# Patient Record
Sex: Male | Born: 1960
Health system: Southern US, Community
[De-identification: ages and names within clinical notes are randomized; demographics above are authoritative.]

## PROBLEM LIST (undated history)

## (undated) DIAGNOSIS — K7460 Unspecified cirrhosis of liver: Secondary | ICD-10-CM

## (undated) DIAGNOSIS — D696 Thrombocytopenia, unspecified: Secondary | ICD-10-CM

## (undated) DIAGNOSIS — I10 Essential (primary) hypertension: Secondary | ICD-10-CM

## (undated) DIAGNOSIS — M1990 Unspecified osteoarthritis, unspecified site: Secondary | ICD-10-CM

## (undated) DIAGNOSIS — K754 Autoimmune hepatitis: Secondary | ICD-10-CM

## (undated) HISTORY — PX: OTHER SURGICAL HISTORY: SHX169

## (undated) HISTORY — DX: Autoimmune hepatitis: K75.4

## (undated) HISTORY — DX: Thrombocytopenia, unspecified: D69.6

## (undated) HISTORY — DX: Essential (primary) hypertension: I10

## (undated) HISTORY — DX: Unspecified cirrhosis of liver: K74.60

## (undated) SURGERY — COLONOSCOPY
Anesthesia: Monitor Anesthesia Care

---

## 1998-10-09 ENCOUNTER — Emergency Department (HOSPITAL_COMMUNITY): Admission: EM | Admit: 1998-10-09 | Discharge: 1998-10-09 | Payer: Self-pay | Admitting: Emergency Medicine

## 1999-05-06 ENCOUNTER — Ambulatory Visit (HOSPITAL_COMMUNITY): Admission: RE | Admit: 1999-05-06 | Discharge: 1999-05-06 | Payer: Self-pay | Admitting: *Deleted

## 1999-12-06 ENCOUNTER — Encounter: Payer: Self-pay | Admitting: Emergency Medicine

## 1999-12-06 ENCOUNTER — Emergency Department (HOSPITAL_COMMUNITY): Admission: EM | Admit: 1999-12-06 | Discharge: 1999-12-06 | Payer: Self-pay | Admitting: Emergency Medicine

## 2003-02-04 ENCOUNTER — Encounter: Payer: Self-pay | Admitting: Emergency Medicine

## 2003-02-04 ENCOUNTER — Emergency Department (HOSPITAL_COMMUNITY): Admission: EM | Admit: 2003-02-04 | Discharge: 2003-02-04 | Payer: Self-pay | Admitting: Emergency Medicine

## 2004-12-16 ENCOUNTER — Emergency Department (HOSPITAL_COMMUNITY): Admission: EM | Admit: 2004-12-16 | Discharge: 2004-12-16 | Payer: Self-pay | Admitting: Emergency Medicine

## 2005-03-04 ENCOUNTER — Emergency Department (HOSPITAL_COMMUNITY): Admission: EM | Admit: 2005-03-04 | Discharge: 2005-03-04 | Payer: Self-pay | Admitting: Emergency Medicine

## 2005-07-24 ENCOUNTER — Emergency Department (HOSPITAL_COMMUNITY): Admission: EM | Admit: 2005-07-24 | Discharge: 2005-07-24 | Payer: Self-pay | Admitting: Emergency Medicine

## 2007-01-12 ENCOUNTER — Encounter: Admission: RE | Admit: 2007-01-12 | Discharge: 2007-01-12 | Payer: Self-pay | Admitting: Internal Medicine

## 2007-02-07 ENCOUNTER — Encounter: Admission: RE | Admit: 2007-02-07 | Discharge: 2007-02-07 | Payer: Self-pay | Admitting: Internal Medicine

## 2007-12-23 ENCOUNTER — Ambulatory Visit: Payer: Self-pay | Admitting: Gastroenterology

## 2007-12-23 DIAGNOSIS — B1910 Unspecified viral hepatitis B without hepatic coma: Secondary | ICD-10-CM | POA: Insufficient documentation

## 2007-12-23 DIAGNOSIS — R7401 Elevation of levels of liver transaminase levels: Secondary | ICD-10-CM | POA: Insufficient documentation

## 2007-12-23 DIAGNOSIS — R740 Nonspecific elevation of levels of transaminase and lactic acid dehydrogenase [LDH]: Secondary | ICD-10-CM

## 2007-12-26 LAB — CONVERTED CEMR LAB
AST: 1099 units/L — ABNORMAL HIGH (ref 0–37)
Alkaline Phosphatase: 164 units/L — ABNORMAL HIGH (ref 39–117)
Basophils Relative: 0.1 % (ref 0.0–1.0)
Bilirubin, Direct: 5.4 mg/dL — ABNORMAL HIGH (ref 0.0–0.3)
CO2: 27 meq/L (ref 19–32)
Eosinophils Relative: 8.4 % — ABNORMAL HIGH (ref 0.0–5.0)
Hemoglobin: 13.9 g/dL (ref 13.0–17.0)
INR: 1.4 — ABNORMAL HIGH (ref 0.8–1.0)
Lymphocytes Relative: 44 % (ref 12.0–46.0)
Monocytes Relative: 12.8 % — ABNORMAL HIGH (ref 3.0–12.0)
Neutro Abs: 1.9 10*3/uL (ref 1.4–7.7)
Prothrombin Time: 15.8 s — ABNORMAL HIGH (ref 10.9–13.3)
RBC: 4.73 M/uL (ref 4.22–5.81)
Total Bilirubin: 8.8 mg/dL — ABNORMAL HIGH (ref 0.3–1.2)
aPTT: 32.7 s — ABNORMAL HIGH (ref 21.7–29.8)

## 2008-01-02 LAB — CONVERTED CEMR LAB
AFP-Tumor Marker: 20.1 ng/mL — ABNORMAL HIGH (ref 0.0–8.0)
Anti Nuclear Antibody(ANA): POSITIVE — AB

## 2008-01-03 ENCOUNTER — Telehealth: Payer: Self-pay | Admitting: Gastroenterology

## 2008-01-05 ENCOUNTER — Ambulatory Visit (HOSPITAL_COMMUNITY): Admission: RE | Admit: 2008-01-05 | Discharge: 2008-01-05 | Payer: Self-pay | Admitting: Gastroenterology

## 2008-01-05 ENCOUNTER — Ambulatory Visit: Payer: Self-pay | Admitting: Gastroenterology

## 2008-01-05 ENCOUNTER — Telehealth (INDEPENDENT_AMBULATORY_CARE_PROVIDER_SITE_OTHER): Payer: Self-pay

## 2008-01-11 ENCOUNTER — Ambulatory Visit: Payer: Self-pay | Admitting: Gastroenterology

## 2008-01-11 LAB — CONVERTED CEMR LAB
Albumin ELP: 45.8 % — ABNORMAL LOW (ref 55.8–66.1)
Alpha-1-Globulin: 3.4 % (ref 2.9–4.9)
Beta Globulin: 3.8 % — ABNORMAL LOW (ref 4.7–7.2)
EBV NA IgG: 3.29 — ABNORMAL HIGH
EBV VCA IgM: 0.76
Gamma Globulin: 35.9 % — ABNORMAL HIGH (ref 11.1–18.8)

## 2008-01-12 ENCOUNTER — Encounter: Payer: Self-pay | Admitting: Gastroenterology

## 2008-01-12 ENCOUNTER — Telehealth: Payer: Self-pay | Admitting: Gastroenterology

## 2008-01-12 LAB — CONVERTED CEMR LAB
ALT: 495 units/L — ABNORMAL HIGH (ref 0–53)
AST: 700 units/L — ABNORMAL HIGH (ref 0–37)
Albumin: 2.3 g/dL — ABNORMAL LOW (ref 3.5–5.2)
Alkaline Phosphatase: 152 units/L — ABNORMAL HIGH (ref 39–117)
BUN: 11 mg/dL (ref 6–23)
Basophils Relative: 2 % — ABNORMAL HIGH (ref 0.0–1.0)
Bilirubin, Direct: 10.5 mg/dL — ABNORMAL HIGH (ref 0.0–0.3)
CO2: 25 meq/L (ref 19–32)
Calcium: 8.9 mg/dL (ref 8.4–10.5)
Chloride: 109 meq/L (ref 96–112)
Creatinine, Ser: 0.9 mg/dL (ref 0.4–1.5)
Eosinophils Relative: 14 % — ABNORMAL HIGH (ref 0.0–5.0)
Ferritin: 699.7 ng/mL — ABNORMAL HIGH (ref 22.0–322.0)
GFR calc Af Amer: 116 mL/min
GFR calc non Af Amer: 96 mL/min
Glucose, Bld: 120 mg/dL — ABNORMAL HIGH (ref 70–99)
HCT: 35.9 % — ABNORMAL LOW (ref 39.0–52.0)
Hemoglobin: 12.5 g/dL — ABNORMAL LOW (ref 13.0–17.0)
INR: 1.5 — ABNORMAL HIGH (ref 0.8–1.0)
Iron: 127 ug/dL (ref 42–165)
Lymphocytes Relative: 36 % (ref 12.0–46.0)
MCHC: 34.7 g/dL (ref 30.0–36.0)
MCV: 85 fL (ref 78.0–100.0)
Monocytes Relative: 6 % (ref 3.0–12.0)
Neutrophils Relative %: 42 % — ABNORMAL LOW (ref 43.0–77.0)
Platelets: 82 10*3/uL — ABNORMAL LOW (ref 150–400)
Potassium: 3.8 meq/L (ref 3.5–5.1)
Prothrombin Time: 17.3 s — ABNORMAL HIGH (ref 10.9–13.3)
RBC: 4.23 M/uL (ref 4.22–5.81)
RDW: 22.7 % — ABNORMAL HIGH (ref 11.5–14.6)
Saturation Ratios: 54.4 % — ABNORMAL HIGH (ref 20.0–50.0)
Sodium: 137 meq/L (ref 135–145)
TSH: 1.63 microintl units/mL (ref 0.35–5.50)
Total Bilirubin: 18.3 mg/dL — ABNORMAL HIGH (ref 0.3–1.2)
Total Protein: 7.4 g/dL (ref 6.0–8.3)
Transferrin: 166.8 mg/dL — ABNORMAL LOW (ref >=212.0)
WBC: 5.8 10*3/uL (ref 4.5–10.5)
aPTT: 31.6 s — ABNORMAL HIGH (ref 21.7–29.8)

## 2008-01-13 ENCOUNTER — Telehealth (INDEPENDENT_AMBULATORY_CARE_PROVIDER_SITE_OTHER): Payer: Self-pay | Admitting: *Deleted

## 2008-01-17 ENCOUNTER — Telehealth: Payer: Self-pay | Admitting: Gastroenterology

## 2008-01-17 ENCOUNTER — Telehealth (INDEPENDENT_AMBULATORY_CARE_PROVIDER_SITE_OTHER): Payer: Self-pay | Admitting: *Deleted

## 2008-01-18 ENCOUNTER — Encounter (INDEPENDENT_AMBULATORY_CARE_PROVIDER_SITE_OTHER): Payer: Self-pay | Admitting: *Deleted

## 2008-01-18 ENCOUNTER — Telehealth (INDEPENDENT_AMBULATORY_CARE_PROVIDER_SITE_OTHER): Payer: Self-pay | Admitting: *Deleted

## 2008-01-30 ENCOUNTER — Telehealth: Payer: Self-pay | Admitting: Gastroenterology

## 2008-01-30 ENCOUNTER — Telehealth (INDEPENDENT_AMBULATORY_CARE_PROVIDER_SITE_OTHER): Payer: Self-pay | Admitting: *Deleted

## 2008-02-07 ENCOUNTER — Encounter: Admission: RE | Admit: 2008-02-07 | Discharge: 2008-02-07 | Payer: Self-pay | Admitting: Nephrology

## 2010-05-08 ENCOUNTER — Emergency Department (HOSPITAL_COMMUNITY): Admission: EM | Admit: 2010-05-08 | Discharge: 2010-05-08 | Payer: Self-pay | Admitting: Emergency Medicine

## 2010-07-27 ENCOUNTER — Encounter: Payer: Self-pay | Admitting: Gastroenterology

## 2012-11-15 ENCOUNTER — Ambulatory Visit: Payer: BC Managed Care – PPO | Attending: Family Medicine | Admitting: Family Medicine

## 2012-11-15 VITALS — BP 146/84 | HR 73 | Temp 98.1°F | Resp 16 | Ht 72.5 in | Wt 233.0 lb

## 2012-11-15 DIAGNOSIS — K754 Autoimmune hepatitis: Secondary | ICD-10-CM

## 2012-11-15 DIAGNOSIS — I10 Essential (primary) hypertension: Secondary | ICD-10-CM | POA: Insufficient documentation

## 2012-11-15 MED ORDER — AZATHIOPRINE 50 MG PO TABS: 50.0000 mg | ORAL_TABLET | Freq: Every day | ORAL | Status: DC

## 2012-11-15 MED ORDER — AMLODIPINE BESYLATE 5 MG PO TABS: 5.0000 mg | ORAL_TABLET | Freq: Every day | ORAL | Status: DC

## 2012-11-15 NOTE — Progress Notes (Signed)
  Subjective:    Patient ID: Timothy [name], male    DOB: Mar 10, 1961, 52 y.o.   MRN: [id number]  Hypertension This is a chronic problem. The current episode started more than 1 year ago. The problem is unchanged (worse for 5 days, out of meds x 5 days). The problem is uncontrolled. Pertinent negatives include no chest pain, headaches, palpitations or shortness of breath. There are no associated agents to hypertension. Risk factors for coronary artery disease include obesity and male gender. Past treatments include calcium channel blockers. The current treatment provides moderate improvement.      Review of Systems  Respiratory: Negative for shortness of breath.   Cardiovascular: Negative for chest pain, palpitations and leg swelling.  Neurological: Negative for headaches.  All other systems reviewed and are negative.       Objective:   Physical Exam  Nursing note and vitals reviewed. Constitutional: He is oriented to person, place, and time. He appears well-developed and well-nourished. No distress.  HENT:  Head: Normocephalic and atraumatic.  Nose: Nose normal.  Mouth/Throat: No oropharyngeal exudate.  Poor dentition  Eyes: Conjunctivae and EOM are normal. Pupils are equal, round, and reactive to light. No scleral icterus.  Neck: Normal range of motion. Neck supple. No tracheal deviation present. No thyromegaly present.  Cardiovascular: Normal rate, regular rhythm and normal heart sounds.   Pulmonary/Chest: Effort normal and breath sounds normal.  Abdominal: Soft. Bowel sounds are normal. He exhibits no distension and no mass. There is no tenderness. There is no guarding.  Musculoskeletal: Normal range of motion. He exhibits no edema and no tenderness.  Neurological: He is alert and oriented to person, place, and time. He has normal reflexes.  Skin: Skin is warm and dry. No erythema.  Psychiatric: He has a normal mood and affect. His behavior is normal. Judgment and thought content  normal.         Assessment & Plan:  Unspecified essential hypertension - Plan: Obtain medical records, amLODipine (NORVASC) 5 MG tablet  Essential hypertension, benign - Plan: CBC with Differential, Comprehensive metabolic panel, Lipid Panel, Hemoglobin A1C, Vitamin D, 25-hydroxy, Urinalysis Dipstick  Autoimmune hepatitis - Plan: azaTHIOprine (IMURAN) 50 MG tablet   Refilled medications today and sent for old records.   Follow lab results.  The patient was given clear instructions to go to ER or return to medical center if symptoms don't improve, worsen or new problems develop.  The patient verbalized understanding.  The patient was told to call to get lab results if they haven't heard anything in the next week.     Rodney Langton, MD, CDE, FAAFP [facility] [place], [place]

## 2012-11-15 NOTE — Progress Notes (Signed)
Patient stated that he would have to check his work schedule before scheduling return visit in 6 weeks.

## 2012-11-15 NOTE — Patient Instructions (Signed)
Arterial Hypertension Arterial hypertension (high blood pressure) is a condition of elevated pressure in your blood vessels. Hypertension over a long period of time is a risk factor for strokes, heart attacks, and heart failure. It is also the leading cause of kidney (renal) failure.  CAUSES   In Adults -- Over 90% of all hypertension has no known cause. This is called essential or primary hypertension. In the other 10% of people with hypertension, the increase in blood pressure is caused by another disorder. This is called secondary hypertension. Important causes of secondary hypertension are:  Heavy alcohol use.  Obstructive sleep apnea.  Hyperaldosterosim (Conn's syndrome).  Steroid use.  Chronic kidney failure.  Hyperparathyroidism.  Medications.  Renal artery stenosis.  Pheochromocytoma.  Cushing's disease.  Coarctation of the aorta.  Scleroderma renal crisis.  Licorice (in excessive amounts).  Drugs (cocaine, methamphetamine). Your caregiver can explain any items above that apply to you.  In Children -- Secondary hypertension is more common and should always be considered.  Pregnancy -- Few women of childbearing age have high blood pressure. However, up to 10% of them develop hypertension of pregnancy. Generally, this will not harm the woman. It may be a sign of 3 complications of pregnancy: preeclampsia, HELLP syndrome, and eclampsia. Follow up and control with medication is necessary. SYMPTOMS   This condition normally does not produce any noticeable symptoms. It is usually found during a routine exam.  Malignant hypertension is a late problem of high blood pressure. It may have the following symptoms:  Headaches.  Blurred vision.  End-organ damage (this means your kidneys, heart, lungs, and other organs are being damaged).  Stressful situations can increase the blood pressure. If a person with normal blood pressure has their blood pressure go up while being  seen by their caregiver, this is often termed "white coat hypertension." Its importance is not known. It may be related with eventually developing hypertension or complications of hypertension.  Hypertension is often confused with mental tension, stress, and anxiety. DIAGNOSIS  The diagnosis is made by 3 separate blood pressure measurements. They are taken at least 1 week apart from each other. If there is organ damage from hypertension, the diagnosis may be made without repeat measurements. Hypertension is usually identified by having blood pressure readings:  Above 140/90 mmHg measured in both arms, at 3 separate times, over a couple weeks.  Over 130/80 mmHg should be considered a risk factor and may require treatment in patients with diabetes. Blood pressure readings over 120/80 mmHg are called "pre-hypertension" even in non-diabetic patients. To get a true blood pressure measurement, use the following guidelines. Be aware of the factors that can alter blood pressure readings.  Take measurements at least 1 hour after caffeine.  Take measurements 30 minutes after smoking and without any stress. This is another reason to quit smoking  it raises your blood pressure.  Use a proper cuff size. Ask your caregiver if you are not sure about your cuff size.  Most home blood pressure cuffs are automatic. They will measure systolic and diastolic pressures. The systolic pressure is the pressure reading at the start of sounds. Diastolic pressure is the pressure at which the sounds disappear. If you are elderly, measure pressures in multiple postures. Try sitting, lying or standing.  Sit at rest for a minimum of 5 minutes before taking measurements.  You should not be on any medications like decongestants. These are found in many cold medications.  Record your blood pressure readings and review  them with your caregiver. If you have hypertension:  Your caregiver may do tests to be sure you do not have  secondary hypertension (see "causes" above).  Your caregiver may also look for signs of metabolic syndrome. This is also called Syndrome X or Insulin Resistance Syndrome. You may have this syndrome if you have type 2 diabetes, abdominal obesity, and abnormal blood lipids in addition to hypertension.  Your caregiver will take your medical and family history and perform a physical exam.  Diagnostic tests may include blood tests (for glucose, cholesterol, potassium, and kidney function), a urinalysis, or an EKG. Other tests may also be necessary depending on your condition. PREVENTION  There are important lifestyle issues that you can adopt to reduce your chance of developing hypertension:  Maintain a normal weight.  Limit the amount of salt (sodium) in your diet.  Exercise often.  Limit alcohol intake.  Get enough potassium in your diet. Discuss specific advice with your caregiver.  Follow a DASH diet (dietary approaches to stop hypertension). This diet is rich in fruits, vegetables, and low-fat dairy products, and avoids certain fats. PROGNOSIS  Essential hypertension cannot be cured. Lifestyle changes and medical treatment can lower blood pressure and reduce complications. The prognosis of secondary hypertension depends on the underlying cause. Many people whose hypertension is controlled with medicine or lifestyle changes can live a normal, healthy life.  RISKS AND COMPLICATIONS  While high blood pressure alone is not an illness, it often requires treatment due to its short- and long-term effects on many organs. Hypertension increases your risk for:  CVAs or strokes (cerebrovascular accident).  Heart failure due to chronically high blood pressure (hypertensive cardiomyopathy).  Heart attack (myocardial infarction).  Damage to the retina (hypertensive retinopathy).  Kidney failure (hypertensive nephropathy). Your caregiver can explain list items above that apply to you. Treatment  of hypertension can significantly reduce the risk of complications. TREATMENT   For overweight patients, weight loss and regular exercise are recommended. Physical fitness lowers blood pressure.  Mild hypertension is usually treated with diet and exercise. A diet rich in fruits and vegetables, fat-free dairy products, and foods low in fat and salt (sodium) can help lower blood pressure. Decreasing salt intake decreases blood pressure in a 1/3 of people.  Stop smoking if you are a smoker. The steps above are highly effective in reducing blood pressure. While these actions are easy to suggest, they are difficult to achieve. Most patients with moderate or severe hypertension end up requiring medications to bring their blood pressure down to a normal level. There are several classes of medications for treatment. Blood pressure pills (antihypertensives) will lower blood pressure by their different actions. Lowering the blood pressure by 10 mmHg may decrease the risk of complications by as much as 25%. The goal of treatment is effective blood pressure control. This will reduce your risk for complications. Your caregiver will help you determine the best treatment for you according to your lifestyle. What is excellent treatment for one person, may not be for you. HOME CARE INSTRUCTIONS   Do not smoke.  Follow the lifestyle changes outlined in the "Prevention" section.  If you are on medications, follow the directions carefully. Blood pressure medications must be taken as prescribed. Skipping doses reduces their benefit. It also puts you at risk for problems.  Follow up with your caregiver, as directed.  If you are asked to monitor your blood pressure at home, follow the guidelines in the "Diagnosis" section above. SEEK MEDICAL CARE  IF:   You think you are having medication side effects.  You have recurrent headaches or lightheadedness.  You have swelling in your ankles.  You have trouble with  your vision. SEEK IMMEDIATE MEDICAL CARE IF:   You have sudden onset of chest pain or pressure, difficulty breathing, or other symptoms of a heart attack.  You have a severe headache.  You have symptoms of a stroke (such as sudden weakness, difficulty speaking, difficulty walking). MAKE SURE YOU:   Understand these instructions.  Will watch your condition.  Will get help right away if you are not doing well or get worse. Document Released: 06/22/2005 Document Revised: 09/14/2011 Document Reviewed: 01/20/2007 [facility] Patient Information 2013 [place], [place]. Autoimmune Hepatitis Autoimmune hepatitis is a disease in which the body's immune system attacks liver cells. The immune system is the system in your body which fights off disease. When the body's immune system attacks the liver, the liver becomes inflamed and causes hepatitis. Researchers think a genetic factor (passed from parent to child) may make it more likely for some people to have autoimmune diseases. About 70% of those with autoimmune hepatitis are women. Most people with autoimmune hepatitis are between the ages of 92 and 17, but the disease may happen at any age. The disease is usually serious. If not treated, it will get worse over time, and it can last for years. It can lead to scarring and hardening of the liver (cirrhosis), and it can lead to liver failure. Autoimmune hepatitis is classified as either type 1 or 2. Type 1 is the most common form in [place]. It occurs at any age and is more common among women. About half of those with type 1 have other autoimmune disorders, such as type 1 diabetes, proliferative glomerulonephritis, thyroiditis, Graves' disease, Sjgren's syndrome, autoimmune anemia, and ulcerative colitis. Your caregiver can tell you about these disorders and if they apply to you. Type 2 autoimmune hepatitis is less common. It usually affects girls ages 2 to 61, but adults can have it, too. AUTOIMMUNE  DISEASE One job of the immune system is to protect the body from viruses, bacteria, and other living organisms. Usually, the immune system does not react against the body's own cells. Occasionally, the immune system mistakenly attacks the cells it is supposed to protect, and when this happens, the response is called autoimmunity. Researchers think that certain bacteria, viruses, toxins, and drugs trigger an autoimmune response in people who are genetically susceptible to developing an autoimmune disorder. This means it could be a tendency you inherited from your parents. SYMPTOMS  Fatigue is probably the most common symptom of this disease. Other symptoms include:  Enlarged liver.  Jaundice.  Itching.  Skin rashes.  Joint pain.  Abdominal discomfort.  Abnormal blood vessels (spider angiomas) on the skin.  Nausea.  Vomiting.  Loss of appetite.  Dark urine.  Pale or gray colored stools. People in advanced stages of the disease are more likely to have symptoms such as fluid in the abdomen (ascites) or mental confusion. Women may stop having menstrual periods.  Symptoms of autoimmune hepatitis range from mild to severe. Hepatitis caused by a virus or certain medications may have similar symptoms as autoimmune hepatitis, so tests may be needed for an exact diagnosis. Your caregiver will also review all your medicines before diagnosing autoimmune hepatitis. DIAGNOSIS  Your caregiver will make a diagnosis based on your symptoms, blood tests, and liver biopsy.  Blood tests. A routine blood test for liver enzymes  can help show a common pattern of hepatitis.  Liver biopsy. A tiny sample of your liver tissue will be examined under a microscope. It can help your caregiver diagnose autoimmune hepatitis and tell how serious it is. You will go to a hospital or outpatient surgical facility for this procedure. TREATMENT  Treatment works best when autoimmune hepatitis is discovered early. With  proper treatment, this disease can usually be controlled. Recent studies show that continued response to treatment not only stops the disease from getting worse, but also may actually reverse some of the damage. The primary treatment is medicine to slow down (suppress) an overactive immune system. Both types of autoimmune hepatitis are treated with daily doses of a corticosteroid. This is also called prednisone. Your caregiver may start you on a high dose (20 to 60 mg per day) and lower the dose to 5 to 15 mg per day as the disease is controlled. The goal is to find the lowest possible dose that will control your disease. Another medicine, azathioprine, is also used to treat autoimmune hepatitis. Like prednisone, azathioprine suppresses the immune system, but in a different way. It helps lower the dose of prednisone needed, which reduces its side effects. Your caregiver may prescribe azathioprine, in addition to prednisone, once your disease is under control. Most people will need to take prednisone, with or without azathioprine, for years. Some people take it for life. These medicines may slow down the disease, but everyone is different. In about 1 out of every 3 people, treatment can eventually be stopped. After stopping, it is important to carefully monitor your condition. Promptly report any new symptoms to your caregiver. The disease may return and be even more severe. This is especially true during the first few months after stopping treatment. In about 7 out of 10 people, the disease goes into an inactive state (remission) within 2 years of starting treatment. During that time the symptoms are less severe. Some persons with a remission will see the disease return within 3 years, so treatment may be necessary on and off for years, and possibly for life. People with autoimmune hepatitis should also be vaccinated against hepatitis A virus and hepatitis B virus. Vaccination should be done as soon as possible  after the diagnosis is made. Side Effects Both prednisone and azathioprine have side effects. Because high doses of prednisone are needed to control this disease, managing side effects is very important. Most side effects appear only after a long period of time. Some possible side effects of prednisone are:  Weight gain.  Thinning of the bones (osteoporosis).  Diabetes.  Cataracts.  Acne.  Anxiety and confusion.  Thinning of the hair and skin.  High blood pressure.  Glaucoma.  Rounded face. Azathioprine can lower your white blood cell count; these are the cells that fight infection. Sometimes it causes nausea and poor appetite. Rare side effects are:  Allergic reaction.  Liver damage.  Inflammation of the pancreas gland with severe stomach pain (pancreatitis).  More susceptible to infection.  Diarrhea. People who do not get better with standard immune therapy or who have severe side effects may be helped by other medicine. People who progress to end stage liver disease (liver failure) or cirrhosis or both may need a liver transplant. Transplantation has a 1-year survival rate of 90% and a 5-year survival rate of 70% to 80%. FOR MORE INFORMATION  American Liver Foundation (ALF): www.liverfoundation.org National Digestive Diseases Information Clearinghouse: CheatPrevention.com.au Document Released: 09/12/2003 Document Revised: 09/14/2011 Document  Reviewed: 04/24/2009 [facility] Patient Information 2013 [place], [place]. DASH Diet The DASH diet stands for "Dietary Approaches to Stop Hypertension." It is a healthy eating plan that has been shown to reduce high blood pressure (hypertension) in as little as 14 days, while also possibly providing other significant health benefits. These other health benefits include reducing the risk of breast cancer after menopause and reducing the risk of type 2 diabetes, heart disease, colon cancer, and stroke. Health benefits also include weight loss  and slowing kidney failure in patients with chronic kidney disease.  DIET GUIDELINES  Limit salt (sodium). Your diet should contain less than 1500 mg of sodium daily.  Limit refined or processed carbohydrates. Your diet should include mostly whole grains. Desserts and added sugars should be used sparingly.  Include small amounts of heart-healthy fats. These types of fats include nuts, oils, and tub margarine. Limit saturated and trans fats. These fats have been shown to be harmful in the body. CHOOSING FOODS  The following food groups are based on a 2000 calorie diet. See your Registered Dietitian for individual calorie needs. Grains and Grain Products (6 to 8 servings daily)  Eat More Often: Whole-wheat bread, brown rice, whole-grain or wheat pasta, quinoa, popcorn without added fat or salt (air popped).  Eat Less Often: White bread, white pasta, white rice, cornbread. Vegetables (4 to 5 servings daily)  Eat More Often: Fresh, frozen, and canned vegetables. Vegetables may be raw, steamed, roasted, or grilled with a minimal amount of fat.  Eat Less Often/Avoid: Creamed or fried vegetables. Vegetables in a cheese sauce. Fruit (4 to 5 servings daily)  Eat More Often: All fresh, canned (in natural juice), or frozen fruits. Dried fruits without added sugar. One hundred percent fruit juice ( cup [237 mL] daily).  Eat Less Often: Dried fruits with added sugar. Canned fruit in light or heavy syrup. [employer], Fish, and Poultry (2 servings or less daily. One serving is 3 to 4 oz [85-114 g]).  Eat More Often: Ninety percent or leaner ground beef, tenderloin, sirloin. Round cuts of beef, chicken breast, [place] breast. All fish. Grill, bake, or broil your meat. Nothing should be fried.  Eat Less Often/Avoid: Fatty cuts of meat, [place], or chicken leg, thigh, or wing. Fried cuts of meat or fish. Dairy (2 to 3 servings)  Eat More Often: Low-fat or fat-free milk, low-fat plain or light yogurt,  reduced-fat or part-skim cheese.  Eat Less Often/Avoid: Milk (whole, 2%).Whole milk yogurt. Full-fat cheeses. Nuts, Seeds, and Legumes (4 to 5 servings per week)  Eat More Often: All without added salt.  Eat Less Often/Avoid: Salted nuts and seeds, canned beans with added salt. Fats and Sweets (limited)  Eat More Often: Vegetable oils, tub margarines without trans fats, sugar-free gelatin. Mayonnaise and salad dressings.  Eat Less Often/Avoid: Coconut oils, palm oils, butter, stick margarine, cream, half and half, cookies, candy, pie. FOR MORE INFORMATION The Dash Diet Eating Plan: www.dashdiet.org Document Released: 06/11/2011 Document Revised: 09/14/2011 Document Reviewed: 06/11/2011 [facility] Patient Information 2013 [place], [place].

## 2012-11-15 NOTE — Progress Notes (Signed)
Patient states his PCP retired and now needs to establish care and get his hypertensive medications refilled. Patient states his blood pressure has been elevated the past 4 days and has been out of his Rx the past 4 days.

## 2012-11-16 LAB — CBC WITH DIFFERENTIAL/PLATELET
Basophils Absolute: 0 10*3/uL (ref 0.0–0.1)
HCT: 41.9 % (ref 39.0–52.0)
Hemoglobin: 14 g/dL (ref 13.0–17.0)
Lymphocytes Relative: 26 % (ref 12–46)
Monocytes Absolute: 0.6 10*3/uL (ref 0.1–1.0)
Monocytes Relative: 17 % — ABNORMAL HIGH (ref 3–12)
Neutro Abs: 1.5 10*3/uL — ABNORMAL LOW (ref 1.7–7.7)
Neutrophils Relative %: 46 % (ref 43–77)
RDW: 15.3 % (ref 11.5–15.5)
WBC: 3.3 10*3/uL — ABNORMAL LOW (ref 4.0–10.5)

## 2012-11-16 LAB — COMPREHENSIVE METABOLIC PANEL
AST: 43 U/L — ABNORMAL HIGH (ref 0–37)
Albumin: 3.2 g/dL — ABNORMAL LOW (ref 3.5–5.2)
Alkaline Phosphatase: 92 U/L (ref 39–117)
BUN: 13 mg/dL (ref 6–23)
Calcium: 9.1 mg/dL (ref 8.4–10.5)
Chloride: 108 mEq/L (ref 96–112)
Potassium: 3.9 mEq/L (ref 3.5–5.3)
Sodium: 139 mEq/L (ref 135–145)
Total Protein: 6.8 g/dL (ref 6.0–8.3)

## 2012-11-16 LAB — VITAMIN D 25 HYDROXY (VIT D DEFICIENCY, FRACTURES): Vit D, 25-Hydroxy: 22 ng/mL — ABNORMAL LOW (ref 30–89)

## 2012-11-16 LAB — LIPID PANEL
HDL: 63 mg/dL (ref >39)
LDL Cholesterol: 109 mg/dL — ABNORMAL HIGH (ref 0–99)

## 2012-11-16 LAB — HEMOGLOBIN A1C
Hgb A1c MFr Bld: 5.6 % (ref <5.7)
Mean Plasma Glucose: 114 mg/dL (ref <117)

## 2012-11-17 NOTE — Progress Notes (Signed)
Quick Note:  Please inform patient that his labs reveal that his platelets are low but stable from 4 years ago. His vitamin D came back low. Recommend that he start over the counter Vitamin D 1000 IU caps po daily. Other labs came back OK.   Rodney Langton, MD, CDE, FAAFP [facility] [place], [place]   ______

## 2012-11-18 ENCOUNTER — Telehealth: Payer: Self-pay

## 2012-11-18 NOTE — Telephone Encounter (Signed)
Patient not available Left message on machine to return call  

## 2012-11-21 ENCOUNTER — Telehealth: Payer: Self-pay

## 2012-11-21 NOTE — Telephone Encounter (Signed)
Patient not avail Left message to return call

## 2013-03-14 ENCOUNTER — Ambulatory Visit: Payer: Self-pay | Attending: Internal Medicine | Admitting: Internal Medicine

## 2013-03-14 VITALS — BP 137/92 | HR 61 | Temp 98.2°F | Resp 16 | Wt 237.0 lb

## 2013-03-14 DIAGNOSIS — Z760 Encounter for issue of repeat prescription: Secondary | ICD-10-CM | POA: Insufficient documentation

## 2013-03-14 DIAGNOSIS — K754 Autoimmune hepatitis: Secondary | ICD-10-CM | POA: Insufficient documentation

## 2013-03-14 DIAGNOSIS — I10 Essential (primary) hypertension: Secondary | ICD-10-CM | POA: Insufficient documentation

## 2013-03-14 DIAGNOSIS — E559 Vitamin D deficiency, unspecified: Secondary | ICD-10-CM | POA: Insufficient documentation

## 2013-03-14 LAB — CBC
HCT: 45.6 % (ref 39.0–52.0)
Hemoglobin: 15 g/dL (ref 13.0–17.0)
MCH: 28.5 pg (ref 26.0–34.0)
MCHC: 32.9 g/dL (ref 30.0–36.0)

## 2013-03-14 LAB — COMPREHENSIVE METABOLIC PANEL
Alkaline Phosphatase: 111 U/L (ref 39–117)
BUN: 14 mg/dL (ref 6–23)
Creat: 0.97 mg/dL (ref 0.50–1.35)
Glucose, Bld: 95 mg/dL (ref 70–99)
Total Bilirubin: 0.7 mg/dL (ref 0.3–1.2)

## 2013-03-14 MED ORDER — AZATHIOPRINE 50 MG PO TABS: 50.0000 mg | ORAL_TABLET | Freq: Every day | ORAL | Status: DC

## 2013-03-14 MED ORDER — AMLODIPINE BESYLATE 5 MG PO TABS: 5.0000 mg | ORAL_TABLET | Freq: Every day | ORAL | Status: DC

## 2013-03-14 NOTE — Progress Notes (Signed)
Patient ID: Timothy [name], male   DOB: [birth year], 52 y.o.   MRN: [id number] PCP:  Jeanann Lewandowsky, MD    Chief Complaint:  Medication refill  HPI: 52 year old male with history of hypertension and autoimmune hepatitis here for medication refill. Patient reports being admitted to [facility] about 2 months back for some "liver problem "she does not remember exactly. He has been on low-dose amlodipine for his blood pressure and Imuran for autoimmune hepatitis. Patient denies any headache, dizziness, jaundice, fever, chills, blurred vision, chest pain, palpitations, shortness of breath, abdominal pain, nausea, vomiting, cough, hemoptysis ,  hematemesis or melena, bowel or urinary symptoms. Denies any joint pain . Reports occasional leg swellings.  denies any change in his weight or appetite.  Allergies: No Known Allergies  Prior to Admission medications   Medication Sig Start Date End Date Taking? Authorizing Provider  amLODipine (NORVASC) 5 MG tablet Take 1 tablet (5 mg total) by mouth daily. 03/14/13   Leeta Grimme, MD  azaTHIOprine (IMURAN) 50 MG tablet Take 1 tablet (50 mg total) by mouth daily. 03/14/13   Eddie North, MD    Past Medical History  Diagnosis Date  . Hypertension     History reviewed. No pertinent past surgical history.  Social History:  reports that he has never smoked. He does not have any smokeless tobacco history on file. He reports that he does not drink alcohol or use illicit drugs.  History reviewed. No pertinent family history.  Review of Systems:  As outlined in history of present illness   Physical Exam:  Filed Vitals:   03/14/13 0911  BP: 137/92  Pulse: 61  Temp: 98.2 F (36.8 C)  Resp: 16  Weight: 237 lb (107.502 kg)  SpO2: 100%    Constitutional: Vital signs reviewed.  Patient is a well-developed and well-nourished in no acute distress and cooperative with exam.  HEENT: No pallor, no icterus, moist oral mucosa Cardiovascular: RRR, S1  normal, S2 normal, no MRG, pulses symmetric and intact bilaterally Pulmonary/Chest: CTAB, no wheezes, rales, or rhonchi Abdominal: Soft. Non-tender, non-distended, bowel sounds are normal, no masses, organomegaly, or guarding present.   extremities: Warm, trace edema CNS: AAO x3 Labs on Admission:  No results found for this or any previous visit (from the past 48 hour(s)).  Radiological Exams on Admission: No results found.  Assessment/Plan Hypertension Pressure mildly elevated. Patient reports he has not taken his medications today. I will give him a refill of his amlodipine and have him come back in 2 weeks to have his blood pressure checked. It is still elevated he is medications need to be adjusted.  Autoimmune hepatitis Patient on Imuran chronically.  refill provided. We'll check LFTs and CBC given the recent hospitalization for? Liver problems. Review chronically low platelets as well  Low vitamin D level Patient was called in for prescription for vitamin D during last visit but he reports he is not on the medication and is not interested in taking them.  Follow up in 2 weeks for blood pressure monitoring and evaluate blood work  [employer], Naoma Boxell 03/14/2013, 9:25 AM

## 2013-03-14 NOTE — Progress Notes (Signed)
Patient here for medication refill.

## 2013-03-15 ENCOUNTER — Ambulatory Visit: Payer: Self-pay | Attending: Family Medicine

## 2013-03-28 ENCOUNTER — Ambulatory Visit: Payer: Self-pay | Attending: Family Medicine

## 2013-03-28 DIAGNOSIS — K754 Autoimmune hepatitis: Secondary | ICD-10-CM | POA: Insufficient documentation

## 2013-03-28 MED ORDER — NEBIVOLOL HCL 10 MG PO TABS: 10.0000 mg | ORAL_TABLET | Freq: Every day | ORAL | Status: DC

## 2013-03-28 MED ORDER — HYDROCHLOROTHIAZIDE 25 MG PO TABS: 25.0000 mg | ORAL_TABLET | Freq: Every day | ORAL | Status: DC

## 2013-04-19 ENCOUNTER — Other Ambulatory Visit: Payer: Self-pay | Admitting: Internal Medicine

## 2013-04-19 MED ORDER — CARVEDILOL 6.25 MG PO TABS: 6.2500 mg | ORAL_TABLET | Freq: Two times a day (BID) | ORAL | Status: DC

## 2013-04-27 ENCOUNTER — Ambulatory Visit: Payer: Self-pay | Admitting: Internal Medicine

## 2013-05-15 ENCOUNTER — Other Ambulatory Visit: Payer: Self-pay | Admitting: Internal Medicine

## 2013-05-15 MED ORDER — CARVEDILOL 6.25 MG PO TABS: 6.2500 mg | ORAL_TABLET | Freq: Two times a day (BID) | ORAL | Status: DC

## 2013-05-18 ENCOUNTER — Ambulatory Visit: Payer: Self-pay

## 2013-08-02 ENCOUNTER — Other Ambulatory Visit: Payer: Self-pay | Admitting: Internal Medicine

## 2013-08-02 MED ORDER — NEBIVOLOL HCL 10 MG PO TABS: 10.0000 mg | ORAL_TABLET | Freq: Every day | ORAL | Status: DC

## 2013-10-03 ENCOUNTER — Other Ambulatory Visit: Payer: Self-pay | Admitting: Internal Medicine

## 2013-10-03 MED ORDER — NEBIVOLOL HCL 10 MG PO TABS: 10.0000 mg | ORAL_TABLET | Freq: Every day | ORAL | Status: DC

## 2014-01-03 ENCOUNTER — Other Ambulatory Visit: Payer: Self-pay | Admitting: Internal Medicine

## 2014-01-03 MED ORDER — NEBIVOLOL HCL 10 MG PO TABS: 10.0000 mg | ORAL_TABLET | Freq: Every day | ORAL | Status: DC

## 2014-06-18 ENCOUNTER — Other Ambulatory Visit: Payer: Self-pay | Admitting: Internal Medicine

## 2014-07-03 ENCOUNTER — Other Ambulatory Visit: Payer: Self-pay | Admitting: Emergency Medicine

## 2014-07-03 MED ORDER — NEBIVOLOL HCL 10 MG PO TABS: 10.0000 mg | ORAL_TABLET | Freq: Every day | ORAL | Status: DC

## 2015-03-21 ENCOUNTER — Emergency Department (INDEPENDENT_AMBULATORY_CARE_PROVIDER_SITE_OTHER)
Admission: EM | Admit: 2015-03-21 | Discharge: 2015-03-21 | Disposition: A | Discharge status: Home / Self Care | Payer: Self-pay | Source: Home / Self Care | Attending: Family Medicine | Admitting: Family Medicine

## 2015-03-21 ENCOUNTER — Encounter (HOSPITAL_COMMUNITY): Payer: Self-pay | Admitting: *Deleted

## 2015-03-21 DIAGNOSIS — I10 Essential (primary) hypertension: Secondary | ICD-10-CM

## 2015-03-21 LAB — POCT I-STAT, CHEM 8
BUN: 14 mg/dL (ref 6–20)
CALCIUM ION: 1.16 mmol/L (ref 1.12–1.23)
Chloride: 103 mmol/L (ref 101–111)
Creatinine, Ser: 1 mg/dL (ref 0.61–1.24)
Glucose, Bld: 185 mg/dL — ABNORMAL HIGH (ref 65–99)
HCT: 49 % (ref 39.0–52.0)
Hemoglobin: 16.7 g/dL (ref 13.0–17.0)
Potassium: 4.5 mmol/L (ref 3.5–5.1)
SODIUM: 141 mmol/L (ref 135–145)
TCO2: 26 mmol/L (ref 0–100)

## 2015-03-21 MED ORDER — CARVEDILOL 6.25 MG PO TABS: 6.2500 mg | ORAL_TABLET | Freq: Two times a day (BID) | ORAL | Status: DC

## 2015-03-21 MED ORDER — NEBIVOLOL HCL 10 MG PO TABS: 10.0000 mg | ORAL_TABLET | Freq: Every day | ORAL | Status: DC

## 2015-03-21 NOTE — ED Provider Notes (Addendum)
CSN: [id number]     Arrival date & time 03/21/15  1525 History   None    Chief Complaint  Patient presents with  . Hypertension   (Consider location/radiation/quality/duration/timing/severity/associated sxs/prior Treatment) Patient is a 54 y.o. male presenting with hypertension. The history is provided by the patient and the spouse.  Hypertension This is a chronic problem. The current episode started more than 1 week ago (has run out of bp meds.). The problem has not changed since onset.Pertinent negatives include no chest pain, no abdominal pain, no headaches and no shortness of breath.    Past Medical History  Diagnosis Date  . Hypertension    History reviewed. No pertinent past surgical history. History reviewed. No pertinent family history. Social History  Substance Use Topics  . Smoking status: Never Smoker   . Smokeless tobacco: None  . Alcohol Use: No    Review of Systems  Constitutional: Negative.   Respiratory: Negative for shortness of breath.   Cardiovascular: Negative for chest pain, palpitations and leg swelling.  Gastrointestinal: Negative for abdominal pain.  Neurological: Negative for dizziness and headaches.  All other systems reviewed and are negative.   Allergies  Review of patient's allergies indicates no known allergies.  Home Medications   Prior to Admission medications   Medication Sig Start Date End Date Taking? Authorizing Provider  azaTHIOprine (IMURAN) 50 MG tablet Take 1 tablet (50 mg total) by mouth daily. 03/14/13   Nishant Dhungel, MD  carvedilol (COREG) 6.25 MG tablet Take 1 tablet (6.25 mg total) by mouth 2 (two) times daily with a meal. 03/21/15   Billy Fischer, MD  nebivolol (BYSTOLIC) 10 MG tablet Take 1 tablet (10 mg total) by mouth daily. 03/21/15   Billy Fischer, MD   Meds Ordered and Administered this Visit  Medications - No data to display  BP 203/126 mmHg  Pulse 72  Temp(Src) 98.5 F (36.9 C) (Oral)  Resp 16  SpO2 100% No  data found.   Physical Exam  Constitutional: He is oriented to person, place, and time. He appears well-developed and well-nourished.  Neck: Normal range of motion. Neck supple.  Cardiovascular: Normal heart sounds.   Pulmonary/Chest: Effort normal and breath sounds normal.  Musculoskeletal: He exhibits no edema.  Lymphadenopathy:    He has no cervical adenopathy.  Neurological: He is alert and oriented to person, place, and time.  Skin: Skin is warm and dry.  Nursing note and vitals reviewed.   ED Course  Procedures (including critical care time)  Labs Review Labs Reviewed  POCT I-STAT, CHEM 8 - Abnormal; Notable for the following:    Glucose, Bld 185 (*)    All other components within normal limits   i-stat wnl x bs 185 Imaging Review No results found.   Visual Acuity Review  Right Eye Distance:   Left Eye Distance:   Bilateral Distance:    Right Eye Near:   Left Eye Near:    Bilateral Near:         MDM   1. Essential hypertension        Billy Fischer, MD 03/21/15 [place], MD 03/21/15 [phone]

## 2015-03-21 NOTE — ED Notes (Signed)
Pt reports    Symptoms  Of      High  Blood    Pressure            He  Is  Out of  His   bp  meds       He   Reports he  Gets  His meds  At  [facility]

## 2015-03-21 NOTE — Discharge Instructions (Signed)
See your doctor as planned. °

## 2015-03-29 ENCOUNTER — Ambulatory Visit: Payer: Self-pay

## 2015-03-29 ENCOUNTER — Telehealth: Payer: Self-pay | Admitting: Internal Medicine

## 2015-03-29 ENCOUNTER — Ambulatory Visit: Payer: Self-pay | Attending: Internal Medicine | Admitting: Internal Medicine

## 2015-03-29 ENCOUNTER — Encounter: Payer: Self-pay | Admitting: Internal Medicine

## 2015-03-29 VITALS — BP 160/118 | HR 58 | Temp 98.0°F | Resp 16 | Ht 77.0 in | Wt 217.2 lb

## 2015-03-29 DIAGNOSIS — R739 Hyperglycemia, unspecified: Secondary | ICD-10-CM

## 2015-03-29 DIAGNOSIS — K754 Autoimmune hepatitis: Secondary | ICD-10-CM

## 2015-03-29 DIAGNOSIS — IMO0001 Reserved for inherently not codable concepts without codable children: Secondary | ICD-10-CM

## 2015-03-29 DIAGNOSIS — R7309 Other abnormal glucose: Secondary | ICD-10-CM | POA: Insufficient documentation

## 2015-03-29 DIAGNOSIS — I10 Essential (primary) hypertension: Secondary | ICD-10-CM | POA: Insufficient documentation

## 2015-03-29 DIAGNOSIS — R030 Elevated blood-pressure reading, without diagnosis of hypertension: Secondary | ICD-10-CM | POA: Insufficient documentation

## 2015-03-29 LAB — HEPATIC FUNCTION PANEL
ALT: 36 U/L (ref 9–46)
AST: 51 U/L — ABNORMAL HIGH (ref 10–35)
Albumin: 3.4 g/dL — ABNORMAL LOW (ref 3.6–5.1)
Alkaline Phosphatase: 89 U/L (ref 40–115)
BILIRUBIN DIRECT: 0.2 mg/dL (ref <=0.2)
BILIRUBIN INDIRECT: 0.6 mg/dL (ref 0.2–1.2)
Total Bilirubin: 0.8 mg/dL (ref 0.2–1.2)
Total Protein: 7.4 g/dL (ref 6.1–8.1)

## 2015-03-29 LAB — LIPID PANEL
CHOL/HDL RATIO: 2.8 ratio (ref <=5.0)
Cholesterol: 201 mg/dL — ABNORMAL HIGH (ref 125–200)
HDL: 73 mg/dL (ref >=40)
LDL CALC: 114 mg/dL (ref <130)
Triglycerides: 71 mg/dL (ref <150)
VLDL: 14 mg/dL (ref <30)

## 2015-03-29 LAB — POCT GLYCOSYLATED HEMOGLOBIN (HGB A1C): HEMOGLOBIN A1C: 5.4

## 2015-03-29 MED ORDER — NEBIVOLOL HCL 10 MG PO TABS: 10.0000 mg | ORAL_TABLET | Freq: Every day | ORAL | Status: DC

## 2015-03-29 MED ORDER — AMLODIPINE BESYLATE 10 MG PO TABS
10.0000 mg | ORAL_TABLET | Freq: Every day | ORAL | Status: DC
Start: 2015-03-29 — End: 2015-08-21

## 2015-03-29 MED ORDER — CLONIDINE HCL 0.1 MG PO TABS
0.1000 mg | ORAL_TABLET | Freq: Once | ORAL | Status: AC
  Administered 2015-03-29: 0.1 mg via ORAL

## 2015-03-29 NOTE — Progress Notes (Signed)
Patient here to re-establish care Patient was seen last week in urgent care for elevated blood pressure Patient presents in office today again with elevated blood pressure but stated he Has not yet taken his medication today

## 2015-03-29 NOTE — Telephone Encounter (Signed)
Patient presents to the clinic to speak with Timothy [name]. Pt was told to speak with [name] once he received his [facility] discount in order to be referred accordingly. Patient does not remember what type of referral he needed. Please follow up with pt.

## 2015-03-29 NOTE — Patient Instructions (Addendum)
Please do not take Carvedilol. I have placed you on amlodipine and Bystolic only. I will see you in 2 weeks for a BP recheck with the nurse.  Call for referral once you get hospital discount   DASH Eating Plan [place] stands for "Dietary Approaches to Stop Hypertension." The DASH eating plan is a healthy eating plan that has been shown to reduce high blood pressure (hypertension). Additional health benefits may include reducing the risk of type 2 diabetes mellitus, heart disease, and stroke. The DASH eating plan may also help with weight loss. WHAT DO I NEED TO KNOW ABOUT THE DASH EATING PLAN? For the DASH eating plan, you will follow these general guidelines:  Choose foods with a percent daily value for sodium of less than 5% (as listed on the food label).  Use salt-free seasonings or herbs instead of table salt or sea salt.  Check with your health care provider or pharmacist before using salt substitutes.  Eat lower-sodium products, often labeled as "lower sodium" or "no salt added."  Eat fresh foods.  Eat more vegetables, fruits, and low-fat dairy products.  Choose whole grains. Look for the word "whole" as the first word in the ingredient list.  Choose fish and skinless chicken or [place] more often than red meat. Limit fish, poultry, and meat to 6 oz (170 g) each day.  Limit sweets, desserts, sugars, and sugary drinks.  Choose heart-healthy fats.  Limit cheese to 1 oz (28 g) per day.  Eat more home-cooked food and less restaurant, buffet, and fast food.  Limit fried foods.  Cook foods using methods other than frying.  Limit canned vegetables. If you do use them, rinse them well to decrease the sodium.  When eating at a restaurant, ask that your food be prepared with less salt, or no salt if possible. WHAT FOODS CAN I EAT? Seek help from a dietitian for individual calorie needs. Grains Whole grain or whole wheat bread. Brown rice. Whole grain or whole wheat pasta. Quinoa,  bulgur, and whole grain cereals. Low-sodium cereals. Corn or whole wheat flour tortillas. Whole grain cornbread. Whole grain crackers. Low-sodium crackers. Vegetables Fresh or frozen vegetables (raw, steamed, roasted, or grilled). Low-sodium or reduced-sodium tomato and vegetable juices. Low-sodium or reduced-sodium tomato sauce and paste. Low-sodium or reduced-sodium canned vegetables.  Fruits All fresh, canned (in natural juice), or frozen fruits. Meat and Other Protein Products Ground beef (85% or leaner), grass-fed beef, or beef trimmed of fat. Skinless chicken or [place]. Ground chicken or [place]. Pork trimmed of fat. All fish and seafood. Eggs. Dried beans, peas, or lentils. Unsalted nuts and seeds. Unsalted canned beans. Dairy Low-fat dairy products, such as skim or 1% milk, 2% or reduced-fat cheeses, low-fat ricotta or cottage cheese, or plain low-fat yogurt. Low-sodium or reduced-sodium cheeses. Fats and Oils Tub margarines without trans fats. Light or reduced-fat mayonnaise and salad dressings (reduced sodium). Avocado. Safflower, olive, or canola oils. Natural peanut or almond butter. Other Unsalted popcorn and pretzels. The items listed above may not be a complete list of recommended foods or beverages. Contact your dietitian for more options. WHAT FOODS ARE NOT RECOMMENDED? Grains White bread. White pasta. White rice. Refined cornbread. Bagels and croissants. Crackers that contain trans fat. Vegetables Creamed or fried vegetables. Vegetables in a cheese sauce. Regular canned vegetables. Regular canned tomato sauce and paste. Regular tomato and vegetable juices. Fruits Dried fruits. Canned fruit in light or heavy syrup. Fruit juice. Meat and Other Protein Products Fatty cuts of meat.  Ribs, chicken wings, bacon, sausage, bologna, salami, chitterlings, fatback, hot dogs, bratwurst, and packaged luncheon meats. Salted nuts and seeds. Canned beans with salt. Dairy Whole or 2% milk,  cream, half-and-half, and cream cheese. Whole-fat or sweetened yogurt. Full-fat cheeses or blue cheese. Nondairy creamers and whipped toppings. Processed cheese, cheese spreads, or cheese curds. Condiments Onion and garlic salt, seasoned salt, table salt, and sea salt. Canned and packaged gravies. Worcestershire sauce. Tartar sauce. Barbecue sauce. Teriyaki sauce. Soy sauce, including reduced sodium. Steak sauce. Fish sauce. Oyster sauce. Cocktail sauce. Horseradish. Ketchup and mustard. Meat flavorings and tenderizers. Bouillon cubes. Hot sauce. Tabasco sauce. Marinades. Taco seasonings. Relishes. Fats and Oils Butter, stick margarine, lard, shortening, ghee, and bacon fat. Coconut, palm kernel, or palm oils. Regular salad dressings. Other Pickles and olives. Salted popcorn and pretzels. The items listed above may not be a complete list of foods and beverages to avoid. Contact your dietitian for more information. WHERE CAN I FIND MORE INFORMATION? National Heart, Lung, and Blood Institute: travelstabloid.com Document Released: 06/11/2011 Document Revised: 11/06/2013 Document Reviewed: 04/26/2013 [facility] Patient Information 2015 [place], [place]. This information is not intended to replace advice given to you by your health care provider. Make sure you discuss any questions you have with your health care provider.

## 2015-03-29 NOTE — Progress Notes (Signed)
Patient ID: Timothy [name], male   DOB: Sep 02, 1960, 54 y.o.   MRN: [id number]  [id number]  [id number]  DOB - 1960-09-09  CC:  Chief Complaint  Patient presents with  . New Patient (Initial Visit)       HPI: Timothy [name] is a 54 y.o. male here today to establish medical care. Patient has a past medical history of HTN and autoimmune hepatitis. Patient has not been evaluated here for hypertension since 2014. Patient reports that he went to the urgent care for symptoms of headache last week and was diagnosed with elevated BP. Patient was given coreg and bystolic at that time. He has since taking both of those medications. He reports that he did not take the medications this morning due to rushing. He has not been on Imuran for his hepatitis in several months and does not have a current specialist for his autoimmune hepatitis. Patient reports that he has had mild headaches and blurred vision. He denie symptoms of chest pain, palpitations, edema.    No Known Allergies Past Medical History  Diagnosis Date  . Hypertension    Current Outpatient Prescriptions on File Prior to Visit  Medication Sig Dispense Refill  . azaTHIOprine (IMURAN) 50 MG tablet Take 1 tablet (50 mg total) by mouth daily. 30 tablet 5  . carvedilol (COREG) 6.25 MG tablet Take 1 tablet (6.25 mg total) by mouth 2 (two) times daily with a meal. 60 tablet 0  . nebivolol (BYSTOLIC) 10 MG tablet Take 1 tablet (10 mg total) by mouth daily. 30 tablet 0   No current facility-administered medications on file prior to visit.   History reviewed. No pertinent family history. Social History   Social History  . Marital Status: Single    Spouse Name: N/A  . Number of Children: N/A  . Years of Education: N/A   Occupational History  . Not on file.   Social History Main Topics  . Smoking status: Never Smoker   . Smokeless tobacco: Not on file  . Alcohol Use: No  . Drug Use: No  . Sexual Activity: Not Currently   Other Topics  Concern  . Not on file   Social History Narrative    Review of Systems: Other than what is stated in HPI, all other systems are negative.   Objective:   Filed Vitals:   03/29/15 1023  BP: 163/112  Pulse: 57  Temp:   Resp:     Physical Exam  Cardiovascular: Normal rate, regular rhythm and normal heart sounds.   Pulmonary/Chest: Effort normal and breath sounds normal.  Abdominal: Soft. Bowel sounds are normal.  Musculoskeletal: He exhibits no edema.  Neurological: He is alert.  Skin: Skin is warm and dry.  Psychiatric: He has a normal mood and affect.     Lab Results  Component Value Date   WBC 3.1* 03/14/2013   HGB 16.7 03/21/2015   HCT 49.0 03/21/2015   MCV 86.5 03/14/2013   PLT 80* 03/14/2013   Lab Results  Component Value Date   CREATININE 1.00 03/21/2015   BUN 14 03/21/2015   NA 141 03/21/2015   K 4.5 03/21/2015   CL 103 03/21/2015   CO2 22 03/14/2013    Lab Results  Component Value Date   HGBA1C 5.6 11/15/2012   Lipid Panel     Component Value Date/Time   CHOL 187 11/15/2012 1142   TRIG 73 11/15/2012 1142   HDL 63 11/15/2012 1142   CHOLHDL 3.0 11/15/2012 1142   VLDL  15 11/15/2012 1142   LDLCALC 109* 11/15/2012 1142       Assessment and plan:   Timothy [name] was seen today for new patient (initial visit).  Diagnoses and all orders for this visit:  Essential hypertension -     Continue nebivolol (BYSTOLIC) 10 MG tablet; Take 1 tablet (10 mg total) by mouth daily. -     Begin amLODipine (NORVASC) 10 MG tablet; Take 1 tablet (10 mg total) by mouth daily. -     Lipid panel Patient is not controlled on bystolic and coreg. I will discontinue coreg because both medications are a beta blocker. Patient will continue bystolic and begin amlodipine. I will recheck pressure in 2 weeks. DASH diet advised. Medication compliance addressed to prevent kidney damage.   Elevated blood pressure -     cloNIDine (CATAPRES) tablet 0.1 mg; Take 1 tablet (0.1 mg total) by  mouth once in office.  Elevated blood sugar -     HgB A1c--5.4%  Autoimmune hepatitis -     CBC with Differential -     Hepatic Function Panel Once patient receives hospital discount I will send referral to GI for maintenance of his autoimmune hepatitis.    Return in about 2 weeks (around 04/12/2015) for Nurse Visit-BP check and 3 mo PCP HTN .     Lance Bosch, [place] [facility] [phone] 03/29/2015, 10:32 AM

## 2015-03-30 LAB — CBC WITH DIFFERENTIAL/PLATELET
BASOS ABS: 0 10*3/uL (ref 0.0–0.1)
BASOS PCT: 1 % (ref 0–1)
EOS ABS: 0.6 10*3/uL (ref 0.0–0.7)
EOS PCT: 13 % — AB (ref 0–5)
HCT: 45.2 % (ref 39.0–52.0)
Hemoglobin: 15.1 g/dL (ref 13.0–17.0)
LYMPHS PCT: 41 % (ref 12–46)
Lymphs Abs: 1.9 10*3/uL (ref 0.7–4.0)
MCH: 29 pg (ref 26.0–34.0)
MCHC: 33.4 g/dL (ref 30.0–36.0)
MCV: 86.8 fL (ref 78.0–100.0)
MONO ABS: 0.6 10*3/uL (ref 0.1–1.0)
Monocytes Relative: 12 % (ref 3–12)
Neutro Abs: 1.6 10*3/uL — ABNORMAL LOW (ref 1.7–7.7)
Neutrophils Relative %: 33 % — ABNORMAL LOW (ref 43–77)
PLATELETS: 62 10*3/uL — AB (ref 150–400)
RBC: 5.21 MIL/uL (ref 4.22–5.81)
RDW: 15.6 % — AB (ref 11.5–15.5)
WBC: 4.7 10*3/uL (ref 4.0–10.5)

## 2015-04-04 ENCOUNTER — Other Ambulatory Visit: Payer: Self-pay | Admitting: Internal Medicine

## 2015-04-04 DIAGNOSIS — D696 Thrombocytopenia, unspecified: Secondary | ICD-10-CM

## 2015-04-04 DIAGNOSIS — K754 Autoimmune hepatitis: Secondary | ICD-10-CM

## 2015-04-05 NOTE — Telephone Encounter (Signed)
Nikki from [facility] called stating that patient does not know what kind of referral he needed. She would like to know if she could schedule the patient for the hematology. Please f/u  Lexine [phone]

## 2015-04-11 ENCOUNTER — Ambulatory Visit: Payer: Self-pay | Attending: Internal Medicine | Admitting: Pharmacist

## 2015-04-11 ENCOUNTER — Telehealth: Payer: Self-pay

## 2015-04-11 VITALS — BP 109/72 | HR 57

## 2015-04-11 DIAGNOSIS — I10 Essential (primary) hypertension: Secondary | ICD-10-CM

## 2015-04-11 DIAGNOSIS — Z79899 Other long term (current) drug therapy: Secondary | ICD-10-CM | POA: Insufficient documentation

## 2015-04-11 NOTE — Progress Notes (Signed)
S:    Patient arrives in good spirits.  Presents to the clinic for hypertension evaluation.   Patient reports adherence with medications.  Current BP Medications include:  nebivolol 10 mg daily and amlodipine 10 mg daily.   He monitors his blood pressure at home and has been seeing readings 308M-578I/69G-29B, with one systolic reading of 284. Pulses 60-70s.  He reports that his headaches and dizziness have resolved and that he feels much better.    O:   Last 3 Office BP readings: BP Readings from Last 3 Encounters:  04/11/15 109/72  03/29/15 160/118  03/21/15 203/126    BMET    Component Value Date/Time   NA 141 03/21/2015 1823   K 4.5 03/21/2015 1823   CL 103 03/21/2015 1823   CO2 22 03/14/2013 0927   GLUCOSE 185* 03/21/2015 1823   BUN 14 03/21/2015 1823   CREATININE 1.00 03/21/2015 1823   CREATININE 0.97 03/14/2013 0927   CALCIUM 9.3 03/14/2013 0927   GFRNONAA 96 01/11/2008 1446   GFRAA 116 01/11/2008 1446    A/P: History of hypertension currently controlled on current medications and is tolerating medications well. No changes to medications at this time. Medication reconciliation completed. Patient to follow up with primary care provider as directed.   Results reviewed and written information provided. Total time in face-to-face counseling 20 minutes.   F/U Clinic Visit with Chari Manning, NP.

## 2015-04-11 NOTE — Patient Instructions (Signed)
Thank you for coming to see me today!  Keep taking all of your medications as directed - your blood pressure looks great!!

## 2015-04-11 NOTE — Telephone Encounter (Signed)
-----   Message from Nila Nephew, RN sent at 04/08/2015  2:02 PM EDT -----   ----- Message -----    From: Lance Bosch, NP    Sent: 04/04/2015   6:34 PM      To: Nila Nephew, RN  His blood test were abnormal, platelet count very low. I am worried he may have something else going on other than the autoimmune hepatitis. I would like him to see a Hematologist as soon as possible. I will place referral but he will likely need the hospital discount soon. If he has not heard back from Hematology in one week, call us so that we can make sure referral went through

## 2015-04-11 NOTE — Telephone Encounter (Signed)
Attempted to call patient  Patient not available Left message on voice mail to return our call 

## 2015-04-17 ENCOUNTER — Ambulatory Visit: Payer: Self-pay

## 2015-05-29 ENCOUNTER — Other Ambulatory Visit: Payer: Self-pay | Admitting: Internal Medicine

## 2015-05-29 DIAGNOSIS — I10 Essential (primary) hypertension: Secondary | ICD-10-CM

## 2015-05-29 MED ORDER — NEBIVOLOL HCL 10 MG PO TABS: 10.0000 mg | ORAL_TABLET | Freq: Every day | ORAL | Status: DC

## 2015-06-24 ENCOUNTER — Telehealth: Payer: Self-pay | Admitting: *Deleted

## 2015-06-24 NOTE — Telephone Encounter (Signed)
Medical Assistant spoke with Lexine Baton from Oncology. Lexine Baton stated she was unable to reach the patient to schedule an appointment. Medical Assistant left message on patient's home and cell voicemail. Voicemail states to give a call back to [place] with [facility] at [phone].   Please inform patient to contact [facility] to schedule his appointment with them for his referral.

## 2015-07-24 MED FILL — AMLODIPINE BESYLATE 10 MG T: 10 | 30 days supply | Qty: 30 | Fill #3

## 2015-08-21 ENCOUNTER — Other Ambulatory Visit: Payer: Self-pay | Admitting: Internal Medicine

## 2015-08-21 MED FILL — AMLODIPINE BESYLATE 10 MG T: 10 | 30 days supply | Qty: 30 | Fill #0

## 2015-08-29 ENCOUNTER — Other Ambulatory Visit: Payer: Self-pay | Admitting: Internal Medicine

## 2015-09-05 ENCOUNTER — Other Ambulatory Visit: Payer: Self-pay | Admitting: Internal Medicine

## 2015-09-27 ENCOUNTER — Encounter: Payer: Self-pay | Admitting: Internal Medicine

## 2015-09-27 ENCOUNTER — Ambulatory Visit: Payer: Self-pay | Attending: Internal Medicine | Admitting: Internal Medicine

## 2015-09-27 VITALS — BP 161/92 | HR 60 | Temp 97.7°F | Resp 16 | Ht 77.0 in | Wt 228.6 lb

## 2015-09-27 DIAGNOSIS — I10 Essential (primary) hypertension: Secondary | ICD-10-CM | POA: Insufficient documentation

## 2015-09-27 DIAGNOSIS — D696 Thrombocytopenia, unspecified: Secondary | ICD-10-CM | POA: Insufficient documentation

## 2015-09-27 DIAGNOSIS — Z79899 Other long term (current) drug therapy: Secondary | ICD-10-CM | POA: Insufficient documentation

## 2015-09-27 LAB — CBC WITH DIFFERENTIAL/PLATELET
BASOS ABS: 0 10*3/uL (ref 0.0–0.1)
BASOS PCT: 1 % (ref 0–1)
EOS ABS: 0.5 10*3/uL (ref 0.0–0.7)
Eosinophils Relative: 10 % — ABNORMAL HIGH (ref 0–5)
HCT: 43.1 % (ref 39.0–52.0)
Hemoglobin: 14.6 g/dL (ref 13.0–17.0)
Lymphocytes Relative: 37 % (ref 12–46)
Lymphs Abs: 1.8 10*3/uL (ref 0.7–4.0)
MCH: 29.4 pg (ref 26.0–34.0)
MCHC: 33.9 g/dL (ref 30.0–36.0)
MCV: 86.9 fL (ref 78.0–100.0)
MONOS PCT: 9 % (ref 3–12)
Monocytes Absolute: 0.4 10*3/uL (ref 0.1–1.0)
NEUTROS PCT: 43 % (ref 43–77)
Neutro Abs: 2.1 10*3/uL (ref 1.7–7.7)
PLATELETS: 70 10*3/uL — AB (ref 150–400)
RBC: 4.96 MIL/uL (ref 4.22–5.81)
RDW: 15.1 % (ref 11.5–15.5)
WBC: 4.8 10*3/uL (ref 4.0–10.5)

## 2015-09-27 MED ORDER — AMLODIPINE BESYLATE 10 MG PO TABS: 10.0000 mg | ORAL_TABLET | Freq: Every day | ORAL | Status: DC

## 2015-09-27 MED ORDER — NEBIVOLOL HCL 10 MG PO TABS: 10.0000 mg | ORAL_TABLET | Freq: Every day | ORAL | Status: DC

## 2015-09-27 MED FILL — $BYSTOLIC 10 MG TABLET: 10 | 30 days supply | Qty: 30 | Fill #0

## 2015-09-27 NOTE — Patient Instructions (Signed)
On the last blood test I found abnormally low platelets which can be a serious problem. Please apply for [facility] Discount so that I can send you to a blood specialist (Hematologist) for review.

## 2015-09-27 NOTE — Progress Notes (Signed)
Patient's here for f/up HTN. Patient reports not taking meds today.   Patient reports feeling food today.  Patient requesting med refills.

## 2015-09-27 NOTE — Progress Notes (Signed)
Patient ID: Timothy [name], male   DOB: 1960-12-11, 55 y.o.   MRN: [id number] Subjective:  Timothy [name] is a 55 y.o. male with hypertension. Patient states that he has not taken his blood pressure medication today because he has bene busy. He has not complications today.  Patient states that he has had treatment for his hepatitis at [facility] several years ago and thinks he has been cleared.  Current Outpatient Prescriptions  Medication Sig Dispense Refill  . amLODipine (NORVASC) 10 MG tablet Take 1 tablet (10 mg total) by mouth daily. Needs office visit for refills 30 tablet 0  . nebivolol (BYSTOLIC) 10 MG tablet Take 1 tablet (10 mg total) by mouth daily. 90 tablet 3   No current facility-administered medications for this visit.    Hypertension ROS: taking medications as instructed, no medication side effects noted, no TIA's, no chest pain on exertion, no dyspnea on exertion, no swelling of ankles and no palpitations.   Objective:  Ht 6\' 5"  (1.956 m)  Appearance alert, well appearing, and in no distress, oriented to person, place, and time and normal appearing weight. General exam BP noted to be well controlled today in office, S1, S2 normal, no gallop, no murmur, chest clear, no JVD, no HSM, no edema.  Lab review: orders written for new lab studies as appropriate; see orders.   Assessment:   Aires was seen today for follow-up and hypertension.  Diagnoses and all orders for this visit:  Essential hypertension -     amLODipine (NORVASC) 10 MG tablet; Take 1 tablet (10 mg total) by mouth daily. Needs office visit for refills -     nebivolol (BYSTOLIC) 10 MG tablet; Take 1 tablet (10 mg total) by mouth daily. BP is elevated today but likely due to not taking medication today. He will take medication when he leaves her today. DASH diet advised.   Thrombocytopenia ([place]) -     CBC with Differential I will recheck labs today. I have discussed importance of getting [facility] discount so that I can refer him to  Hematology. He will call for referral once he is approved.    Return in about 3 months (around 12/28/2015) for Hypertension.  Timothy Bosch, NP 09/27/2015 2:26 PM

## 2015-09-30 ENCOUNTER — Other Ambulatory Visit: Payer: Self-pay | Admitting: Internal Medicine

## 2015-09-30 DIAGNOSIS — D696 Thrombocytopenia, unspecified: Secondary | ICD-10-CM

## 2015-10-01 ENCOUNTER — Telehealth: Payer: Self-pay

## 2015-10-01 NOTE — Telephone Encounter (Signed)
-----   Message from Lance Bosch, NP sent at 09/30/2015  8:35 PM EDT ----- Has patient applied for [facility] discount yet? I will place referral for Hematology. Platelets are still very low. I will see if Hematology can get him in soon. Thanks

## 2015-10-01 NOTE — Telephone Encounter (Signed)
Spoke with patient in reference to applying for [facility] discount  Patient has an appointment this Friday with diane for financial application

## 2015-10-04 ENCOUNTER — Ambulatory Visit: Payer: Self-pay | Attending: Internal Medicine

## 2015-10-07 ENCOUNTER — Encounter: Payer: Self-pay | Admitting: Hematology

## 2015-10-07 ENCOUNTER — Encounter: Payer: Self-pay | Admitting: Internal Medicine

## 2015-10-07 ENCOUNTER — Telehealth: Payer: Self-pay | Admitting: Hematology

## 2015-10-07 NOTE — Telephone Encounter (Signed)
Mailed np packet New pt letter faxed to the referring office Intake Referring [facility] Dx-Thrombocytopenia

## 2015-10-24 ENCOUNTER — Telehealth: Payer: Self-pay | Admitting: Hematology

## 2015-10-24 ENCOUNTER — Other Ambulatory Visit (HOSPITAL_BASED_OUTPATIENT_CLINIC_OR_DEPARTMENT_OTHER): Payer: Self-pay

## 2015-10-24 ENCOUNTER — Encounter: Payer: Self-pay | Admitting: Hematology

## 2015-10-24 ENCOUNTER — Ambulatory Visit (HOSPITAL_BASED_OUTPATIENT_CLINIC_OR_DEPARTMENT_OTHER): Payer: Self-pay | Admitting: Hematology

## 2015-10-24 VITALS — BP 139/80 | HR 53 | Temp 98.2°F | Resp 18 | Ht 77.0 in | Wt 226.9 lb

## 2015-10-24 DIAGNOSIS — K7460 Unspecified cirrhosis of liver: Secondary | ICD-10-CM

## 2015-10-24 DIAGNOSIS — D696 Thrombocytopenia, unspecified: Secondary | ICD-10-CM

## 2015-10-24 DIAGNOSIS — B1920 Unspecified viral hepatitis C without hepatic coma: Secondary | ICD-10-CM

## 2015-10-24 LAB — CHCC SMEAR

## 2015-10-24 LAB — COMPREHENSIVE METABOLIC PANEL
ALBUMIN: 3 g/dL — AB (ref 3.5–5.0)
ALK PHOS: 97 U/L (ref 40–150)
ALT: 35 U/L (ref 0–55)
AST: 56 U/L — AB (ref 5–34)
Anion Gap: 8 mEq/L (ref 3–11)
BILIRUBIN TOTAL: 0.65 mg/dL (ref 0.20–1.20)
BUN: 19 mg/dL (ref 7.0–26.0)
CO2: 25 mEq/L (ref 22–29)
CREATININE: 1.4 mg/dL — AB (ref 0.7–1.3)
Calcium: 9 mg/dL (ref 8.4–10.4)
Chloride: 111 mEq/L — ABNORMAL HIGH (ref 98–109)
EGFR: 65 mL/min/{1.73_m2} — ABNORMAL LOW (ref >90)
GLUCOSE: 108 mg/dL (ref 70–140)
Potassium: 3.7 mEq/L (ref 3.5–5.1)
SODIUM: 144 meq/L (ref 136–145)
TOTAL PROTEIN: 7.2 g/dL (ref 6.4–8.3)

## 2015-10-24 LAB — CBC & DIFF AND RETIC
BASO%: 0.8 % (ref 0.0–2.0)
BASOS ABS: 0 10*3/uL (ref 0.0–0.1)
EOS ABS: 0.4 10*3/uL (ref 0.0–0.5)
EOS%: 9.1 % — ABNORMAL HIGH (ref 0.0–7.0)
HEMATOCRIT: 38.9 % (ref 38.4–49.9)
HEMOGLOBIN: 13.2 g/dL (ref 13.0–17.1)
IMMATURE RETIC FRACT: 1.2 % — AB (ref 3.00–10.60)
LYMPH%: 46.2 % (ref 14.0–49.0)
MCH: 28.9 pg (ref 27.2–33.4)
MCHC: 33.9 g/dL (ref 32.0–36.0)
MCV: 85.1 fL (ref 79.3–98.0)
MONO#: 0.5 10*3/uL (ref 0.1–0.9)
MONO%: 11.2 % (ref 0.0–14.0)
NEUT#: 1.6 10*3/uL (ref 1.5–6.5)
NEUT%: 32.7 % — ABNORMAL LOW (ref 39.0–75.0)
Platelets: 62 10*3/uL — ABNORMAL LOW (ref 140–400)
RBC: 4.57 10*6/uL (ref 4.20–5.82)
RDW: 15.8 % — AB (ref 11.0–14.6)
RETIC %: 0.87 % (ref 0.80–1.80)
Retic Ct Abs: 39.76 10*3/uL (ref 34.80–93.90)
WBC: 4.8 10*3/uL (ref 4.0–10.3)
lymph#: 2.2 10*3/uL (ref 0.9–3.3)

## 2015-10-24 NOTE — Telephone Encounter (Signed)
Gave pt appt for July & avs °

## 2015-10-24 NOTE — Progress Notes (Signed)
Marland Kitchen    HEMATOLOGY/ONCOLOGY CONSULTATION NOTE  Date of Service: 10/24/2015  Patient Care Team: Lance Bosch, NP as PCP - General (Internal Medicine)  CHIEF COMPLAINTS/PURPOSE OF CONSULTATION:  Thrombocytopenia  HISTORY OF PRESENTING ILLNESS:  Timothy [name] is a wonderful 55 y.o. male who has been referred to [place] by Dr Feliciana Rossetti for evaluation and management of thrombocytopenia.  Patient has a history of hypertension, autoimmune hepatitis, liver cirrhosis. He notes that he was treated at [facility] for his ? Autoimmune hepatitis in [id number] but is uncertain about the exact treatment (appears he was treated with Azathioprine and was on diuretics). He notes that he hasn't had any recent follow-up her specialist or had any recent abdominal imaging since 2009.  He was referred to [place] for evaluation of thrombocytopenia after his recent labs with Dr. Feliciana Rossetti on 09/27/2015 showed a platelet count of 70k. In reviewing his previous labs in our system it appears that he has had thrombocytopenia in the range of 70-80k platelets since June 2009. He reports no bleeding. No epistaxis. No blood in the stools. No hematuria. No hemoptysis. Reports no new medications. No recent viral respiratory or gastrointestinal infections.  No other acute new symptoms. Notes that he is feeling well.   MEDICAL HISTORY:  Past Medical History  Diagnosis Date  . Hypertension   Liver cirrhosis  ?Autoimmune hepatitis treated with azathioprine in 2009. Was following with Dr. Shearon Stalls at [facility] gastroenterology.  SURGICAL HISTORY: History reviewed. No pertinent past surgical history.  SOCIAL HISTORY: Social History   Social History  . Marital Status: Single    Spouse Name: N/A  . Number of Children: N/A  . Years of Education: N/A   Occupational History  . Not on file.   Social History Main Topics  . Smoking status: Never Smoker   . Smokeless tobacco: Not on file  . Alcohol Use: No  . Drug Use: No  . Sexual Activity: Not Currently    Other Topics Concern  . Not on file   Social History Narrative   Ex smoker quit 10 yrs ago, previously 1 PPD x 30-35 yrs. Cocaine ex 19 yrs Construction type work painting/remodelling   FAMILY HISTORY: History reviewed. No pertinent family history.  Denies any family history of blood disorders or cancers.  ALLERGIES:  has No Known Allergies.  MEDICATIONS:  Current Outpatient Prescriptions  Medication Sig Dispense Refill  . amLODipine (NORVASC) 10 MG tablet Take 1 tablet (10 mg total) by mouth daily. Needs office visit for refills 90 tablet 1  . nebivolol (BYSTOLIC) 10 MG tablet Take 1 tablet (10 mg total) by mouth daily. 90 tablet 1   No current facility-administered medications for this visit.    REVIEW OF SYSTEMS:    10 Point review of Systems was done is negative except as noted above.  PHYSICAL EXAMINATION: ECOG PERFORMANCE STATUS: 1 - Symptomatic but completely ambulatory  . Filed Vitals:   10/24/15 1402  BP: 139/80  Pulse: 53  Temp: 98.2 F (36.8 C)  Resp: 18   Filed Weights   10/24/15 1402  Weight: 226 lb 14.4 oz (102.921 kg)   .Body mass index is 26.9 kg/(m^2).  GENERAL:alert, in no acute distress and comfortable SKIN: skin color, texture, turgor are normal, no rashes or significant lesions EYES: normal, conjunctiva are pink and non-injected, sclera clear OROPHARYNX:no exudate, no erythema and lips, buccal mucosa, and tongue normal  NECK: supple, no JVD, thyroid normal size, non-tender, without nodularity LYMPH:  no palpable lymphadenopathy in the cervical,  axillary or inguinal LUNGS: clear to auscultation with normal respiratory effort HEART: regular rate & rhythm,  no murmurs and no lower extremity edema ABDOMEN: abdomen soft, non-tender, normoactive bowel sounds . No overtly palpable hepatomegaly, borderline palpable splenomegaly. Musculoskeletal: no cyanosis of digits and no clubbing  PSYCH: alert & oriented x 3 with fluent speech NEURO: no  focal motor/sensory deficits  LABORATORY DATA:  I have reviewed the data as listed  . CBC Latest Ref Rng 10/24/2015 09/27/2015 03/29/2015  WBC 4.0 - 10.3 10e3/uL 4.8 4.8 4.7  Hemoglobin 13.0 - 17.1 g/dL 13.2 14.6 15.1  Hematocrit 38.4 - 49.9 % 38.9 43.1 45.2  Platelets 140 - 400 10e3/uL 62 Large platelets present(L) 70(L) 62(L)    . CMP Latest Ref Rng 10/24/2015 03/29/2015 03/21/2015  Glucose 70 - 140 mg/dl 108 - 185(H)  BUN 7.0 - 26.0 mg/dL 19.0 - 14  Creatinine 0.7 - 1.3 mg/dL 1.4(H) - 1.00  Sodium 136 - 145 mEq/L 144 - 141  Potassium 3.5 - 5.1 mEq/L 3.7 - 4.5  Chloride 101 - 111 mmol/L - - 103  CO2 22 - 29 mEq/L 25 - -  Calcium 8.4 - 10.4 mg/dL 9.0 - -  Total Protein 6.4 - 8.3 g/dL 7.2 7.4 -  Total Bilirubin 0.20 - 1.20 mg/dL 0.65 0.8 -  Alkaline Phos 40 - 150 U/L 97 89 -  AST 5 - 34 U/L 56(H) 51(H) -  ALT 0 - 55 U/L 35 36 -      All  Component     Latest Ref Rng 10/24/2015  HCV Quant Baseline      HCV Not Detected  IU LOG10      Test Not Performed.  Test Information      Comment  Hcv Genotype      Test Not Performed.  Hep C Virus Ab     0.0 - 0.9 s/co ratio 0.2    Diagnosis  A. "LIVER BIOPSY" (TRANSJUGULAR BIOPSY): 02/27/2008       FEATURES OF MILD INTERFACE AND LOBULAR HEPATITIS. SEE COMMENT AND       MICROSCOPIC DESCRIPTION.     EVIDENCE OF MILD BILE DUCT INJURY.     MILD TO MODERATE CANALICULAR AND HEPATOCELLULAR CHOLESTASIS.     CIRRHOSIS.  COMMENT: From a morphologic perspective the underlying reason the patient's cirrhosis is not definitively evident. There appears to be a biliary pattern of injury with cholestasis as well as interface and lobular hepatitis. The differential diagnosis includes, but is not limited to, autoimmune hepatitis with an overlap autoimmune cholangiopathy, the effects of medications/drugs/herbal remedies, infectious etiologies, primary and secondary biliary processes such as primary sclerosing cholangitis and secondary  sclerosing cholangitis. Clinical and laboratory correlation are required. Comment:  I certify that I personally conducted the diagnostic evaluation of the above  specimen(s) and have rendered the above diagnosis(es).                             Docia Chuck, M.D.  RADIOGRAPHIC STUDIES: I have personally reviewed the radiological images as listed and agreed with the findings in the report. No results found.  ASSESSMENT & PLAN:   55 year old African-American gentleman with  #1 chronic thrombocytopenia. Platelet counts for about 70-80K since June 2009. Recently his counts have slightly dropped to the 60-70K range. Patient notes no issues with easy bruisability or overt bleeding. Has a history of  liver cirrhosis. 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 Cannot rule out ITP  #2 history of ? autoimmune hepatitis- was being treated by Dr. Shearon Stalls at [facility] gastroenterology. Was previously on as of the upper and in 2009. Appears to have been lost to follow-up. Labs today show he is hepatitis C negative Alpha-fetoprotein was previously elevated to 20.5 on 02/24/2008. Previous tablet 2009 showed he was hepatitis B negative, hepatitis C negative Anti-smooth muscle antibody screen negative Antimitochondrial antibody negative Patient's liver function tests are much better than in 2009 with only minor elevation in AST levels.  #3 history of liver cirrhosis - patient notes that he has not had a liver follow-up for his liver cirrhosis. He denies that he has been getting any hepatocellular carcinoma screening/follow-up since 2009.  Plan -Peripheral blood smear to rule out pseudothrombocytopenia. -Ultrasound of the abdomen to evaluate the liver and splenic size. -Avoid NSAIDs or other medications that might affect platelet function or numbers. -Continue every 6 monthly ultrasound abdomen and AFP tumor marker for  hepatocellular carcinoma screening with primary care physician. -No indication for treatment of the patient's thrombocytopenia at this time. -We will likely need continued follow-up with hepatology.  We will follow up on the testing results and call the patient with the results once available.  Return to care with Dr. Irene Limbo in 3 months with CBC, CMP to monitor platelet counts. Earlier if any other concerning results obtained on initial testing.  Orders Placed This Encounter  Procedures  . US Abdomen Complete    Standing Status: Future     Number of Occurrences:      Standing Expiration Date: 10/23/2016    Order Specific Question:  Reason for Exam (SYMPTOM  OR DIAGNOSIS REQUIRED)    Answer:  liver cirrhosis - evaluate for splenomegaly    Order Specific Question:  Preferred imaging location?    Answer:  [facility]  . CBC & Diff and Retic    Standing Status: Future     Number of Occurrences: 1     Standing Expiration Date: 11/27/2016  . Comprehensive metabolic panel    Standing Status: Future     Number of Occurrences: 1     Standing Expiration Date: 10/23/2016  . Smear    Standing Status: Future     Number of Occurrences: 1     Standing Expiration Date: 10/23/2016  . Hepatitis C antibody    Standing Status: Future     Number of Occurrences: 1     Standing Expiration Date: 10/23/2016  . Hepatitis C genotype    Standing Status: Future     Number of Occurrences: 1     Standing Expiration Date: 10/23/2016  . HCV RNA quant rflx ultra or genotyp    Standing Status: Future     Number of Occurrences: 1     Standing Expiration Date: 10/23/2016  . AFP tumor marker    Standing Status: Future     Number of Occurrences: 1     Standing Expiration Date: 10/23/2016     All of the patients questions were answered with apparent satisfaction. The patient knows to call the clinic with any problems, questions or concerns.  I spent 60 minutes counseling the patient face to face. The total  time spent in the appointment was 60 minutes and more than 50% was on counseling and direct patient cares.    Sullivan Lone MD [place] AAHIVMS [facility] Hematology/Oncology Physician [place]  (Office):  [phone] (Work cell):  [phone] (Fax):           [phone]  10/24/2015 2:33 PM

## 2015-10-25 LAB — HCV RNA QUANT RFLX ULTRA OR GENOTYP: HCV Quant Baseline: NOT DETECTED IU/mL

## 2015-10-25 LAB — AFP TUMOR MARKER: AFP, SERUM, TUMOR MARKER: 5.3 ng/mL (ref 0.0–8.3)

## 2015-10-25 LAB — HEPATITIS C ANTIBODY: Hep C Virus Ab: 0.2 s/co ratio (ref 0.0–0.9)

## 2015-10-29 LAB — HEPATITIS C GENOTYPE

## 2015-11-05 MED FILL — AMLODIPINE BESYLATE 10 MG T: 10 | 30 days supply | Qty: 30 | Fill #0

## 2015-11-06 ENCOUNTER — Ambulatory Visit (HOSPITAL_COMMUNITY)
Admission: RE | Admit: 2015-11-06 | Discharge: 2015-11-06 | Disposition: A | Discharge status: Home / Self Care | Payer: Self-pay | Source: Ambulatory Visit | Attending: Hematology | Admitting: Hematology

## 2015-11-06 DIAGNOSIS — R932 Abnormal findings on diagnostic imaging of liver and biliary tract: Secondary | ICD-10-CM | POA: Insufficient documentation

## 2015-11-06 DIAGNOSIS — N200 Calculus of kidney: Secondary | ICD-10-CM | POA: Insufficient documentation

## 2015-11-06 DIAGNOSIS — K8020 Calculus of gallbladder without cholecystitis without obstruction: Secondary | ICD-10-CM | POA: Insufficient documentation

## 2015-11-06 DIAGNOSIS — B1920 Unspecified viral hepatitis C without hepatic coma: Secondary | ICD-10-CM | POA: Insufficient documentation

## 2015-11-06 MED FILL — $BYSTOLIC 10 MG TABLET: 10 | 90 days supply | Qty: 90 | Fill #1

## 2015-11-08 ENCOUNTER — Ambulatory Visit (HOSPITAL_COMMUNITY): Payer: Self-pay

## 2015-12-17 MED FILL — ?AMLODIPINE BESYLATE 10 MG: 10 | 30 days supply | Qty: 30 | Fill #1

## 2016-01-23 ENCOUNTER — Other Ambulatory Visit: Payer: Self-pay

## 2016-01-23 ENCOUNTER — Ambulatory Visit: Payer: Self-pay | Admitting: Hematology

## 2016-01-27 ENCOUNTER — Telehealth: Payer: Self-pay | Admitting: Hematology

## 2016-01-27 ENCOUNTER — Other Ambulatory Visit: Payer: Self-pay | Admitting: *Deleted

## 2016-01-27 ENCOUNTER — Other Ambulatory Visit (HOSPITAL_BASED_OUTPATIENT_CLINIC_OR_DEPARTMENT_OTHER): Payer: No Typology Code available for payment source

## 2016-01-27 ENCOUNTER — Encounter: Payer: Self-pay | Admitting: Hematology

## 2016-01-27 ENCOUNTER — Ambulatory Visit (HOSPITAL_BASED_OUTPATIENT_CLINIC_OR_DEPARTMENT_OTHER): Payer: No Typology Code available for payment source | Admitting: Hematology

## 2016-01-27 VITALS — BP 124/81 | HR 50 | Temp 97.8°F | Resp 18 | Ht 77.0 in | Wt 226.1 lb

## 2016-01-27 DIAGNOSIS — D696 Thrombocytopenia, unspecified: Secondary | ICD-10-CM

## 2016-01-27 DIAGNOSIS — K7460 Unspecified cirrhosis of liver: Secondary | ICD-10-CM

## 2016-01-27 LAB — CBC & DIFF AND RETIC
BASO%: 0.7 % (ref 0.0–2.0)
BASOS ABS: 0 10*3/uL (ref 0.0–0.1)
EOS ABS: 0.5 10*3/uL (ref 0.0–0.5)
EOS%: 11.1 % — AB (ref 0.0–7.0)
HEMATOCRIT: 42.1 % (ref 38.4–49.9)
HGB: 14.5 g/dL (ref 13.0–17.1)
IMMATURE RETIC FRACT: 1.6 % — AB (ref 3.00–10.60)
LYMPH%: 42 % (ref 14.0–49.0)
MCH: 29.2 pg (ref 27.2–33.4)
MCHC: 34.4 g/dL (ref 32.0–36.0)
MCV: 84.7 fL (ref 79.3–98.0)
MONO#: 0.5 10*3/uL (ref 0.1–0.9)
MONO%: 11.8 % (ref 0.0–14.0)
NEUT#: 1.5 10*3/uL (ref 1.5–6.5)
NEUT%: 34.4 % — AB (ref 39.0–75.0)
PLATELETS: 79 10*3/uL — AB (ref 140–400)
RBC: 4.97 10*6/uL (ref 4.20–5.82)
RDW: 15.6 % — ABNORMAL HIGH (ref 11.0–14.6)
RETIC CT ABS: 50.2 10*3/uL (ref 34.80–93.90)
Retic %: 1.01 % (ref 0.80–1.80)
WBC: 4.3 10*3/uL (ref 4.0–10.3)
lymph#: 1.8 10*3/uL (ref 0.9–3.3)

## 2016-01-27 LAB — COMPREHENSIVE METABOLIC PANEL
ALT: 30 U/L (ref 0–55)
ANION GAP: 7 meq/L (ref 3–11)
AST: 46 U/L — AB (ref 5–34)
Albumin: 3.2 g/dL — ABNORMAL LOW (ref 3.5–5.0)
Alkaline Phosphatase: 93 U/L (ref 40–150)
BILIRUBIN TOTAL: 0.71 mg/dL (ref 0.20–1.20)
BUN: 15 mg/dL (ref 7.0–26.0)
CALCIUM: 8.8 mg/dL (ref 8.4–10.4)
CO2: 24 meq/L (ref 22–29)
CREATININE: 1.2 mg/dL (ref 0.7–1.3)
Chloride: 111 mEq/L — ABNORMAL HIGH (ref 98–109)
EGFR: 79 mL/min/{1.73_m2} — ABNORMAL LOW (ref >90)
Glucose: 98 mg/dl (ref 70–140)
Potassium: 4.1 mEq/L (ref 3.5–5.1)
Sodium: 141 mEq/L (ref 136–145)
TOTAL PROTEIN: 7.8 g/dL (ref 6.4–8.3)

## 2016-01-27 NOTE — Telephone Encounter (Signed)
spoke w/ pt confirmed 1/24 apt times

## 2016-01-27 NOTE — Progress Notes (Signed)
Marland Kitchen    HEMATOLOGY/ONCOLOGY CONSULTATION NOTE  Date of Service: 01/27/2016  Patient Care Team: Lance Bosch, NP as PCP - General (Internal Medicine)  CHIEF COMPLAINTS/PURPOSE OF CONSULTATION:  Thrombocytopenia  HISTORY OF PRESENTING ILLNESS:  Timothy [name] is a wonderful 55 y.o. male who has been referred to [place] by Dr Feliciana Rossetti for evaluation and management of thrombocytopenia.  Patient has a history of hypertension, autoimmune hepatitis, liver cirrhosis. He notes that he was treated at [facility] for his ? Autoimmune hepatitis in [id number] but is uncertain about the exact treatment (appears he was treated with Azathioprine and was on diuretics). He notes that he hasn't had any recent follow-up her specialist or had any recent abdominal imaging since 2009.  He was referred to [place] for evaluation of thrombocytopenia after his recent labs with Dr. Feliciana Rossetti on 09/27/2015 showed a platelet count of 70k. In reviewing his previous labs in our system it appears that he has had thrombocytopenia in the range of 70-80k platelets since June 2009. He reports no bleeding. No epistaxis. No blood in the stools. No hematuria. No hemoptysis. Reports no new medications. No recent viral respiratory or gastrointestinal infections.  No other acute new symptoms. Notes that he is feeling well.   INTERVAL HISTORY  Timothy [name] is here with his wife for follow-up of his thrombocytopenia. He notes no bleeding. No new medications. His platelet counts today have improved from 62k on his last visit up to 79k . Denies any alcohol use. Otherwise feels well. Ultrasound abdomen showed nodular liver consistent with cirrhosis without significant splenomegaly. No other acute new symptoms.  MEDICAL HISTORY:  Past Medical History:  Diagnosis Date  . Hypertension   Liver cirrhosis  ?Autoimmune hepatitis treated with azathioprine in 2009. Was following with Dr. Shearon Stalls at [facility] gastroenterology.  SURGICAL HISTORY: No past surgical history on  file.  SOCIAL HISTORY: Social History   Social History  . Marital status: Single    Spouse name: N/A  . Number of children: N/A  . Years of education: N/A   Occupational History  . Not on file.   Social History Main Topics  . Smoking status: Never Smoker  . Smokeless tobacco: Not on file  . Alcohol use No  . Drug use: No  . Sexual activity: Not Currently   Other Topics Concern  . Not on file   Social History Narrative  . No narrative on file   Ex smoker quit 10 yrs ago, previously 1 PPD x 30-35 yrs. 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  Denies any family history of blood disorders or cancers.  ALLERGIES:  has No Known Allergies.  MEDICATIONS:  Current Outpatient Prescriptions  Medication Sig Dispense Refill  . amLODipine (NORVASC) 10 MG tablet Take 1 tablet (10 mg total) by mouth daily. Needs office visit for refills 90 tablet 1  . nebivolol (BYSTOLIC) 10 MG tablet Take 1 tablet (10 mg total) by mouth daily. 90 tablet 1   No current facility-administered medications for this visit.     REVIEW OF SYSTEMS:    10 Point review of Systems was done is negative except as noted above.  PHYSICAL EXAMINATION: ECOG PERFORMANCE STATUS: 1 - Symptomatic but completely ambulatory  . Vitals:   01/27/16 1022  BP: 124/81  Pulse: (!) 50  Resp: 18  Temp: 97.8 F (36.6 C)   Filed Weights   01/27/16 1022  Weight: 226 lb 1.6 oz (102.6 kg)   .  Body mass index is 26.81 kg/m.  GENERAL:alert, in no acute distress and comfortable SKIN: skin color, texture, turgor are normal, no rashes or significant lesions EYES: normal, conjunctiva are pink and non-injected, sclera clear OROPHARYNX:no exudate, no erythema and lips, buccal mucosa, and tongue normal  NECK: supple, no JVD, thyroid normal size, non-tender, without nodularity LYMPH:  no palpable lymphadenopathy in the cervical, axillary or inguinal LUNGS:  clear to auscultation with normal respiratory effort HEART: regular rate & rhythm,  no murmurs and no lower extremity edema ABDOMEN: abdomen soft, non-tender, normoactive bowel sounds . No overtly palpable hepatomegaly, borderline palpable splenomegaly. Musculoskeletal: no cyanosis of digits and no clubbing  PSYCH: alert & oriented x 3 with fluent speech NEURO: no focal motor/sensory deficits  LABORATORY DATA:  I have reviewed the data as listed  . CBC Latest Ref Rng & Units 01/27/2016 10/24/2015 09/27/2015  WBC 4.0 - 10.3 10e3/uL 4.3 4.8 4.8  Hemoglobin 13.0 - 17.1 g/dL 14.5 13.2 14.6  Hematocrit 38.4 - 49.9 % 42.1 38.9 43.1  Platelets 140 - 400 10e3/uL 79(L) 62 Large platelets present(L) 70(L)    . CMP Latest Ref Rng & Units 01/27/2016 10/24/2015 03/29/2015  Glucose 70 - 140 mg/dl 98 108 -  BUN 7.0 - 26.0 mg/dL 15.0 19.0 -  Creatinine 0.7 - 1.3 mg/dL 1.2 1.4(H) -  Sodium 136 - 145 mEq/L 141 144 -  Potassium 3.5 - 5.1 mEq/L 4.1 3.7 -  Chloride 101 - 111 mmol/L - - -  CO2 22 - 29 mEq/L 24 25 -  Calcium 8.4 - 10.4 mg/dL 8.8 9.0 -  Total Protein 6.4 - 8.3 g/dL 7.8 7.2 7.4  Total Bilirubin 0.20 - 1.20 mg/dL 0.71 0.65 0.8  Alkaline Phos 40 - 150 U/L 93 97 89  AST 5 - 34 U/L 46(H) 56(H) 51(H)  ALT 0 - 55 U/L 30 35 36      All  Component     Latest Ref Rng 10/24/2015  HCV Quant Baseline      HCV Not Detected  IU LOG10      Test Not Performed.  Test Information      Comment  Hcv Genotype      Test Not Performed.  Hep C Virus Ab     0.0 - 0.9 s/co ratio 0.2    Diagnosis  A. "LIVER BIOPSY" (TRANSJUGULAR BIOPSY): 02/27/2008       FEATURES OF MILD INTERFACE AND LOBULAR HEPATITIS. SEE COMMENT AND       MICROSCOPIC DESCRIPTION.     EVIDENCE OF MILD BILE DUCT INJURY.     MILD TO MODERATE CANALICULAR AND HEPATOCELLULAR CHOLESTASIS.     CIRRHOSIS.  COMMENT: From a morphologic perspective the underlying reason the patient's cirrhosis is not definitively evident. There appears  to be a biliary pattern of injury with cholestasis as well as interface and lobular hepatitis. The differential diagnosis includes, but is not limited to, autoimmune hepatitis with an overlap autoimmune cholangiopathy, the effects of medications/drugs/herbal remedies, infectious etiologies, primary and secondary biliary processes such as primary sclerosing cholangitis and secondary sclerosing cholangitis. Clinical and laboratory correlation are required. Comment:  I certify that I personally conducted the diagnostic evaluation of the above  specimen(s) and have rendered the above diagnosis(es).                             Docia Chuck, M.D.  RADIOGRAPHIC STUDIES: I have personally reviewed the radiological images as  listed and agreed with the findings in the report.  Ultrasound abdomen 11/06/2015  IMPRESSION: Nodular hepatic contour with coarse hepatic echotexture, suggesting cirrhosis. No focal hepatic lesion is seen.  Cholelithiasis, without associated sonographic findings to suggest acute cholecystitis.  Spleen is normal in size.  Bilateral nonobstructing renal calculi measuring up to 14 mm in the right upper pole. No hydronephrosis.   Electronically Signed   By: Julian Hy M.D.   On: 11/06/2015 13:00   ASSESSMENT & PLAN:   55 year old African-American gentleman with  #1 Chronic thrombocytopenia. Platelet counts for about 70-80K since June 2009. Recently his counts had slightly dropped to the 60-70K range on his last clinic visit but are back up to 79k today. Patient notes no issues with easy bruisability or overt bleeding. Has a history of  liver cirrhosis. 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 Ultrasound abdomen did not show any overt splenomegaly. Cannot rule out ITP No evidence of pseudothrombocytopenia on his smear.  #2 history of ? autoimmune hepatitis- was being treated by Dr. Shearon Stalls  at [facility] gastroenterology. Was previously on as of the upper and in 2009. Appears to have been lost to follow-up. Labs today show he is hepatitis C negative Alpha-fetoprotein was previously elevated to 20.5 on 02/24/2008 in the back down to 5.3 on 10/24/2015. Previous labs in 2009 showed he was hepatitis B negative, hepatitis C negative Anti-smooth muscle antibody screen negative Antimitochondrial antibody negative Patient's liver function tests are much better than in 2009 with only minor elevation in AST levels.  #3 history of liver cirrhosis - patient notes that he has not had a liver follow-up for his liver cirrhosis. He denies that he has been getting any hepatocellular carcinoma screening/follow-up since 2009.  Plan -No indication for treatment of the patient's thrombocytopenia at this time. -Avoid NSAIDs or other medications that might affect platelet function or numbers. -Continue every 6 monthly ultrasound abdomen and AFP tumor marker for hepatocellular carcinoma screening with primary care physician. -Repeat CBC with primary care physician in 3 months -he will likely need continued follow-up with hepatology-we'll coordinate this through his primary care physician.  Return to care with Dr. Irene Limbo in 6 months with CBC, CMP to monitor platelet counts. Earlier if any other concerning results obtained on initial testing.  Orders Placed This Encounter  Procedures  . CBC & Diff and Retic    Standing Status:   Future    Standing Expiration Date:   01/26/2017  . Comprehensive metabolic panel    Standing Status:   Future    Standing Expiration Date:   01/26/2017     All of the patients questions were answered with apparent satisfaction. The patient knows to call the clinic with any problems, questions or concerns.  I spent 20 minutes counseling the patient face to face. The total time spent in the appointment was 20 minutes and more than 50% was on counseling and direct patient cares.     Timothy Lone MD [place] AAHIVMS [facility] Hematology/Oncology Physician [facility]  (Office):       [phone] (Work cell):  [phone] (Fax):           [phone]  01/27/2016 12:57 PM

## 2016-02-24 MED FILL — $BYSTOLIC 10 MG TABLET: 10 | 30 days supply | Qty: 30 | Fill #2

## 2016-02-27 ENCOUNTER — Other Ambulatory Visit: Payer: Self-pay | Admitting: Pharmacist

## 2016-02-27 DIAGNOSIS — I10 Essential (primary) hypertension: Secondary | ICD-10-CM

## 2016-02-27 MED ORDER — NEBIVOLOL HCL 10 MG PO TABS: 10.0000 mg | ORAL_TABLET | Freq: Every day | ORAL | 0 refills | Status: DC

## 2016-03-18 ENCOUNTER — Other Ambulatory Visit: Payer: Self-pay | Admitting: *Deleted

## 2016-03-18 DIAGNOSIS — I10 Essential (primary) hypertension: Secondary | ICD-10-CM

## 2016-03-18 MED ORDER — NEBIVOLOL HCL 10 MG PO TABS: 10.0000 mg | ORAL_TABLET | Freq: Every day | ORAL | 0 refills | Status: DC

## 2016-03-18 MED FILL — $BYSTOLIC 10 MG TABLET: 10 | 90 days supply | Qty: 90 | Fill #0

## 2016-03-18 NOTE — Telephone Encounter (Signed)
Refilled for one last courtesy refill. Patient must schedule and keep an appointment in order to receive any more refills.

## 2016-07-29 ENCOUNTER — Ambulatory Visit: Payer: No Typology Code available for payment source | Admitting: Hematology

## 2016-07-29 ENCOUNTER — Other Ambulatory Visit: Payer: No Typology Code available for payment source

## 2016-10-15 ENCOUNTER — Other Ambulatory Visit: Payer: Self-pay | Admitting: Family Medicine

## 2016-10-15 DIAGNOSIS — I10 Essential (primary) hypertension: Secondary | ICD-10-CM

## 2016-10-15 MED ORDER — AMLODIPINE BESYLATE 10 MG PO TABS: 10.0000 mg | ORAL_TABLET | Freq: Every day | ORAL | 0 refills | Status: DC

## 2016-10-15 MED ORDER — NEBIVOLOL HCL 10 MG PO TABS: 10.0000 mg | ORAL_TABLET | Freq: Every day | ORAL | 0 refills | Status: DC

## 2016-10-15 MED FILL — ?AMLODIPINE BESYLATE 10 MG: 10 | 30 days supply | Qty: 30 | Fill #0

## 2016-10-15 MED FILL — BYSTOLIC 10 MG TABLET: 10 | 30 days supply | Qty: 30 | Fill #0

## 2016-10-15 NOTE — Telephone Encounter (Signed)
Will refill for 30 day supply until he can be seen in office.

## 2016-10-15 NOTE — Telephone Encounter (Signed)
Pt reqesting a refill of his nebivolol and amLODipine to last until his appt to est care on 10/29/16 @ 1:30pm. States that he is experiencing headaches and high blood pressure and needs his meds. Please f/u.

## 2016-10-19 NOTE — Telephone Encounter (Signed)
CMA call patient to inform that his RX is already been sent to the pharmacy   Patient did not answer but left a VM with a detailed message

## 2016-10-29 ENCOUNTER — Ambulatory Visit: Payer: Self-pay | Attending: Family Medicine | Admitting: Family Medicine

## 2016-10-29 VITALS — BP 110/68 | HR 65 | Temp 98.2°F | Resp 18 | Ht 74.0 in | Wt 232.8 lb

## 2016-10-29 DIAGNOSIS — D696 Thrombocytopenia, unspecified: Secondary | ICD-10-CM | POA: Insufficient documentation

## 2016-10-29 DIAGNOSIS — K754 Autoimmune hepatitis: Secondary | ICD-10-CM | POA: Insufficient documentation

## 2016-10-29 DIAGNOSIS — Z0000 Encounter for general adult medical examination without abnormal findings: Secondary | ICD-10-CM

## 2016-10-29 DIAGNOSIS — I10 Essential (primary) hypertension: Secondary | ICD-10-CM | POA: Insufficient documentation

## 2016-10-29 DIAGNOSIS — Z09 Encounter for follow-up examination after completed treatment for conditions other than malignant neoplasm: Secondary | ICD-10-CM

## 2016-10-29 DIAGNOSIS — K7460 Unspecified cirrhosis of liver: Secondary | ICD-10-CM | POA: Insufficient documentation

## 2016-10-29 LAB — POCT UA - MICROALBUMIN
CREATININE, POC: 200 mg/dL
MICROALBUMIN (UR) POC: 150 mg/L

## 2016-10-29 MED ORDER — AMLODIPINE BESYLATE 10 MG PO TABS: 10.0000 mg | ORAL_TABLET | Freq: Every day | ORAL | 0 refills | Status: DC

## 2016-10-29 MED ORDER — NEBIVOLOL HCL 10 MG PO TABS: 10.0000 mg | ORAL_TABLET | Freq: Every day | ORAL | 0 refills | Status: DC

## 2016-10-29 NOTE — Progress Notes (Signed)
Patient is here for f/up  Patient has taking His medication for today  Patient has eaten for today  Patient denies pain for today

## 2016-10-29 NOTE — Patient Instructions (Addendum)
Apply for orange card to complete referral process.   Heart-Healthy Eating Plan Heart-healthy meal planning includes:  Limiting unhealthy fats.  Increasing healthy fats.  Making other small dietary changes. You may need to talk with your doctor or a diet specialist (dietitian) to create an eating plan that is right for you. What types of fat should I choose?  Choose healthy fats. These include olive oil and canola oil, flaxseeds, walnuts, almonds, and seeds.  Eat more omega-3 fats. These include salmon, mackerel, sardines, tuna, flaxseed oil, and ground flaxseeds. Try to eat fish at least twice each week.  Limit saturated fats.  Saturated fats are often found in animal products, such as meats, butter, and cream.  Plant sources of saturated fats include palm oil, palm kernel oil, and coconut oil.  Avoid foods with partially hydrogenated oils in them. These include stick margarine, some tub margarines, cookies, crackers, and other baked goods. These contain trans fats. What general guidelines do I need to follow?  Check food labels carefully. Identify foods with trans fats or high amounts of saturated fat.  Fill one half of your plate with vegetables and green salads. Eat 4-5 servings of vegetables per day. A serving of vegetables is:  1 cup of raw leafy vegetables.   cup of raw or cooked cut-up vegetables.   cup of vegetable juice.  Fill one fourth of your plate with whole grains. Look for the word "whole" as the first word in the ingredient list.  Fill one fourth of your plate with lean protein foods.  Eat 4-5 servings of fruit per day. A serving of fruit is:  One medium whole fruit.   cup of dried fruit.   cup of fresh, frozen, or canned fruit.   cup of 100% fruit juice.  Eat more foods that contain soluble fiber. These include apples, broccoli, carrots, beans, peas, and barley. Try to get 20-30 g of fiber per day.  Eat more home-cooked food. Eat less  restaurant, buffet, and fast food.  Limit or avoid alcohol.  Limit foods high in starch and sugar.  Avoid fried foods.  Avoid frying your food. Try baking, boiling, grilling, or broiling it instead. You can also reduce fat by:  Removing the skin from poultry.  Removing all visible fats from meats.  Skimming the fat off of stews, soups, and gravies before serving them.  Steaming vegetables in water or broth.  Lose weight if you are overweight.  Eat 4-5 servings of nuts, legumes, and seeds per week:  One serving of dried beans or legumes equals  cup after being cooked.  One serving of nuts equals 1 ounces.  One serving of seeds equals  ounce or one tablespoon.  You may need to keep track of how much salt or sodium you eat. This is especially true if you have high blood pressure. Talk with your doctor or dietitian to get more information. What foods can I eat? Grains  Breads, including [place], white, pita, wheat, raisin, rye, oatmeal, and [place]. Tortillas that are neither fried nor made with lard or trans fat. Low-fat rolls, including hotdog and hamburger buns and English muffins. Biscuits. Muffins. Waffles. Pancakes. Light popcorn. Whole-grain cereals. Flatbread. Melba toast. Pretzels. Breadsticks. Rusks. Low-fat snacks. Low-fat crackers, including oyster, saltine, matzo, graham, animal, and rye. Rice and pasta, including brown rice and pastas that are made with whole wheat. Vegetables  All vegetables. Fruits  All fruits, but limit coconut. Meats and Other Protein Sources  Lean, well-trimmed beef,  veal, pork, and lamb. Chicken and [place] without skin. All fish and shellfish. Wild duck, rabbit, pheasant, and venison. Egg whites or low-cholesterol egg substitutes. Dried beans, peas, lentils, and tofu. Seeds and most nuts. Dairy  Low-fat or nonfat cheeses, including ricotta, string, and mozzarella. Skim or 1% milk that is liquid, powdered, or evaporated. Buttermilk that is made  with low-fat milk. Nonfat or low-fat yogurt. Beverages  Mineral water. Diet carbonated beverages. Sweets and Desserts  Sherbets and fruit ices. Honey, jam, marmalade, jelly, and syrups. Meringues and gelatins. Pure sugar candy, such as hard candy, jelly beans, gumdrops, mints, marshmallows, and small amounts of dark chocolate. [employer] Eat all sweets and desserts in moderation. Fats and Oils  Nonhydrogenated (trans-free) margarines. Vegetable oils, including soybean, sesame, sunflower, olive, peanut, safflower, corn, canola, and cottonseed. Salad dressings or mayonnaise made with a vegetable oil. Limit added fats and oils that you use for cooking, baking, salads, and as spreads. Other  Cocoa powder. Coffee and tea. All seasonings and condiments. The items listed above may not be a complete list of recommended foods or beverages. Contact your dietitian for more options.  What foods are not recommended? Grains  Breads that are made with saturated or trans fats, oils, or whole milk. Croissants. Butter rolls. Cheese breads. Sweet rolls. Donuts. Buttered popcorn. Chow mein noodles. High-fat crackers, such as cheese or butter crackers. Meats and Other Protein Sources  Fatty meats, such as hotdogs, short ribs, sausage, spareribs, bacon, rib eye roast or steak, and mutton. High-fat deli meats, such as salami and bologna. Caviar. Domestic duck and goose. Organ meats, such as kidney, liver, sweetbreads, and heart. Dairy  Cream, sour cream, cream cheese, and creamed cottage cheese. Whole-milk cheeses, including blue (bleu), Monterey Jack, Kell, [place], American, [place], Swiss, cheddar, [place], and [place]. Whole or 2% milk that is liquid, evaporated, or condensed. Whole buttermilk. Cream sauce or high-fat cheese sauce. Yogurt that is made from whole milk. Beverages  Regular sodas and juice drinks with added sugar. Sweets and Desserts  Frosting. Pudding. Cookies. Cakes other than angel food  cake. Candy that has milk chocolate or white chocolate, hydrogenated fat, butter, coconut, or unknown ingredients. Buttered syrups. Full-fat ice cream or ice cream drinks. Fats and Oils  Gravy that has suet, meat fat, or shortening. Cocoa butter, hydrogenated oils, palm oil, coconut oil, palm kernel oil. These can often be found in baked products, candy, fried foods, nondairy creamers, and whipped toppings. Solid fats and shortenings, including bacon fat, salt pork, lard, and butter. Nondairy cream substitutes, such as coffee creamers and sour cream substitutes. Salad dressings that are made of unknown oils, cheese, or sour cream. The items listed above may not be a complete list of foods and beverages to avoid. Contact your dietitian for more information.  This information is not intended to replace advice given to you by your health care provider. Make sure you discuss any questions you have with your health care provider. Document Released: 12/22/2011 Document Revised: 11/28/2015 Document Reviewed: 12/14/2013 Elsevier Interactive Patient Education  2017 [employer].

## 2016-10-29 NOTE — Progress Notes (Signed)
Subjective:  Patient ID: Timothy [name], male    DOB: 09/01/1960  Age: 56 y.o. MRN: [id number]  CC: Hypertension   HPI Timothy [name] presents for   History of HTN, liver cirrohosis, and autoimmune hepatitis which was treated in 2009. He reports being followed by GI specialist at [facility]. He did not follow up afterwards. He was referred to hematology in 2017 for evaluation of thrombocytopenia. He denies any abdominal pain, abdominal swelling, hematochezia, melena, N/V, or juandice. It was recommend he follow up in 6 months for repeat labs and imaging studies. He reports not following up.     Outpatient Medications Prior to Visit  Medication Sig Dispense Refill  . amLODipine (NORVASC) 10 MG tablet Take 1 tablet (10 mg total) by mouth daily. Needs office visit for refills 30 tablet 0  . nebivolol (BYSTOLIC) 10 MG tablet Take 1 tablet (10 mg total) by mouth daily. 30 tablet 0   No facility-administered medications prior to visit.     ROS Review of Systems  Constitutional: Negative.   Eyes: Negative.   Respiratory: Negative.   Cardiovascular: Negative.   Gastrointestinal:       History of hepatitis and cirrhosis.  Musculoskeletal: Negative.   Skin: Negative.   Neurological: Negative.    Objective:  BP 110/68 (BP Location: Left Arm, Patient Position: Sitting, Cuff Size: Normal)   Pulse 65   Temp 98.2 F (36.8 C) (Oral)   Resp 18   Ht _0  (1.88 m)   Wt 232 lb 12.8 oz (105.6 kg)   PF 97 L/min   BMI 29.89 kg/m   BP/Weight 10/29/2016 01/27/2016 6/31/4970  Systolic BP 263 785 885  Diastolic BP 68 81 80  Wt. (Lbs) 232.8 226.1 226.9  BMI 29.89 26.81 26.9    Physical Exam  Constitutional: He is oriented to person, place, and time. He appears well-developed and well-nourished.  HENT:  Head: Normocephalic and atraumatic.  Left Ear: External ear normal.  Nose: Nose normal.  Mouth/Throat: Oropharynx is clear and moist.  Eyes: Conjunctivae are normal. Pupils are equal, round, and  reactive to light. No scleral icterus.  Neck: Normal range of motion. Neck supple. No JVD present.  Cardiovascular: Normal rate, regular rhythm, normal heart sounds and intact distal pulses.   Pulmonary/Chest: Effort normal and breath sounds normal.  Abdominal: Soft. Bowel sounds are normal. There is no tenderness.  Neurological: He is alert and oriented to person, place, and time.  Skin: Skin is warm, dry and intact.  No jaundice present  Psychiatric: He has a normal mood and affect.  Nursing note and vitals reviewed.  Assessment & Plan:   Problem List Items Addressed This Visit      Digestive   Liver cirrhosis ([place]) - Primary   Relevant Orders   CMP14+EGFR (Completed)   CBC with Differential (Completed)   Lipid Panel (Completed)   US Abdomen Complete   Ambulatory referral to Hematology    Other Visit Diagnoses    Essential hypertension       BP well controlled on current medications at current dosage.   Relevant Medications   amLODipine (NORVASC) 10 MG tablet   nebivolol (BYSTOLIC) 10 MG tablet   Other Relevant Orders   CMP14+EGFR (Completed)   Lipid Panel (Completed)   POCT UA - Microalbumin (Completed)   Health care maintenance       Relevant Orders   Ambulatory referral to Gastroenterology   Follow up       Relevant Orders  Ambulatory referral to Hematology      Meds ordered this encounter  Medications  . amLODipine (NORVASC) 10 MG tablet    Sig: Take 1 tablet (10 mg total) by mouth daily. Needs office visit for refills    Dispense:  90 tablet    Refill:  0    Order Specific Question:   Supervising Provider    Answer:   Tresa Garter [id number]  . nebivolol (BYSTOLIC) 10 MG tablet    Sig: Take 1 tablet (10 mg total) by mouth daily.    Dispense:  90 tablet    Refill:  0    Must have office visit for refills    Order Specific Question:   Supervising Provider    Answer:   Tresa Garter [[id number]]    Follow-up: Return in about 1 month (around  11/28/2016) for Physical .   Alfonse Spruce FNP

## 2016-10-30 LAB — CMP14+EGFR
ALBUMIN: 3.3 g/dL — AB (ref 3.5–5.5)
ALK PHOS: 76 IU/L (ref 39–117)
ALT: 31 IU/L (ref 0–44)
AST: 49 IU/L — AB (ref 0–40)
Albumin/Globulin Ratio: 0.9 — ABNORMAL LOW (ref 1.2–2.2)
BILIRUBIN TOTAL: 0.7 mg/dL (ref 0.0–1.2)
BUN / CREAT RATIO: 16 (ref 9–20)
BUN: 17 mg/dL (ref 6–24)
CHLORIDE: 107 mmol/L — AB (ref 96–106)
CO2: 25 mmol/L (ref 18–29)
Calcium: 9.5 mg/dL (ref 8.7–10.2)
Creatinine, Ser: 1.07 mg/dL (ref 0.76–1.27)
GFR calc Af Amer: 89 mL/min/{1.73_m2} (ref >59)
GFR calc non Af Amer: 77 mL/min/{1.73_m2} (ref >59)
GLUCOSE: 111 mg/dL — AB (ref 65–99)
Globulin, Total: 3.8 g/dL (ref 1.5–4.5)
Potassium: 3.5 mmol/L (ref 3.5–5.2)
Sodium: 144 mmol/L (ref 134–144)
Total Protein: 7.1 g/dL (ref 6.0–8.5)

## 2016-10-30 LAB — CBC WITH DIFFERENTIAL/PLATELET
BASOS ABS: 0 10*3/uL (ref 0.0–0.2)
Basos: 1 %
EOS (ABSOLUTE): 0.4 10*3/uL (ref 0.0–0.4)
Eos: 8 %
HEMOGLOBIN: 13.5 g/dL (ref 13.0–17.7)
Hematocrit: 41.4 % (ref 37.5–51.0)
IMMATURE GRANS (ABS): 0 10*3/uL (ref 0.0–0.1)
IMMATURE GRANULOCYTES: 0 %
LYMPHS ABS: 1.9 10*3/uL (ref 0.7–3.1)
Lymphs: 43 %
MCH: 28.7 pg (ref 26.6–33.0)
MCHC: 32.6 g/dL (ref 31.5–35.7)
MCV: 88 fL (ref 79–97)
MONOCYTES: 11 %
Monocytes Absolute: 0.5 10*3/uL (ref 0.1–0.9)
Neutrophils Absolute: 1.6 10*3/uL (ref 1.4–7.0)
Neutrophils: 37 %
PLATELETS: 66 10*3/uL — AB (ref 150–379)
RBC: 4.71 x10E6/uL (ref 4.14–5.80)
RDW: 15.6 % — ABNORMAL HIGH (ref 12.3–15.4)
WBC: 4.4 10*3/uL (ref 3.4–10.8)

## 2016-10-30 LAB — LIPID PANEL
CHOLESTEROL TOTAL: 168 mg/dL (ref 100–199)
Chol/HDL Ratio: 2.7 ratio (ref 0.0–5.0)
HDL: 63 mg/dL (ref >39)
LDL CALC: 89 mg/dL (ref 0–99)
TRIGLYCERIDES: 81 mg/dL (ref 0–149)
VLDL CHOLESTEROL CAL: 16 mg/dL (ref 5–40)

## 2016-11-02 ENCOUNTER — Encounter: Payer: Self-pay | Admitting: Family Medicine

## 2016-11-02 ENCOUNTER — Other Ambulatory Visit: Payer: Self-pay | Admitting: Family Medicine

## 2016-11-02 ENCOUNTER — Telehealth: Payer: Self-pay | Admitting: Family Medicine

## 2016-11-02 DIAGNOSIS — Z8619 Personal history of other infectious and parasitic diseases: Secondary | ICD-10-CM

## 2016-11-02 DIAGNOSIS — D696 Thrombocytopenia, unspecified: Secondary | ICD-10-CM

## 2016-11-02 DIAGNOSIS — K7460 Unspecified cirrhosis of liver: Secondary | ICD-10-CM

## 2016-11-02 NOTE — Telephone Encounter (Signed)
Called and was able to inform patient of his lab results. He denies any bleeding or severe bruising. Given ED precautions. He communicates understanding. Told to avoid any strenuous activities.Urgent referral to hematology will be placed.

## 2016-11-05 ENCOUNTER — Ambulatory Visit (HOSPITAL_COMMUNITY): Admission: RE | Admit: 2016-11-05 | Payer: Self-pay | Source: Ambulatory Visit

## 2016-11-25 ENCOUNTER — Ambulatory Visit: Payer: Self-pay | Attending: Family Medicine | Admitting: Family Medicine

## 2016-11-25 VITALS — BP 127/88 | HR 63 | Temp 98.0°F | Resp 18 | Ht 74.0 in | Wt 228.8 lb

## 2016-11-25 DIAGNOSIS — Z09 Encounter for follow-up examination after completed treatment for conditions other than malignant neoplasm: Secondary | ICD-10-CM

## 2016-11-25 DIAGNOSIS — R809 Proteinuria, unspecified: Secondary | ICD-10-CM | POA: Insufficient documentation

## 2016-11-25 DIAGNOSIS — H6123 Impacted cerumen, bilateral: Secondary | ICD-10-CM | POA: Insufficient documentation

## 2016-11-25 DIAGNOSIS — Z8619 Personal history of other infectious and parasitic diseases: Secondary | ICD-10-CM | POA: Insufficient documentation

## 2016-11-25 DIAGNOSIS — I10 Essential (primary) hypertension: Secondary | ICD-10-CM | POA: Insufficient documentation

## 2016-11-25 DIAGNOSIS — Z8669 Personal history of other diseases of the nervous system and sense organs: Secondary | ICD-10-CM | POA: Insufficient documentation

## 2016-11-25 DIAGNOSIS — D696 Thrombocytopenia, unspecified: Secondary | ICD-10-CM | POA: Insufficient documentation

## 2016-11-25 DIAGNOSIS — Z0000 Encounter for general adult medical examination without abnormal findings: Secondary | ICD-10-CM | POA: Insufficient documentation

## 2016-11-25 LAB — POCT UA - MICROALBUMIN
Albumin/Creatinine Ratio, Urine, POC: >300
Creatinine, POC: 100 mg/dL
Microalbumin Ur, POC: 150 mg/L

## 2016-11-25 MED ORDER — CARBAMIDE PEROXIDE 6.5 % OT SOLN
10.0000 [drp] | Freq: Two times a day (BID) | OTIC | 0 refills | Status: DC
Start: 2016-11-25 — End: 2017-02-22

## 2016-11-25 MED ORDER — LISINOPRIL 2.5 MG PO TABS: 2.5000 mg | ORAL_TABLET | Freq: Every day | ORAL | 0 refills | Status: DC

## 2016-11-25 MED FILL — LISINOPRIL 2.5 MG TABLET: 2.5 | 30 days supply | Qty: 30 | Fill #0

## 2016-11-25 NOTE — Patient Instructions (Signed)
Lisinopril tablets  What is this medicine?  LISINOPRIL (lyse IN oh pril) is an ACE inhibitor. This medicine is used to treat high blood pressure and heart failure. It is also used to protect the heart immediately after a heart attack.  This medicine may be used for other purposes; ask your health care provider or pharmacist if you have questions.  COMMON BRAND NAME(S): Prinivil, Zestril  What should I tell my health care provider before I take this medicine?  They need to know if you have any of these conditions:  -diabetes  -heart or blood vessel disease  -kidney disease  -low blood pressure  -previous swelling of the tongue, face, or lips with difficulty breathing, difficulty swallowing, hoarseness, or tightening of the throat  -an unusual or allergic reaction to lisinopril, other ACE inhibitors, insect venom, foods, dyes, or preservatives  -pregnant or trying to get pregnant  -breast-feeding  How should I use this medicine?  Take this medicine by mouth with a glass of water. Follow the directions on your prescription label. You may take this medicine with or without food. If it upsets your stomach, take it with food. Take your medicine at regular intervals. Do not take it more often than directed. Do not stop taking except on your doctor's advice.  Talk to your pediatrician regarding the use of this medicine in children. Special care may be needed. While this drug may be prescribed for children as young as 6 years of age for selected conditions, precautions do apply.  Overdosage: If you think you have taken too much of this medicine contact a poison control center or emergency room at once.  NOTE: This medicine is only for you. Do not share this medicine with others.  What if I miss a dose?  If you miss a dose, take it as soon as you can. If it is almost time for your next dose, take only that dose. Do not take double or extra doses.  What may interact with this medicine?  Do not take this medicine with any of  the following medications:  -hymenoptera venom  -sacubitril; valsartan  This medicines may also interact with the following medications:  -aliskiren  -angiotensin receptor blockers, like losartan or valsartan  -certain medicines for diabetes  -diuretics  -everolimus  -gold compounds  -lithium  -NSAIDs, medicines for pain and inflammation, like ibuprofen or naproxen  -potassium salts or supplements  -salt substitutes  -sirolimus  -temsirolimus  This list may not describe all possible interactions. Give your health care provider a list of all the medicines, herbs, non-prescription drugs, or dietary supplements you use. Also tell them if you smoke, drink alcohol, or use illegal drugs. Some items may interact with your medicine.  What should I watch for while using this medicine?  Visit your doctor or health care professional for regular check ups. Check your blood pressure as directed. Ask your doctor what your blood pressure should be, and when you should contact him or her.  Do not treat yourself for coughs, colds, or pain while you are using this medicine without asking your doctor or health care professional for advice. Some ingredients may increase your blood pressure.  Women should inform their doctor if they wish to become pregnant or think they might be pregnant. There is a potential for serious side effects to an unborn child. Talk to your health care professional or pharmacist for more information.  Check with your doctor or health care professional if you get an   that needs mental alertness until you know how this drug affects you. Do not stand or sit up quickly, especially if you are an older patient. This reduces the risk of dizzy or fainting spells. Alcohol can make you more  drowsy and dizzy. Avoid alcoholic drinks. Avoid salt substitutes unless you are told otherwise by your doctor or health care professional. What side effects may I notice from receiving this medicine? Side effects that you should report to your doctor or health care professional as soon as possible: -allergic reactions like skin rash, itching or hives, swelling of the hands, feet, face, lips, throat, or tongue -breathing problems -signs and symptoms of kidney injury like trouble passing urine or change in the amount of urine -signs and symptoms of increased potassium like muscle weakness; chest pain; or fast, irregular heartbeat -signs and symptoms of liver injury like dark yellow or brown urine; general ill feeling or flu-like symptoms; light-colored stools; loss of appetite; nausea; right upper belly pain; unusually weak or tired; yellowing of the eyes or skin -signs and symptoms of low blood pressure like dizziness; feeling faint or lightheaded, falls; unusually weak or tired -stomach pain with or without nausea and vomiting Side effects that usually do not require medical attention (report to your doctor or health care professional if they continue or are bothersome): -changes in taste -cough -dizziness -fever -headache -sensitivity to light This list may not describe all possible side effects. Call your doctor for medical advice about side effects. You may report side effects to FDA at 1-800-FDA-1088. Where should I keep my medicine? Keep out of the reach of children. Store at room temperature between 15 and 30 degrees C (59 and 86 degrees F). Protect from moisture. Keep container tightly closed. Throw away any unused medicine after the expiration date. NOTE: This sheet is a summary. It may not cover all possible information. If you have questions about this medicine, talk to your doctor, pharmacist, or health care provider.  2018 Elsevier/Gold Standard (2015-08-12 12:52:35)  

## 2016-11-25 NOTE — Progress Notes (Signed)
Subjective:   Patient ID: Timothy [name], male    DOB: [birth year], 56 y.o.   MRN: [id number]  Chief Complaint  Patient presents with  . Annual Exam   HPI Timothy [name] 56 y.o. male presents for patient here for a comprehensive physical exam.The patient reports no problems. He denies taking any OTC  herbs or supplements that were not prescribed by a doctor. He denies any He is not exercising and is not adherent to low salt diet.  Blood pressure:  He does not take BP at home.  Cardiac symptoms none. Patient denies chest pain, claudication, dyspnea, lower extremity edema, near-syncope, palpitations and syncope.  Cardiovascular risk factors: advanced age (older than 51 for men, 83 for women), hypertension, male gender and sedentary lifestyle. He report quitting smoking 10 years ago. Use of agents associated with hypertension: none. History of target organ damage: none. He reports family history of hypertension: father; Denies any family history of DM or cancer. He wears corrective lenses he reports last eye exam was several years ago. He reports being unemployed for 7 years and had has attempted to file for disability in the past. Patient was offered opportunity to speak with LCSW regarding resources available, but patient declined.Patient has history  Hepatitis C. Patient has had prior treatment for Hepatitis C. Patient does have a past history of liver disease. Patient does not have a family history of liver disease.  Patient's current symptoms include thrombocytopenia. Patient denies abdominal pain, dark urine, fatigue, jaundice, malaise, myalgias, pruritis and vomiting.     Past Medical History:  Diagnosis Date  . Hypertension     No past surgical history on file.  No family history on file.  Social History   Social History  . Marital status: Single    Spouse name: N/A  . Number of children: N/A  . Years of education: N/A   Occupational History  . Not on file.   Social History Main Topics    . Smoking status: Never Smoker  . Smokeless tobacco: Not on file  . Alcohol use No  . Drug use: No  . Sexual activity: Not Currently   Other Topics Concern  . Not on file   Social History Narrative  . No narrative on file    Outpatient Medications Prior to Visit  Medication Sig Dispense Refill  . amLODipine (NORVASC) 10 MG tablet Take 1 tablet (10 mg total) by mouth daily. Needs office visit for refills 90 tablet 0  . nebivolol (BYSTOLIC) 10 MG tablet Take 1 tablet (10 mg total) by mouth daily. 90 tablet 0   No facility-administered medications prior to visit.     No Known Allergies  Review of Systems  Constitutional: Negative.   HENT: Negative.   Eyes:       Decreased visual acuity  Respiratory: Negative.   Cardiovascular: Negative.   Gastrointestinal: Negative.   Genitourinary: Negative.   Musculoskeletal: Negative.   Skin: Negative.   Neurological: Negative.   Endo/Heme/Allergies: Negative.   Psychiatric/Behavioral: Negative.       Objective:    Physical Exam  Constitutional: He is oriented to person, place, and time. He appears well-developed and well-nourished.  HENT:  Head: Normocephalic and atraumatic.  Right Ear: Hearing and external ear normal.  Left Ear: Hearing and external ear normal.  Nose: Nose normal.  Mouth/Throat: Oropharynx is clear and moist.  Ears:Excessive, soft cerumen to bilateral ears.  Eyes: Wears corrective lenses.  Eyes: Conjunctivae and EOM are normal. Pupils are equal, round,  and reactive to light.  Neck: Normal range of motion. Neck supple.  Cardiovascular: Normal rate, regular rhythm, normal heart sounds and intact distal pulses.   Pulmonary/Chest: Effort normal and breath sounds normal.  Abdominal: Soft. Bowel sounds are normal.  Musculoskeletal: Normal range of motion.  Neurological: He is alert and oriented to person, place, and time. He has normal reflexes.  Skin: Skin is warm and dry.  Psychiatric: He has a normal mood  and affect. His behavior is normal. Judgment and thought content normal.  Nursing note and vitals reviewed.   BP 127/88 (BP Location: Left Arm, Patient Position: Sitting, Cuff Size: Normal)   Pulse 63   Temp 98 F (36.7 C) (Oral)   Resp 18   Ht '6\' 2"'  (1.88 m)   Wt 228 lb 12.8 oz (103.8 kg)   SpO2 96%   BMI 29.38 kg/m  Wt Readings from Last 3 Encounters:  11/25/16 228 lb 12.8 oz (103.8 kg)  10/29/16 232 lb 12.8 oz (105.6 kg)  01/27/16 226 lb 1.6 oz (102.6 kg)     Lab Results  Component Value Date   TSH 3.050 11/25/2016   Lab Results  Component Value Date   WBC 4.7 11/25/2016   HGB 14.5 01/27/2016   HCT 42.5 11/25/2016   MCV 86 11/25/2016   PLT 71 (LL) 11/25/2016   Lab Results  Component Value Date   NA 139 11/25/2016   K 4.0 11/25/2016   CHLORIDE 111 (H) 01/27/2016   CO2 23 11/25/2016   GLUCOSE 105 (H) 11/25/2016   BUN 14 11/25/2016   CREATININE 1.13 11/25/2016   BILITOT 0.7 11/25/2016   ALKPHOS 96 11/25/2016   AST 52 (H) 11/25/2016   ALT 32 11/25/2016   PROT 7.9 11/25/2016   ALBUMIN 3.6 11/25/2016   CALCIUM 9.1 11/25/2016   ANIONGAP 7 01/27/2016   EGFR 79 (L) 01/27/2016   Lab Results  Component Value Date   CHOL 168 10/29/2016   CHOL 201 (H) 03/29/2015   CHOL 187 11/15/2012   Lab Results  Component Value Date   HDL 63 10/29/2016   HDL 73 03/29/2015   HDL 63 11/15/2012   Lab Results  Component Value Date   LDLCALC 89 10/29/2016   LDLCALC 114 03/29/2015   LDLCALC 109 (H) 11/15/2012   Lab Results  Component Value Date   TRIG 81 10/29/2016   TRIG 71 03/29/2015   TRIG 73 11/15/2012   Lab Results  Component Value Date   CHOLHDL 2.7 10/29/2016   CHOLHDL 2.8 03/29/2015   CHOLHDL 3.0 11/15/2012   Lab Results  Component Value Date   HGBA1C 6.0 (H) 11/25/2016   HGBA1C 5.40 03/29/2015   HGBA1C 5.6 11/15/2012       Assessment & Plan:   Problem List Items Addressed This Visit    None    Visit Diagnoses    Annual physical exam    -   Primary   Patient refused colonoscopy referral and in office FOBT    Relevant Orders   PSA (Completed)   TSH (Completed)   Hemoglobin A1c (Completed)   CBC with Differential (Completed)   CMP14+EGFR (Completed)   Vitamin D, 25-hydroxy (Completed)   POC Hemoccult Bld/Stl (3-Cd Home Screen)   Ambulatory referral to Dentistry   History of visual impairment       Vision acuity screen.    Relevant Orders   Ambulatory referral to Ophthalmology   Follow up       History of Hepatitis C,  liver cirrhosis, and thrombocytopenia. He denies following up with previous    Referrals.    Relevant Orders   Ambulatory referral to Hematology   Ambulatory referral to Gastroenterology   Excessive ear wax, bilateral       Relevant Medications   carbamide peroxide (DEBROX) 6.5 % otic solution   Essential hypertension       Schedule BP recheck in 2 weeks with clinic RN.   If BP is greater than 90/60 (MAP 65 or greater) but not less than 130/80 may increase dose    of amlodipine to 5 mg QD and recheck in another 2 weeks with clinic RN.    Relevant Medications   lisinopril (PRINIVIL,ZESTRIL) 2.5 MG tablet   Other Relevant Orders   POCT UA - Microalbumin (Completed)   Positive for microalbuminuria       Relevant Medications   lisinopril (PRINIVIL,ZESTRIL) 2.5 MG tablet     Meds ordered this encounter  Medications  . carbamide peroxide (DEBROX) 6.5 % otic solution    Sig: Place 10 drops into both ears 2 (two) times daily. For 4 days.    Dispense:  15 mL    Refill:  0    Order Specific Question:   Supervising Provider    Answer:   Tresa Garter [id number]  . lisinopril (PRINIVIL,ZESTRIL) 2.5 MG tablet    Sig: Take 1 tablet (2.5 mg total) by mouth daily.    Dispense:  90 tablet    Refill:  0    Order Specific Question:   Supervising Provider    Answer:   Tresa Garter [id number]    Follow up: Return in about 2 weeks (around 12/09/2016) for BP check with clinic RN.   Fredia Beets, FNP

## 2016-11-25 NOTE — Progress Notes (Signed)
Patient is here for physical  Patient has not eaten for today  Patient denies pain for today

## 2016-11-26 LAB — CMP14+EGFR
ALK PHOS: 96 IU/L (ref 39–117)
ALT: 32 IU/L (ref 0–44)
AST: 52 IU/L — AB (ref 0–40)
Albumin/Globulin Ratio: 0.8 — ABNORMAL LOW (ref 1.2–2.2)
Albumin: 3.6 g/dL (ref 3.5–5.5)
BUN/Creatinine Ratio: 12 (ref 9–20)
BUN: 14 mg/dL (ref 6–24)
Bilirubin Total: 0.7 mg/dL (ref 0.0–1.2)
CALCIUM: 9.1 mg/dL (ref 8.7–10.2)
CO2: 23 mmol/L (ref 18–29)
CREATININE: 1.13 mg/dL (ref 0.76–1.27)
Chloride: 104 mmol/L (ref 96–106)
GFR calc Af Amer: 84 mL/min/{1.73_m2} (ref >59)
GFR, EST NON AFRICAN AMERICAN: 72 mL/min/{1.73_m2} (ref >59)
GLUCOSE: 105 mg/dL — AB (ref 65–99)
Globulin, Total: 4.3 g/dL (ref 1.5–4.5)
Potassium: 4 mmol/L (ref 3.5–5.2)
Sodium: 139 mmol/L (ref 134–144)
Total Protein: 7.9 g/dL (ref 6.0–8.5)

## 2016-11-26 LAB — CBC WITH DIFFERENTIAL/PLATELET
BASOS ABS: 0 10*3/uL (ref 0.0–0.2)
Basos: 1 %
EOS (ABSOLUTE): 0.7 10*3/uL — AB (ref 0.0–0.4)
EOS: 14 %
Hematocrit: 42.5 % (ref 37.5–51.0)
Hemoglobin: 14.2 g/dL (ref 13.0–17.7)
IMMATURE GRANULOCYTES: 0 %
Immature Grans (Abs): 0 10*3/uL (ref 0.0–0.1)
Lymphocytes Absolute: 2 10*3/uL (ref 0.7–3.1)
Lymphs: 43 %
MCH: 28.8 pg (ref 26.6–33.0)
MCHC: 33.4 g/dL (ref 31.5–35.7)
MCV: 86 fL (ref 79–97)
MONOS ABS: 0.5 10*3/uL (ref 0.1–0.9)
Monocytes: 10 %
NEUTROS PCT: 32 %
Neutrophils Absolute: 1.5 10*3/uL (ref 1.4–7.0)
PLATELETS: 71 10*3/uL — AB (ref 150–379)
RBC: 4.93 x10E6/uL (ref 4.14–5.80)
RDW: 15.5 % — ABNORMAL HIGH (ref 12.3–15.4)
WBC: 4.7 10*3/uL (ref 3.4–10.8)

## 2016-11-26 LAB — TSH: TSH: 3.05 u[IU]/mL (ref 0.450–4.500)

## 2016-11-26 LAB — HEMOGLOBIN A1C
ESTIMATED AVERAGE GLUCOSE: 126 mg/dL
Hgb A1c MFr Bld: 6 % — ABNORMAL HIGH (ref 4.8–5.6)

## 2016-11-26 LAB — PSA: Prostate Specific Ag, Serum: 0.4 ng/mL (ref 0.0–4.0)

## 2016-11-26 LAB — VITAMIN D 25 HYDROXY (VIT D DEFICIENCY, FRACTURES): VIT D 25 HYDROXY: 34.8 ng/mL (ref 30.0–100.0)

## 2016-12-01 ENCOUNTER — Telehealth: Payer: Self-pay

## 2016-12-01 NOTE — Telephone Encounter (Signed)
Pt. Came to facility stating that he is taking nebivolol (BYSTOLIC) 10 MG tablet And was prescribed lisinopril (PRINIVIL,ZESTRIL) 2.5 MG tablet.  Pt. Does not know why he has been prescribed lisinopril and he is very upset.  Please f/u with pt.

## 2016-12-01 NOTE — Telephone Encounter (Signed)
CMA call regarding lab results   Patient Verify DOB   Patient was aware and understood  

## 2016-12-01 NOTE — Telephone Encounter (Signed)
Pt. Came to facility stating that he is taking nebivolol (BYSTOLIC) 10 MG tablet And was prescribed lisinopril (PRINIVIL,ZESTRIL) 2.5 MG tablet.  Pt. Does not know why he has been prescribed lisinopril and he is very upset.  Please advice .

## 2016-12-01 NOTE — Telephone Encounter (Signed)
-----   Message from Alfonse Spruce, [place] sent at 11/27/2016 11:47 AM EDT ----- Platelet count is low but stable since last check. It is important that you follow up with hematology and gastroenterology referrals.  Low platelets can reduce your bloods ability to clot. Avoid any heavy physical activity that will put you at risk of developing a bruise or cut. Do not take aspirin or products that contain aspirin.  PSA is normal. PSA is screens for prostate problems Thyroid function normal Kidney function normal Vitamin D level is normal. Labs that evaluated your blood cells, fluid and electrolyte balance are normal. No signs of anemia, recent infection, or inflammation present.

## 2016-12-02 ENCOUNTER — Other Ambulatory Visit: Payer: Self-pay | Admitting: Internal Medicine

## 2016-12-02 ENCOUNTER — Telehealth: Payer: Self-pay | Admitting: Family Medicine

## 2016-12-02 DIAGNOSIS — I10 Essential (primary) hypertension: Secondary | ICD-10-CM

## 2016-12-02 MED ORDER — AMLODIPINE BESYLATE 10 MG PO TABS: 10.0000 mg | ORAL_TABLET | Freq: Every day | ORAL | 0 refills | Status: DC

## 2016-12-02 MED ORDER — NEBIVOLOL HCL 10 MG PO TABS: 10.0000 mg | ORAL_TABLET | Freq: Every day | ORAL | 0 refills | Status: DC

## 2016-12-02 MED FILL — BYSTOLIC 10 MG TABLET: 10 | 30 days supply | Qty: 30 | Fill #0

## 2016-12-02 NOTE — Telephone Encounter (Signed)
Called patient to address concerns. It was explained that medications for HTN were not discontinued. However amlodipine and bystolic medications were prescribed on 10/29/2016 for 90 day supply. Patient denies picking up medications , therefore medications were placed back. Medication will be reordered. Explained to patient again why lisinopril was added due to history of microabuminuria. Patient communicates understanding and is agreeable to plan.

## 2016-12-03 MED FILL — AMLODIPINE BESYLATE 10 MG T: 10 | 30 days supply | Qty: 30 | Fill #0

## 2016-12-07 ENCOUNTER — Telehealth: Payer: Self-pay | Admitting: Hematology

## 2016-12-07 NOTE — Telephone Encounter (Signed)
Scheduled appt per sch message from Seth Bake - left message with appt date and time and sent reminder letter in the mail.

## 2016-12-14 ENCOUNTER — Ambulatory Visit: Payer: Self-pay | Admitting: Hematology

## 2017-01-01 ENCOUNTER — Encounter: Payer: Self-pay | Admitting: Family Medicine

## 2017-01-04 ENCOUNTER — Other Ambulatory Visit: Payer: Self-pay | Admitting: *Deleted

## 2017-01-04 DIAGNOSIS — I10 Essential (primary) hypertension: Secondary | ICD-10-CM

## 2017-01-04 MED ORDER — NEBIVOLOL HCL 10 MG PO TABS: 10.0000 mg | ORAL_TABLET | Freq: Every day | ORAL | 3 refills | Status: DC

## 2017-01-04 NOTE — Telephone Encounter (Signed)
PRINTED FOR PASS PROGRAM 

## 2017-01-14 MED FILL — ?AMLODIPINE BESYLATE 10 MG: 10 | 30 days supply | Qty: 30 | Fill #1

## 2017-01-14 MED FILL — BYSTOLIC 10 MG TABLET: 10 | 30 days supply | Qty: 30 | Fill #0

## 2017-02-16 MED FILL — $BYSTOLIC 10 MG TABLET: 10 | 30 days supply | Qty: 30 | Fill #1

## 2017-02-22 ENCOUNTER — Other Ambulatory Visit: Payer: Self-pay

## 2017-02-22 ENCOUNTER — Ambulatory Visit: Payer: Self-pay | Attending: Family Medicine | Admitting: Family Medicine

## 2017-02-22 VITALS — BP 122/77 | HR 60 | Temp 98.0°F | Resp 18 | Ht 73.0 in | Wt 239.6 lb

## 2017-02-22 DIAGNOSIS — K7460 Unspecified cirrhosis of liver: Secondary | ICD-10-CM | POA: Insufficient documentation

## 2017-02-22 DIAGNOSIS — M7989 Other specified soft tissue disorders: Secondary | ICD-10-CM | POA: Insufficient documentation

## 2017-02-22 DIAGNOSIS — K029 Dental caries, unspecified: Secondary | ICD-10-CM | POA: Insufficient documentation

## 2017-02-22 DIAGNOSIS — I440 Atrioventricular block, first degree: Secondary | ICD-10-CM | POA: Insufficient documentation

## 2017-02-22 DIAGNOSIS — R0602 Shortness of breath: Secondary | ICD-10-CM | POA: Insufficient documentation

## 2017-02-22 DIAGNOSIS — I10 Essential (primary) hypertension: Secondary | ICD-10-CM | POA: Insufficient documentation

## 2017-02-22 DIAGNOSIS — R600 Localized edema: Secondary | ICD-10-CM | POA: Insufficient documentation

## 2017-02-22 DIAGNOSIS — R001 Bradycardia, unspecified: Secondary | ICD-10-CM

## 2017-02-22 DIAGNOSIS — Z8719 Personal history of other diseases of the digestive system: Secondary | ICD-10-CM

## 2017-02-22 DIAGNOSIS — Z79899 Other long term (current) drug therapy: Secondary | ICD-10-CM | POA: Insufficient documentation

## 2017-02-22 DIAGNOSIS — K754 Autoimmune hepatitis: Secondary | ICD-10-CM | POA: Insufficient documentation

## 2017-02-22 MED ORDER — HYDROCHLOROTHIAZIDE 25 MG PO TABS: 25.0000 mg | ORAL_TABLET | Freq: Every day | ORAL | 2 refills | Status: DC

## 2017-02-22 MED ORDER — ACETAMINOPHEN 500 MG PO TABS: 1000.0000 mg | ORAL_TABLET | Freq: Four times a day (QID) | ORAL | 0 refills | Status: DC | PRN

## 2017-02-22 MED ORDER — PENICILLIN V POTASSIUM 500 MG PO TABS: 500.0000 mg | ORAL_TABLET | Freq: Four times a day (QID) | ORAL | 0 refills | Status: DC

## 2017-02-22 MED ORDER — MEDICAL COMPRESSION STOCKINGS MISC: 0 refills | Status: DC

## 2017-02-22 MED ORDER — LOSARTAN POTASSIUM 25 MG PO TABS: 50.0000 mg | ORAL_TABLET | Freq: Every day | ORAL | 2 refills | Status: DC

## 2017-02-22 MED FILL — LOSARTAN POTASSIUM 25 MG TA: 25 | 15 days supply | Qty: 30 | Fill #0

## 2017-02-22 NOTE — Progress Notes (Signed)
Subjective:  Patient ID: Timothy [name], male    DOB: 09-Jun-1961  Age: 56 y.o. MRN: [id number]  CC: Hypertension   HPI [name] presents for bilateral lower leg swelling. He reports symptoms began 2 weeks ago. He denies any history of injury, pain, or increased warmth to the BLE. He has a PMH that includes HTN, Autoimmune hepatitis, and Liver cirrhosis. He reports adherence with BP medications. History of thrombocytopenia, liver cirrhosis, and autoimmune hepatitis. Patient reports not following up with referrals. He does not check BP at home. Cardiac symptoms dyspnea and lower extremity edema. Patient denies chest pain, chest pressure/discomfort, claudication, near-syncope, orthopnea, palpitations, syncope and cough.  Cardiovascular risk factors: advanced age (older than 36 for men, 56 for women), dyslipidemia, hypertension, male gender, sedentary lifestyle and smoking/ tobacco exposure. Use of agents associated with hypertension: none. History of target organ damage: liver disease.   Outpatient Medications Prior to Visit  Medication Sig Dispense Refill  . nebivolol (BYSTOLIC) 10 MG tablet Take 1 tablet (10 mg total) by mouth daily. 90 tablet 3  . amLODipine (NORVASC) 10 MG tablet Take 1 tablet (10 mg total) by mouth daily. Needs office visit for refills 90 tablet 0  . carbamide peroxide (DEBROX) 6.5 % otic solution Place 10 drops into both ears 2 (two) times daily. For 4 days. 15 mL 0  . lisinopril (PRINIVIL,ZESTRIL) 2.5 MG tablet Take 1 tablet (2.5 mg total) by mouth daily. 90 tablet 0   No facility-administered medications prior to visit.     ROS Review of Systems  Constitutional: Negative.   HENT: Positive for dental problem.   Eyes: Negative.   Respiratory: Negative.   Cardiovascular: Negative.   Gastrointestinal: Negative.   Skin:       BLE edema  Psychiatric/Behavioral: Negative.    Objective:  BP 122/77 (BP Location: Left Arm, Patient Position: Sitting, Cuff Size: Normal)    Pulse 60   Temp 98 F (36.7 C) (Oral)   Resp 18   Ht '6\' 1"'  (1.854 m)   Wt 239 lb 9.6 oz (108.7 kg)   SpO2 95%   BMI 31.61 kg/m   BP/Weight 02/22/2017 11/25/2016 01/24/5871  Systolic BP 761 848 592  Diastolic BP 77 88 68  Wt. (Lbs) 239.6 228.8 232.8  BMI 31.61 29.38 29.89     Physical Exam  Constitutional: He appears well-developed and well-nourished.  HENT:  Mouth/Throat: Dental caries (decay) present.  Eyes: Pupils are equal, round, and reactive to light. Conjunctivae are normal.  Neck: No JVD present.  Cardiovascular: Normal rate, regular rhythm, normal heart sounds and intact distal pulses.   Pulmonary/Chest: Effort normal and breath sounds normal.  Abdominal: Soft. Bowel sounds are normal. There is no tenderness.  Skin: Skin is warm and dry.  BLE ankles 2+ pitting  Psychiatric: He is agitated. He expresses impulsivity. He expresses no homicidal and no suicidal ideation. He expresses no suicidal plans and no homicidal plans.  Nursing note and vitals reviewed.   Assessment & Plan:   Problem List Items Addressed This Visit      Digestive   Autoimmune hepatitis [facility])   Patient encouraged to follow up with referrals.   Relevant Orders   Ambulatory referral to Gastroenterology    Other Visit Diagnoses    Bilateral lower extremity edema    -  Primary   Discontinue amlodipine and lisinopril.   Relevant Medications   hydrochlorothiazide (HYDRODIURIL) 25 MG tablet   Elastic Bandages & Supports (MEDICAL COMPRESSION STOCKINGS) MISC  Other Relevant Orders   CMP14+EGFR (Completed)   CBC with Differential (Completed)   Brain natriuretic peptide (Completed)   ECHOCARDIOGRAM COMPLETE   Shortness of breath       Relevant Orders   DG Chest 2 View   Dental decay       Relevant Medications   penicillin v potassium (VEETID) 500 MG tablet   acetaminophen (TYLENOL) 500 MG tablet   Other Relevant Orders   Ambulatory referral to Dentistry   Essential hypertension        Relevant Medications   losartan (COZAAR) 25 MG tablet   hydrochlorothiazide (HYDRODIURIL) 25 MG tablet   History of cirrhosis of liver       Relevant Orders   Ambulatory referral to Gastroenterology   1st degree AV block       Relevant Medications   losartan (COZAAR) 25 MG tablet   hydrochlorothiazide (HYDRODIURIL) 25 MG tablet   Other Relevant Orders   ECHOCARDIOGRAM COMPLETE   Sinus bradycardia on ECG       Relevant Orders   ECHOCARDIOGRAM COMPLETE      Meds ordered this encounter  Medications  . losartan (COZAAR) 25 MG tablet    Sig: Take 2 tablets (50 mg total) by mouth daily.    Dispense:  30 tablet    Refill:  2    Order Specific Question:   Supervising Provider    Answer:   Tresa Garter [id number]  . penicillin v potassium (VEETID) 500 MG tablet    Sig: Take 1 tablet (500 mg total) by mouth 4 (four) times daily.    Dispense:  20 tablet    Refill:  0    Order Specific Question:   Supervising Provider    Answer:   Tresa Garter [id number]  . acetaminophen (TYLENOL) 500 MG tablet    Sig: Take 2 tablets (1,000 mg total) by mouth every 6 (six) hours as needed.    Dispense:  30 tablet    Refill:  0    Order Specific Question:   Supervising Provider    Answer:   Tresa Garter [id number]  . hydrochlorothiazide (HYDRODIURIL) 25 MG tablet    Sig: Take 1 tablet (25 mg total) by mouth daily.    Dispense:  30 tablet    Refill:  2    Order Specific Question:   Supervising Provider    Answer:   Tresa Garter [id number]  . Elastic Bandages & Supports (MEDICAL COMPRESSION STOCKINGS) MISC    Sig: APPLY STOCKINGS TO BILATERAL LOWER EXTREMITIES FOR SWELLING AND SUPPORT. TO BE FITTED BY MEDICAL SUPPLY.    Dispense:  2 each    Refill:  0    Order Specific Question:   Supervising Provider    Answer:   Tresa Garter [id number]    Follow-up: Return in about 2 weeks (around 03/08/2017), or if symptoms worsen or fail to improve, for BP check [place].    Alfonse Spruce FNP

## 2017-02-22 NOTE — Progress Notes (Signed)
Patient is here for f/up BP  Patient complains about both ankles swollen

## 2017-02-24 LAB — CMP14+EGFR
A/G RATIO: 0.9 — AB (ref 1.2–2.2)
ALT: 30 IU/L (ref 0–44)
AST: 48 IU/L — AB (ref 0–40)
Albumin: 3.5 g/dL (ref 3.5–5.5)
Alkaline Phosphatase: 85 IU/L (ref 39–117)
BILIRUBIN TOTAL: 0.7 mg/dL (ref 0.0–1.2)
BUN/Creatinine Ratio: 9 (ref 9–20)
BUN: 10 mg/dL (ref 6–24)
CALCIUM: 8.9 mg/dL (ref 8.7–10.2)
CHLORIDE: 106 mmol/L (ref 96–106)
CO2: 23 mmol/L (ref 20–29)
Creatinine, Ser: 1.16 mg/dL (ref 0.76–1.27)
GFR calc Af Amer: 80 mL/min/{1.73_m2} (ref >59)
GFR calc non Af Amer: 70 mL/min/{1.73_m2} (ref >59)
Globulin, Total: 3.8 g/dL (ref 1.5–4.5)
Glucose: 96 mg/dL (ref 65–99)
POTASSIUM: 3.9 mmol/L (ref 3.5–5.2)
Sodium: 141 mmol/L (ref 134–144)
Total Protein: 7.3 g/dL (ref 6.0–8.5)

## 2017-02-24 LAB — CBC WITH DIFFERENTIAL/PLATELET
BASOS ABS: 0 10*3/uL (ref 0.0–0.2)
Basos: 1 %
EOS (ABSOLUTE): 0.4 10*3/uL (ref 0.0–0.4)
Eos: 9 %
Hematocrit: 39.7 % (ref 37.5–51.0)
Hemoglobin: 12.9 g/dL — ABNORMAL LOW (ref 13.0–17.7)
IMMATURE GRANS (ABS): 0 10*3/uL (ref 0.0–0.1)
IMMATURE GRANULOCYTES: 0 %
LYMPHS: 42 %
Lymphocytes Absolute: 2.1 10*3/uL (ref 0.7–3.1)
MCH: 28.7 pg (ref 26.6–33.0)
MCHC: 32.5 g/dL (ref 31.5–35.7)
MCV: 88 fL (ref 79–97)
Monocytes Absolute: 0.6 10*3/uL (ref 0.1–0.9)
Monocytes: 12 %
NEUTROS PCT: 36 %
Neutrophils Absolute: 1.8 10*3/uL (ref 1.4–7.0)
PLATELETS: 87 10*3/uL — AB (ref 150–379)
RBC: 4.5 x10E6/uL (ref 4.14–5.80)
RDW: 16.1 % — ABNORMAL HIGH (ref 12.3–15.4)
WBC: 4.9 10*3/uL (ref 3.4–10.8)

## 2017-02-24 LAB — BRAIN NATRIURETIC PEPTIDE: BNP: 42.8 pg/mL (ref 0.0–100.0)

## 2017-03-02 ENCOUNTER — Telehealth: Payer: Self-pay

## 2017-03-02 ENCOUNTER — Other Ambulatory Visit: Payer: Self-pay | Admitting: Family Medicine

## 2017-03-02 DIAGNOSIS — D696 Thrombocytopenia, unspecified: Secondary | ICD-10-CM

## 2017-03-02 NOTE — Telephone Encounter (Signed)
CMA call regarding lab results   Patient did not answer but left a VM stating the reason of the call &  to call me back  

## 2017-03-02 NOTE — Telephone Encounter (Signed)
-----   Message from Alfonse Spruce, [place] sent at 03/02/2017 12:22 PM EDT ----- Platelet count is low but stable since last check. It is important that you follow up with hematology and gastroenterology referrals.  Low platelets can reduce your bloods ability to clot. Avoid any heavy physical activity that will put you at risk of developing a bruise or cut. Do not take aspirin or products that contain aspirin. BNP is normal. BNP labs can be elevated with heart failure. Kidney function normal Liver function is decreased but stable.

## 2017-03-11 ENCOUNTER — Ambulatory Visit: Payer: Self-pay | Attending: Family Medicine | Admitting: *Deleted

## 2017-03-11 VITALS — BP 152/100 | HR 62

## 2017-03-11 DIAGNOSIS — I10 Essential (primary) hypertension: Secondary | ICD-10-CM | POA: Insufficient documentation

## 2017-03-11 DIAGNOSIS — Z048 Encounter for examination and observation for other specified reasons: Secondary | ICD-10-CM | POA: Insufficient documentation

## 2017-03-11 NOTE — Progress Notes (Signed)
Pt arrived to [facility]. Pt alert and oriented and arrives in good spirits. Last OV .  Pt denies chest pain, SOB, HA, dizziness, or blurred vision.  Verified medication with patient. Pt states he did not take his   medication was taken this morning.  Manual blood pressure reading: 152/100  BP reading at home and states reading are good at home. He has appointment on Friday. Encouraged to take medication and recheck BP. Pt verbalized understanding.   136/90  He is not taking HCTZ

## 2017-03-12 ENCOUNTER — Ambulatory Visit: Payer: Self-pay | Attending: Family Medicine

## 2017-03-12 MED FILL — HYDROCHLOROTHIAZIDE 25 MG T: 25 | 30 days supply | Qty: 30 | Fill #0

## 2017-03-15 MED FILL — $BYSTOLIC 10 MG TABLET: 10 | 30 days supply | Qty: 30 | Fill #2

## 2017-03-15 MED FILL — LOSARTAN POTASSIUM 25 MG TA: 25 | 15 days supply | Qty: 30 | Fill #1

## 2017-03-30 MED FILL — LOSARTAN POTASSIUM 25 MG TA: 25 | 15 days supply | Qty: 30 | Fill #2

## 2017-04-05 ENCOUNTER — Telehealth: Payer: Self-pay | Admitting: Family Medicine

## 2017-04-05 NOTE — Telephone Encounter (Signed)
Pt called about his referral please fu at [phone]

## 2017-04-12 MED FILL — HYDROCHLOROTHIAZIDE 25 MG T: 25 | 30 days supply | Qty: 30 | Fill #1

## 2017-04-14 ENCOUNTER — Encounter: Payer: Self-pay | Admitting: Family Medicine

## 2017-04-14 ENCOUNTER — Ambulatory Visit: Payer: Self-pay | Attending: Family Medicine | Admitting: Family Medicine

## 2017-04-14 VITALS — BP 137/82 | HR 69 | Temp 97.9°F | Resp 18 | Ht 74.0 in | Wt 236.4 lb

## 2017-04-14 DIAGNOSIS — D696 Thrombocytopenia, unspecified: Secondary | ICD-10-CM | POA: Insufficient documentation

## 2017-04-14 DIAGNOSIS — Z862 Personal history of diseases of the blood and blood-forming organs and certain disorders involving the immune mechanism: Secondary | ICD-10-CM

## 2017-04-14 DIAGNOSIS — K754 Autoimmune hepatitis: Secondary | ICD-10-CM | POA: Insufficient documentation

## 2017-04-14 DIAGNOSIS — R600 Localized edema: Secondary | ICD-10-CM | POA: Insufficient documentation

## 2017-04-14 DIAGNOSIS — E785 Hyperlipidemia, unspecified: Secondary | ICD-10-CM | POA: Insufficient documentation

## 2017-04-14 DIAGNOSIS — K7460 Unspecified cirrhosis of liver: Secondary | ICD-10-CM | POA: Insufficient documentation

## 2017-04-14 DIAGNOSIS — I10 Essential (primary) hypertension: Secondary | ICD-10-CM | POA: Insufficient documentation

## 2017-04-14 MED ORDER — LOSARTAN POTASSIUM 25 MG PO TABS: 50.0000 mg | ORAL_TABLET | Freq: Every day | ORAL | 3 refills | Status: DC

## 2017-04-14 MED ORDER — HYDROCHLOROTHIAZIDE 25 MG PO TABS: 25.0000 mg | ORAL_TABLET | Freq: Every day | ORAL | 3 refills | Status: DC

## 2017-04-14 MED ORDER — NEBIVOLOL HCL 10 MG PO TABS: 10.0000 mg | ORAL_TABLET | Freq: Every day | ORAL | 3 refills | Status: DC

## 2017-04-14 MED FILL — $BYSTOLIC 10 MG TABLET: 10 | 30 days supply | Qty: 30 | Fill #0

## 2017-04-14 MED FILL — LOSARTAN POTASSIUM 25 MG TA: 25 | 30 days supply | Qty: 60 | Fill #0

## 2017-04-14 NOTE — Progress Notes (Signed)
estab

## 2017-04-14 NOTE — Progress Notes (Signed)
Subjective:  Patient ID: Timothy [name], male    DOB: [birth year]  Age: 56 y.o. MRN: [id number]  CC: Hypertension   HPI Timothy [name] presents for HTN/HLD follow up. He has a PMH that includes HTN, autoimmune hepatitis, and Liver cirrhosis. He reports adherence with BP medications. He reports swelling has improved. He does not check BP at home. Cardiac symptoms none. Patient denies chest pain, chest pressure/discomfort, claudication, lower extremity edema, near-syncope, orthopnea, palpitations and syncope.  Cardiovascular risk factors: advanced age (older than 35 for men, 65 for women), dyslipidemia, hypertension, male gender, sedentary lifestyle and smoking/ tobacco exposure. Use of agents associated with hypertension: none. History of target organ damage: liver disease.   Outpatient Medications Prior to Visit  Medication Sig Dispense Refill  . Elastic Bandages & Supports (MEDICAL COMPRESSION STOCKINGS) MISC APPLY STOCKINGS TO BILATERAL LOWER EXTREMITIES FOR SWELLING AND SUPPORT. TO BE FITTED BY MEDICAL SUPPLY. (Patient not taking: Reported on 03/11/2017) 2 each 0  . acetaminophen (TYLENOL) 500 MG tablet Take 2 tablets (1,000 mg total) by mouth every 6 (six) hours as needed. (Patient not taking: Reported on 03/11/2017) 30 tablet 0  . hydrochlorothiazide (HYDRODIURIL) 25 MG tablet Take 1 tablet (25 mg total) by mouth daily. (Patient not taking: Reported on 03/12/2017) 30 tablet 2  . losartan (COZAAR) 25 MG tablet Take 2 tablets (50 mg total) by mouth daily. (Patient not taking: Reported on 03/11/2017) 30 tablet 2  . nebivolol (BYSTOLIC) 10 MG tablet Take 1 tablet (10 mg total) by mouth daily. 90 tablet 3  . penicillin v potassium (VEETID) 500 MG tablet Take 1 tablet (500 mg total) by mouth 4 (four) times daily. (Patient not taking: Reported on 03/11/2017) 20 tablet 0   No facility-administered medications prior to visit.     ROS Review of Systems  Constitutional: Negative.   Respiratory: Negative.     Cardiovascular: Negative.   Gastrointestinal: Negative.   Skin: Negative.   Psychiatric/Behavioral: Negative.    Objective:  BP 137/87 (BP Location: Left Arm, Patient Position: Sitting, Cuff Size: Normal)   Pulse 69   Temp 97.9 F (36.6 C) (Oral)   Resp 18   Ht 6\' 2"  (1.88 m)   Wt 236 lb 6.4 oz (107.2 kg)   SpO2 96%   BMI 30.35 kg/m   BP/Weight 04/14/2017 03/11/2017 7/37/1062  Systolic BP 694 854 627  Diastolic BP 87 035 77  Wt. (Lbs) 236.4 - 239.6  BMI 30.35 - 31.61     Physical Exam  Constitutional: He appears well-developed and well-nourished.  Eyes: Pupils are equal, round, and reactive to light. Conjunctivae are normal.  Neck: No JVD present.  Cardiovascular: Normal rate, regular rhythm, normal heart sounds and intact distal pulses.   Pulmonary/Chest: Effort normal and breath sounds normal.  Abdominal: Soft. Bowel sounds are normal. There is no tenderness.  Skin: Skin is warm and dry.  Psychiatric: He expresses no homicidal and no suicidal ideation. He expresses no suicidal plans and no homicidal plans.  Nursing note and vitals reviewed.   Assessment & Plan:   1. Essential hypertension BP controlled on current dosage. - nebivolol (BYSTOLIC) 10 MG tablet; Take 1 tablet (10 mg total) by mouth daily.  Dispense: 30 tablet; Refill: 3 - losartan (COZAAR) 25 MG tablet; Take 2 tablets (50 mg total) by mouth daily.  Dispense: 30 tablet; Refill: 3  2. History of thrombocytopenia  - CBC  3. Bilateral lower extremity edema  Symptoms have improved with HCTZ use. - hydrochlorothiazide (HYDRODIURIL) 25  MG tablet; Take 1 tablet (25 mg total) by mouth daily.  Dispense: 30 tablet; Refill: 3    Meds ordered this encounter  Medications  . nebivolol (BYSTOLIC) 10 MG tablet    Sig: Take 1 tablet (10 mg total) by mouth daily.    Dispense:  30 tablet    Refill:  3    Order Specific Question:   Supervising Provider    Answer:   Tresa Garter [id number]  . losartan  (COZAAR) 25 MG tablet    Sig: Take 2 tablets (50 mg total) by mouth daily.    Dispense:  30 tablet    Refill:  3    Order Specific Question:   Supervising Provider    Answer:   Tresa Garter [id number]  . hydrochlorothiazide (HYDRODIURIL) 25 MG tablet    Sig: Take 1 tablet (25 mg total) by mouth daily.    Dispense:  30 tablet    Refill:  3    Order Specific Question:   Supervising Provider    Answer:   Tresa Garter [id number]    Follow-up: Return in about 3 months (around 07/15/2017) for HTN ?HLD.   Alfonse Spruce FNP

## 2017-04-14 NOTE — Progress Notes (Signed)
Patient is here for f/up  

## 2017-04-14 NOTE — Patient Instructions (Signed)
You will be called with your labs results. Follow up with GI referral: [facility] Phone: [phone] Address [address] [place], [place], [place] [address]  Follow up with hematology referral.    Hypertension Hypertension is another name for high blood pressure. High blood pressure forces your heart to work harder to pump blood. This can cause problems over time. There are two numbers in a blood pressure reading. There is a top number (systolic) over a bottom number (diastolic). It is best to have a blood pressure below 120/80. Healthy choices can help lower your blood pressure. You may need medicine to help lower your blood pressure if:  Your blood pressure cannot be lowered with healthy choices.  Your blood pressure is higher than 130/80.  Follow these instructions at home: Eating and drinking  If directed, follow the DASH eating plan. This diet includes: ? Filling half of your plate at each meal with fruits and vegetables. ? Filling one quarter of your plate at each meal with whole grains. Whole grains include whole wheat pasta, brown rice, and whole grain bread. ? Eating or drinking low-fat dairy products, such as skim milk or low-fat yogurt. ? Filling one quarter of your plate at each meal with low-fat (lean) proteins. Low-fat proteins include fish, skinless chicken, eggs, beans, and tofu. ? Avoiding fatty meat, cured and processed meat, or chicken with skin. ? Avoiding premade or processed food.  Eat less than 1,500 mg of salt (sodium) a day.  Limit alcohol use to no more than 1 drink a day for nonpregnant women and 2 drinks a day for men. One drink equals 12 oz of beer, 5 oz of wine, or 1 oz of hard liquor. Lifestyle  Work with your doctor to stay at a healthy weight or to lose weight. Ask your doctor what the best weight is for you.  Get at least 30 minutes of exercise that causes your heart to beat faster (aerobic exercise) most days of the week. This may include  walking, swimming, or biking.  Get at least 30 minutes of exercise that strengthens your muscles (resistance exercise) at least 3 days a week. This may include lifting weights or pilates.  Do not use any products that contain nicotine or tobacco. This includes cigarettes and e-cigarettes. If you need help quitting, ask your doctor.  Check your blood pressure at home as told by your doctor.  Keep all follow-up visits as told by your doctor. This is important. Medicines  Take over-the-counter and prescription medicines only as told by your doctor. Follow directions carefully.  Do not skip doses of blood pressure medicine. The medicine does not work as well if you skip doses. Skipping doses also puts you at risk for problems.  Ask your doctor about side effects or reactions to medicines that you should watch for. Contact a doctor if:  You think you are having a reaction to the medicine you are taking.  You have headaches that keep coming back (recurring).  You feel dizzy.  You have swelling in your ankles.  You have trouble with your vision. Get help right away if:  You get a very bad headache.  You start to feel confused.  You feel weak or numb.  You feel faint.  You get very bad pain in your: ? Chest. ? Belly (abdomen).  You throw up (vomit) more than once.  You have trouble breathing. Summary  Hypertension is another name for high blood pressure.  Making healthy choices can  help lower blood pressure. If your blood pressure cannot be controlled with healthy choices, you may need to take medicine. This information is not intended to replace advice given to you by your health care provider. Make sure you discuss any questions you have with your health care provider. Document Released: 12/09/2007 Document Revised: 05/20/2016 Document Reviewed: 05/20/2016 Elsevier Interactive Patient Education  [employer].

## 2017-04-16 LAB — CBC
HEMATOCRIT: 39.8 % (ref 37.5–51.0)
HEMOGLOBIN: 13.7 g/dL (ref 13.0–17.7)
MCH: 29.1 pg (ref 26.6–33.0)
MCHC: 34.4 g/dL (ref 31.5–35.7)
MCV: 85 fL (ref 79–97)
Platelets: 65 10*3/uL — CL (ref 150–379)
RBC: 4.71 x10E6/uL (ref 4.14–5.80)
RDW: 14.4 % (ref 12.3–15.4)
WBC: 3.5 10*3/uL (ref 3.4–10.8)

## 2017-04-20 ENCOUNTER — Ambulatory Visit: Payer: Self-pay | Attending: Physician Assistant | Admitting: Physician Assistant

## 2017-04-20 ENCOUNTER — Encounter: Payer: Self-pay | Admitting: Physician Assistant

## 2017-04-20 VITALS — BP 151/94 | HR 49 | Temp 98.0°F | Resp 18 | Ht 74.0 in | Wt 235.4 lb

## 2017-04-20 DIAGNOSIS — R202 Paresthesia of skin: Secondary | ICD-10-CM | POA: Insufficient documentation

## 2017-04-20 DIAGNOSIS — I10 Essential (primary) hypertension: Secondary | ICD-10-CM

## 2017-04-20 DIAGNOSIS — Z79899 Other long term (current) drug therapy: Secondary | ICD-10-CM | POA: Insufficient documentation

## 2017-04-20 DIAGNOSIS — M25561 Pain in right knee: Secondary | ICD-10-CM

## 2017-04-20 NOTE — Progress Notes (Signed)
Patient ID: Timothy [name], male   DOB: 26-Aug-1960, 56 y.o.   MRN: [id number]   Timothy [name], is a 56 y.o. male  [id number]:[id number]  [id number]  DOB - 08-Nov-1960  Subjective:  Chief Complaint and HPI: Timothy [name] is a 56 y.o. male here today for R knee and leg heaviness that started about 5 days ago but that is getting better.  He c/o awakening Thursday morning and his R leg felt heavy and he had to lifet/move his leg to get out of bed.  He has been a little unsteady walking since then until yesterday.  Today it seems mostly back to normal.  At the time, he also felt some numbness on his mid to distal R thigh and knee.  +minimal pain/stiffness/felt like his leg was "dangling".  He had no other pain.   No weakness.  No slurred speech.  No tripping/dropping things.  No h/o gout.  No f/c.  Has felt otherwise fine.  Symptoms began the day after starting HCTZ.    ROS:   Constitutional:  No f/c, No night sweats, No unexplained weight loss. EENT:  No vision changes, No blurry vision, No hearing changes. No mouth, throat, or ear problems.  Respiratory: No cough, No SOB Cardiac: No CP, no palpitations GI:  No abd pain, No N/V/D. GU: No Urinary s/sx Musculoskeletal: +/- R knee stiffness/pain Neuro: No headache, no dizziness, no motor weakness.  Skin: No rash Endocrine:  No polydipsia. No polyuria.  Psych: Denies SI/HI  No problems updated.  ALLERGIES: No Known Allergies  PAST MEDICAL HISTORY: Past Medical History:  Diagnosis Date  . Hypertension     MEDICATIONS AT HOME: Prior to Admission medications   Medication Sig Start Date End Date Taking? Authorizing Provider  hydrochlorothiazide (HYDRODIURIL) 25 MG tablet Take 1 tablet (25 mg total) by mouth daily. 04/14/17  Yes Hairston, Mandesia R, FNP  losartan (COZAAR) 25 MG tablet Take 2 tablets (50 mg total) by mouth daily. 04/14/17  Yes Hairston, Mandesia R, FNP  nebivolol (BYSTOLIC) 10 MG tablet Take 1 tablet (10 mg total) by mouth daily. 04/14/17   Yes Hairston, Maylon Peppers, FNP  Elastic Bandages & Supports ([place]) MISC APPLY STOCKINGS TO BILATERAL LOWER EXTREMITIES FOR SWELLING AND SUPPORT. TO BE FITTED BY MEDICAL SUPPLY. Patient not taking: Reported on 03/11/2017 02/22/17   Alfonse Spruce, FNP     Objective:  EXAM:   Vitals:   04/20/17 1135  BP: (!) 151/94  Pulse: (!) 49  Resp: 18  Temp: 98 F (36.7 C)  TempSrc: Oral  SpO2: 95%  Weight: 235 lb 6.4 oz (106.8 kg)  Height: 6\' 2"  (1.88 m)    General appearance : A&OX3. NAD. Non-toxic-appearing.  Normal gait w/o assistance HEENT: Atraumatic and Normocephalic.  PERRLA. EOM intact.  TM clear B. Mouth-MMM, post pharynx WNL w/o erythema, No PND. Neck: supple, no JVD. No cervical lymphadenopathy. No thyromegaly Chest/Lungs:  Breathing-non-labored, Good air entry bilaterally, breath sounds normal without rales, rhonchi, or wheezing  CVS: S1 S2 regular, no murmurs, gallops, rubs  Extremities: Bilateral Lower Ext shows no edema, both legs are warm to touch with = pulse throughout Neurology:  CN II-XII grossly intact, Non focal.  He has good facial symmetry and good symmetry of B U/L extremity in all planes.  No calf TTP, erythema, or swelling.  Neg Homan's B.   R knee joint is stable.  No ballotment/effusion.  Sensory =B. No erythema.  Full S&ROM Psych:  TP linear. J/I WNL.  Normal speech. Appropriate eye contact and affect.  Skin:  No Rash  Data Review Lab Results  Component Value Date   HGBA1C 6.0 (H) 11/25/2016   HGBA1C 5.40 03/29/2015   HGBA1C 5.6 11/15/2012     Assessment & Plan   1.  Paresthesias of Right knee/leg-resolving spontaneously.  No weakness.  ?possible shingles prodrome He certainly has risk factors for CVA but no neurological findings today.  I have urged them to go to the ED if he develops any sudden weakness or signs of CVA.   - Comprehensive metabolic panel - Uric Acid  2. Essential hypertension Uncontrolled but just added  HCTZ so no new additions today.    Patient have been counseled extensively about nutrition and exercise  Return if symptoms worsen or fail to improve.  F/up with PCP next planned OV/sooner if needed.    The patient was given clear instructions to go to ER or return to medical center if symptoms don't improve, worsen or new problems develop. The patient verbalized understanding. The patient was told to call to get lab results if they haven't heard anything in the next week.     Freeman Caldron, PA-C [facility] and [place], [place]   04/20/2017, 1:15 PM

## 2017-04-21 LAB — URIC ACID: URIC ACID: 6.5 mg/dL (ref 3.7–8.6)

## 2017-04-21 LAB — COMPREHENSIVE METABOLIC PANEL
ALK PHOS: 67 IU/L (ref 39–117)
ALT: 27 IU/L (ref 0–44)
AST: 46 IU/L — AB (ref 0–40)
Albumin/Globulin Ratio: 0.9 — ABNORMAL LOW (ref 1.2–2.2)
Albumin: 3.6 g/dL (ref 3.5–5.5)
BILIRUBIN TOTAL: 0.7 mg/dL (ref 0.0–1.2)
BUN / CREAT RATIO: 14 (ref 9–20)
BUN: 18 mg/dL (ref 6–24)
CHLORIDE: 106 mmol/L (ref 96–106)
CO2: 23 mmol/L (ref 20–29)
CREATININE: 1.25 mg/dL (ref 0.76–1.27)
Calcium: 9.5 mg/dL (ref 8.7–10.2)
GFR calc Af Amer: 73 mL/min/{1.73_m2} (ref >59)
GFR calc non Af Amer: 64 mL/min/{1.73_m2} (ref >59)
GLUCOSE: 126 mg/dL — AB (ref 65–99)
Globulin, Total: 3.9 g/dL (ref 1.5–4.5)
Potassium: 4 mmol/L (ref 3.5–5.2)
Sodium: 141 mmol/L (ref 134–144)
Total Protein: 7.5 g/dL (ref 6.0–8.5)

## 2017-04-22 ENCOUNTER — Telehealth: Payer: Self-pay | Admitting: *Deleted

## 2017-04-22 NOTE — Telephone Encounter (Signed)
Medical Assistant left message on patient's home and cell voicemail. Voicemail states to give a call back to [place] with [facility] at [phone]. !!!Please inform patient of blood work not showing evidence of gout. Patient potassium and sodium are normal. Patients kidney and liver function are also normal. Patient should limit sweet and carb intake due to high sugar being noted in blood work. Patient needs to follow up as planned!!!

## 2017-04-22 NOTE — Telephone Encounter (Signed)
-----   Message from [place] Donovan, [place] sent at 04/21/2017 10:21 PM EDT ----- Please call patient.  His labs did not show any evidence of gout.  His potassium and sodium were normal.  His kidney and liver function were normal.  His blood sugar was a little high.  He should cut back on sweets/white carbs but this is not related to the problem he is having with his knee. Follow up as planned. Thanks, Freeman Caldron, PA-C

## 2017-05-06 ENCOUNTER — Telehealth: Payer: Self-pay

## 2017-05-06 NOTE — Telephone Encounter (Signed)
-----   Message from Alfonse Spruce, [place] sent at 05/04/2017  3:52 PM EDT ----- Platelet count is low. It is important that you follow up with hematology and gastroenterology referrals.  Low platelets can reduce your bloods ability to clot. Avoid any heavy physical activity that will put you at risk of developing a bruise or cut. Do not take aspirin or products that contain aspirin.

## 2017-05-06 NOTE — Telephone Encounter (Signed)
CMA call regarding lab results   Patient verify DOB  Patient was aware and understood  

## 2017-05-10 ENCOUNTER — Telehealth: Payer: Self-pay | Admitting: Hematology

## 2017-05-10 NOTE — Telephone Encounter (Signed)
Spoke with patient regarding appointments. Patient scheduled 05/2017.

## 2017-05-12 ENCOUNTER — Encounter: Payer: Self-pay | Admitting: Family Medicine

## 2017-05-12 MED FILL — HYDROCHLOROTHIAZIDE 25 MG T: 25 | 30 days supply | Qty: 30 | Fill #2

## 2017-05-12 MED FILL — LOSARTAN POTASSIUM 25 MG TA: 25 | 30 days supply | Qty: 60 | Fill #1

## 2017-05-13 ENCOUNTER — Ambulatory Visit (HOSPITAL_BASED_OUTPATIENT_CLINIC_OR_DEPARTMENT_OTHER): Payer: Self-pay | Admitting: Hematology

## 2017-05-13 ENCOUNTER — Encounter: Payer: Self-pay | Admitting: Hematology

## 2017-05-13 VITALS — BP 133/74 | HR 57 | Temp 98.7°F | Resp 17 | Ht 74.0 in | Wt 230.2 lb

## 2017-05-13 DIAGNOSIS — K7460 Unspecified cirrhosis of liver: Secondary | ICD-10-CM

## 2017-05-13 DIAGNOSIS — K754 Autoimmune hepatitis: Secondary | ICD-10-CM

## 2017-05-13 DIAGNOSIS — D696 Thrombocytopenia, unspecified: Secondary | ICD-10-CM

## 2017-05-13 NOTE — Telephone Encounter (Signed)
Patient is already been seen by  Oncology at [facility]

## 2017-05-13 NOTE — Progress Notes (Signed)
[facility]    HEMATOLOGY/ONCOLOGY CONSULTATION NOTE  Date of Service: 05/13/2017  Patient Care Team: Alfonse Spruce, FNP as PCP - General (Family Medicine)  CHIEF COMPLAINTS/PURPOSE OF CONSULTATION:  Thrombocytopenia  HISTORY OF PRESENTING ILLNESS:  Timothy [name] is a wonderful 56 y.o. male who has been referred to [place] by Dr Feliciana Rossetti for evaluation and management of thrombocytopenia.  Patient has a history of hypertension, autoimmune hepatitis, liver cirrhosis. He notes that he was treated at [facility] for his ? Autoimmune hepatitis in 9924 but is uncertain about the exact treatment (appears he was treated with Azathioprine and was on diuretics). He notes that he hasn't had any recent follow-up her specialist or had any recent abdominal imaging since 2009.  He was referred to [place] for evaluation of thrombocytopenia after his recent labs with Dr. Feliciana Rossetti on 09/27/2015 showed a platelet count of 70k. In reviewing his previous labs in our system it appears that he has had thrombocytopenia in the range of 70-80k platelets since June 2009. He reports no bleeding. No epistaxis. No blood in the stools. No hematuria. No hemoptysis. Reports no new medications. No recent viral respiratory or gastrointestinal infections.  No other acute new symptoms. Notes that he is feeling well.   INTERVAL HISTORY  Timothy [name] is here with his wife for follow-up of his thrombocytopenia. He notes no bleeding. No new medications. His platelet counts recently have fallen slightly to 65k from 87k. Denies any alcohol use. He does report that he has been constipated recently, but otherwise he feels well. He does report that he has not been drinking enough water, but he denies any issues with fluid retention within the extremities or abdomen.   MEDICAL HISTORY:  Past Medical History:  Diagnosis Date  . Hypertension   Liver cirrhosis  ?Autoimmune hepatitis treated with azathioprine in 2009. Was following with Dr. Shearon Stalls at [facility]  gastroenterology.  SURGICAL HISTORY: History reviewed. No pertinent surgical history.  SOCIAL HISTORY: Social History   Socioeconomic History  . Marital status: Single    Spouse name: Not on file  . Number of children: Not on file  . Years of education: Not on file  . Highest education level: Not on file  Social Needs  . Financial resource strain: Not on file  . Food insecurity - worry: Not on file  . Food insecurity - inability: Not on file  . Transportation needs - medical: Not on file  . Transportation needs - non-medical: Not on file  Occupational History  . Not on file  Tobacco Use  . Smoking status: Never Smoker  . Smokeless tobacco: Never Used  Substance and Sexual Activity  . Alcohol use: No    Alcohol/week: 0.0 oz  . Drug use: No  . Sexual activity: Not Currently  Other Topics Concern  . Not on file  Social History Narrative  . Not on file   Ex smoker quit 10 yrs ago, previously 1 PPD x 30-35 yrs. Cocaine ex 19 yrs Construction type work painting/remodelling   FAMILY HISTORY: History reviewed. No pertinent family history.  Denies any family history of blood disorders or cancers.  ALLERGIES:  has No Known Allergies.  MEDICATIONS:  Current Outpatient Medications  Medication Sig Dispense Refill  . Elastic Bandages & Supports (MEDICAL COMPRESSION STOCKINGS) MISC APPLY STOCKINGS TO BILATERAL LOWER EXTREMITIES FOR SWELLING AND SUPPORT. TO BE FITTED BY MEDICAL SUPPLY. 2 each 0  . hydrochlorothiazide (HYDRODIURIL) 25 MG tablet Take 1 tablet (25 mg total) by mouth daily. 30 tablet  3  . losartan (COZAAR) 25 MG tablet Take 2 tablets (50 mg total) by mouth daily. 30 tablet 3  . nebivolol (BYSTOLIC) 10 MG tablet Take 1 tablet (10 mg total) by mouth daily. 30 tablet 3   No current facility-administered medications for this visit.     REVIEW OF SYSTEMS:    10 Point review of Systems was done is negative except as noted above.  PHYSICAL EXAMINATION: ECOG  PERFORMANCE STATUS: 1 - Symptomatic but completely ambulatory  . Vitals:   05/13/17 1357  BP: 133/74  Pulse: (!) 57  Resp: 17  Temp: 98.7 F (37.1 C)  SpO2: 100%   Filed Weights   05/13/17 1357  Weight: 230 lb 3.2 oz (104.4 kg)   .Body mass index is 29.56 kg/m.  GENERAL:alert, in no acute distress and comfortable SKIN: skin color, texture, turgor are normal, no rashes or significant lesions EYES: normal, conjunctiva are pink and non-injected, sclera clear OROPHARYNX:no exudate, no erythema and lips, buccal mucosa, and tongue normal  NECK: supple, no JVD, thyroid normal size, non-tender, without nodularity LYMPH:  no palpable lymphadenopathy in the cervical, axillary or inguinal LUNGS: clear to auscultation with normal respiratory effort HEART: regular rate & rhythm,  no murmurs and no lower extremity edema ABDOMEN: abdomen soft, non-tender, normoactive bowel sounds . No overtly palpable hepatomegaly, borderline palpable splenomegaly. Musculoskeletal: no cyanosis of digits and no clubbing  PSYCH: alert & oriented x 3 with fluent speech NEURO: no focal motor/sensory deficits  LABORATORY DATA:  I have reviewed the data as listed  . CBC Latest Ref Rng & Units 04/14/2017 02/22/2017 11/25/2016  WBC 3.4 - 10.8 x10E3/uL 3.5 4.9 4.7  Hemoglobin 13.0 - 17.7 g/dL 13.7 12.9(L) 14.2  Hematocrit 37.5 - 51.0 % 39.8 39.7 42.5  Platelets 150 - 379 x10E3/uL 65(LL) 87(LL) 71(LL)    . CMP Latest Ref Rng & Units 04/20/2017 02/22/2017 11/25/2016  Glucose 65 - 99 mg/dL 126(H) 96 105(H)  BUN 6 - 24 mg/dL 18 10 14   Creatinine 0.76 - 1.27 mg/dL 1.25 1.16 1.13  Sodium 134 - 144 mmol/L 141 141 139  Potassium 3.5 - 5.2 mmol/L 4.0 3.9 4.0  Chloride 96 - 106 mmol/L 106 106 104  CO2 20 - 29 mmol/L 23 23 23   Calcium 8.7 - 10.2 mg/dL 9.5 8.9 9.1  Total Protein 6.0 - 8.5 g/dL 7.5 7.3 7.9  Total Bilirubin 0.0 - 1.2 mg/dL 0.7 0.7 0.7  Alkaline Phos 39 - 117 IU/L 67 85 96  AST 0 - 40 IU/L 46(H) 48(H)  52(H)  ALT 0 - 44 IU/L 27 30 32     Component     Latest Ref Rng 10/24/2015  HCV Quant Baseline      HCV Not Detected  IU LOG10      Test Not Performed.  Test Information      Comment  Hcv Genotype      Test Not Performed.  Hep C Virus Ab     0.0 - 0.9 s/co ratio 0.2    Diagnosis  A. "LIVER BIOPSY" (TRANSJUGULAR BIOPSY): 02/27/2008       FEATURES OF MILD INTERFACE AND LOBULAR HEPATITIS. SEE COMMENT AND       MICROSCOPIC DESCRIPTION.     EVIDENCE OF MILD BILE DUCT INJURY.     MILD TO MODERATE CANALICULAR AND HEPATOCELLULAR CHOLESTASIS.     CIRRHOSIS.  COMMENT: From a morphologic perspective the underlying reason the patient's cirrhosis is not definitively evident. There appears to be a  biliary pattern of injury with cholestasis as well as interface and lobular hepatitis. The differential diagnosis includes, but is not limited to, autoimmune hepatitis with an overlap autoimmune cholangiopathy, the effects of medications/drugs/herbal remedies, infectious etiologies, primary and secondary biliary processes such as primary sclerosing cholangitis and secondary sclerosing cholangitis. Clinical and laboratory correlation are required. Comment:  I certify that I personally conducted the diagnostic evaluation of the above  specimen(s) and have rendered the above diagnosis(es).                             Docia Chuck, M.D.  RADIOGRAPHIC STUDIES: I have personally reviewed the radiological images as listed and agreed with the findings in the report.  Ultrasound abdomen 11/06/2015  IMPRESSION: Nodular hepatic contour with coarse hepatic echotexture, suggesting cirrhosis. No focal hepatic lesion is seen.  Cholelithiasis, without associated sonographic findings to suggest acute cholecystitis.  Spleen is normal in size.  Bilateral nonobstructing renal calculi measuring up to 14 mm in the right upper pole. No hydronephrosis.   Electronically Signed   By: Julian Hy M.D.   On: 11/06/2015 13:00   ASSESSMENT & PLAN:   56 year old African-American gentleman with  #1 Chronic thrombocytopenia. Platelet counts for about 70-80K since June 2009. Recently his counts had improved to 79K but have fallen again last month to 65K.  Patient notes no issues with easy bruisability or overt bleeding. Has a history of  liver cirrhosis. 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 Ultrasound abdomen did not show any overt splenomegaly. Cannot rule out ITP No evidence of pseudothrombocytopenia on his smear.  #2 history of ? autoimmune hepatitis- was being treated by Dr. Shearon Stalls at [facility] gastroenterology. Was previously on as of the upper and in 2009. Appears to have been lost to follow-up. Labs today show he is hepatitis C negative Alpha-fetoprotein was previously elevated to 20.5 on 02/24/2008 in the back down to 5.3 on 10/24/2015. Previous labs in 2009 showed he was hepatitis B negative, hepatitis C negative Anti-smooth muscle antibody screen negative Antimitochondrial antibody negative Patient's liver function tests are much better than in 2009 with only minor elevation in AST levels.  #3 history of liver cirrhosis - patient notes that he has not had a liver follow-up for his liver cirrhosis. He denies that he has been getting any hepatocellular carcinoma screening/follow-up since 2009.  Plan -No indication for treatment of the patient's thrombocytopenia at this time. -Avoid NSAIDs or other medications that might affect platelet function or numbers. -I encouraged him to avoid alcohol.  -Continue every 6 monthly ultrasound abdomen and AFP tumor marker for hepatocellular carcinoma screening with primary care physician. -Repeat CBC with primary care physician in 3 months -he will likely need continued follow-up with hepatology-we would recommend coordinating this through his primary care physician. -If  his platelets continue to trend downwards <30K with no bleeding or <50K with concerns for bleeding- I would recommend Nplate injections or consideration of promacta  Return to care with Dr. Irene Limbo in 6 months with labs. Earlier if any other concerning results obtained.  Orders Placed This Encounter  Procedures  . CBC & Diff and Retic    Standing Status:   Future    Standing Expiration Date:   05/13/2018  . Comprehensive metabolic panel    Standing Status:   Future    Standing Expiration Date:   05/13/2018   All of the  patients questions were answered with apparent satisfaction. The patient knows to call the clinic with any problems, questions or concerns.  I spent 20 minutes counseling the patient face to face. The total time spent in the appointment was 25 minutes and more than 50% was on counseling and direct patient cares.    Sullivan Lone MD [place] AAHIVMS [facility] Hematology/Oncology Physician [place]  (Office):       [phone] (Work cell):  [phone] (Fax):           [phone]  05/13/2017 2:13 PM  This document serves as a record of services personally performed by Sullivan Lone, MD. It was created on his behalf by Reola Mosher, a trained medical scribe. The creation of this record is based on the scribe's personal observations and the provider's statements to them. This document has been checked and approved by the attending provider.  .I have reviewed the above documentation for accuracy and completeness, and I agree with the above. Brunetta Genera MD MS

## 2017-05-25 NOTE — Telephone Encounter (Signed)
This encounter was created in error - please disregard.

## 2017-06-15 ENCOUNTER — Other Ambulatory Visit: Payer: Self-pay | Admitting: Family Medicine

## 2017-06-15 DIAGNOSIS — I10 Essential (primary) hypertension: Secondary | ICD-10-CM

## 2017-06-15 MED FILL — HYDROCHLOROTHIAZIDE 25 MG T: 25 | 30 days supply | Qty: 30 | Fill #0

## 2017-06-15 MED FILL — $BYSTOLIC 10 MG TABLET: 10 | 30 days supply | Qty: 30 | Fill #1

## 2017-06-18 ENCOUNTER — Other Ambulatory Visit: Payer: Self-pay | Admitting: Family Medicine

## 2017-06-18 DIAGNOSIS — I10 Essential (primary) hypertension: Secondary | ICD-10-CM

## 2017-06-21 ENCOUNTER — Other Ambulatory Visit: Payer: Self-pay | Admitting: Family Medicine

## 2017-07-12 DIAGNOSIS — I10 Essential (primary) hypertension: Secondary | ICD-10-CM

## 2017-07-14 MED FILL — $BYSTOLIC 10 MG TABLET: 10 | 30 days supply | Qty: 30 | Fill #2

## 2017-07-15 ENCOUNTER — Ambulatory Visit: Payer: Self-pay | Attending: Family Medicine | Admitting: Family Medicine

## 2017-07-15 ENCOUNTER — Encounter: Payer: Self-pay | Admitting: Family Medicine

## 2017-07-15 VITALS — BP 159/89 | HR 54 | Temp 98.7°F | Resp 16 | Ht 73.0 in | Wt 233.0 lb

## 2017-07-15 DIAGNOSIS — E785 Hyperlipidemia, unspecified: Secondary | ICD-10-CM | POA: Insufficient documentation

## 2017-07-15 DIAGNOSIS — K7460 Unspecified cirrhosis of liver: Secondary | ICD-10-CM

## 2017-07-15 DIAGNOSIS — I10 Essential (primary) hypertension: Secondary | ICD-10-CM

## 2017-07-15 DIAGNOSIS — Z79899 Other long term (current) drug therapy: Secondary | ICD-10-CM | POA: Insufficient documentation

## 2017-07-15 DIAGNOSIS — K754 Autoimmune hepatitis: Secondary | ICD-10-CM

## 2017-07-15 DIAGNOSIS — D696 Thrombocytopenia, unspecified: Secondary | ICD-10-CM

## 2017-07-15 MED ORDER — NEBIVOLOL HCL 10 MG PO TABS: 10.0000 mg | ORAL_TABLET | Freq: Every day | ORAL | 3 refills | Status: DC

## 2017-07-15 MED ORDER — HYDROCHLOROTHIAZIDE 25 MG PO TABS: 25.0000 mg | ORAL_TABLET | Freq: Every day | ORAL | 3 refills | Status: DC

## 2017-07-15 MED ORDER — LOSARTAN POTASSIUM 25 MG PO TABS: 50.0000 mg | ORAL_TABLET | Freq: Every day | ORAL | 3 refills | Status: DC

## 2017-07-15 MED FILL — LOSARTAN POTASSIUM 25 MG TA: 25 | 30 days supply | Qty: 60 | Fill #0

## 2017-07-15 NOTE — Progress Notes (Signed)
Subjective:  Patient ID: Timothy [name], male    DOB: 1961/06/05  Age: 57 y.o. MRN: [id number]  CC: Follow-up   HPI Timothy [name] presents for HTN/HLD follow up. He has a PMH that includes HTN, autoimmune hepatitis, and Liver cirrhosis. He reports adherence with BP medications.  He does not check BP at home. He reports not taking BP medication in one month. Cardiac symptoms none. Patient denies chest pain, chest pressure/discomfort, claudication, lower extremity edema, near-syncope, orthopnea, palpitations and syncope.  Cardiovascular risk factors: advanced age (older than 42 for men, 36 for women), dyslipidemia, hypertension, male gender, sedentary lifestyle and smoking/ tobacco exposure. Use of agents associated with hypertension: none. History of target organ damage: liver disease. He denies any ETOH use, he reports not drinking in 20 years. He reports following up with hematology specialist.    Outpatient Medications Prior to Visit  Medication Sig Dispense Refill  . hydrochlorothiazide (HYDRODIURIL) 25 MG tablet Take 1 tablet (25 mg total) by mouth daily. 30 tablet 3  . nebivolol (BYSTOLIC) 10 MG tablet Take 1 tablet (10 mg total) by mouth daily. 30 tablet 3  . Elastic Bandages & Supports (MEDICAL COMPRESSION STOCKINGS) MISC APPLY STOCKINGS TO BILATERAL LOWER EXTREMITIES FOR SWELLING AND SUPPORT. TO BE FITTED BY MEDICAL SUPPLY. (Patient not taking: Reported on 07/15/2017) 2 each 0  . losartan (COZAAR) 25 MG tablet Take 2 tablets (50 mg total) by mouth daily. (Patient not taking: Reported on 07/15/2017) 30 tablet 3   No facility-administered medications prior to visit.     ROS Review of Systems  Constitutional: Negative.   Respiratory: Negative.   Cardiovascular: Negative.   Gastrointestinal: Negative.   Skin: Negative.   Psychiatric/Behavioral: Negative.    Objective:  BP (!) 159/89 (BP Location: Right Arm, Cuff Size: Normal)   Pulse (!) 54   Temp 98.7 F (37.1 C) (Oral)   Resp 16   Ht  6\' 1"  (1.854 m)   Wt 233 lb (105.7 kg)   SpO2 97%   BMI 30.74 kg/m   BP/Weight 07/15/2017 05/13/2017 88/50/2774  Systolic BP 128 786 767  Diastolic BP 89 74 94  Wt. (Lbs) 233 230.2 235.4  BMI 30.74 29.56 30.22   Physical Exam  Constitutional: He appears well-developed and well-nourished.  Eyes: Conjunctivae are normal. Pupils are equal, round, and reactive to light.  Neck: No JVD present.  Cardiovascular: Normal rate, regular rhythm, normal heart sounds and intact distal pulses.  Pulmonary/Chest: Effort normal and breath sounds normal.  Abdominal: Soft. Bowel sounds are normal. There is no tenderness.  Skin: Skin is warm and dry.  Psychiatric: He expresses no homicidal and no suicidal ideation. He expresses no suicidal plans and no homicidal plans.  Nursing note and vitals reviewed.   Assessment & Plan:   1. Essential hypertension Schedule BP recheck in 2 weeks with nurse. If BP is greater than 90/60 (MAP 65 or greater) but not less than 130/80 may increase dose of losartan to 100 mg QD and recheck in another 2 weeks.  Follow up with PCP in 2 months. - losartan (COZAAR) 25 MG tablet; Take 2 tablets (50 mg total) by mouth daily.  Dispense: 30 tablet; Refill: 3 - nebivolol (BYSTOLIC) 10 MG tablet; Take 1 tablet (10 mg total) by mouth daily.  Dispense: 30 tablet; Refill: 3 - hydrochlorothiazide (HYDRODIURIL) 25 MG tablet; Take 1 tablet (25 mg total) by mouth daily.  Dispense: 30 tablet; Refill: 3 - Basic metabolic panel  2. Cirrhosis of liver without ascites,  unspecified hepatic cirrhosis type ([place])  - Amb Referral to Hepatology - AFP tumor marker - US Abdomen Complete; Future - CBC  3. Autoimmune hepatitis ([place])  - Amb Referral to Hepatology - AFP tumor marker - US Abdomen Complete; Future  4. Thrombocytopenia ([place])   - CBC    Follow-up: Return in about 2 weeks (around 07/29/2017) for BP check with [place].   Alfonse Spruce FNP

## 2017-07-15 NOTE — Progress Notes (Signed)
Patient is here for a follow-up on blood pressure. Patient stated he need medication refills.

## 2017-07-15 NOTE — Patient Instructions (Signed)
Managing Your Hypertension Hypertension is commonly called high blood pressure. This is when the force of your blood pressing against the walls of your arteries is too strong. Arteries are blood vessels that carry blood from your heart throughout your body. Hypertension forces the heart to work harder to pump blood, and may cause the arteries to become narrow or stiff. Having untreated or uncontrolled hypertension can cause heart attack, stroke, kidney disease, and other problems. What are blood pressure readings? A blood pressure reading consists of a higher number over a lower number. Ideally, your blood pressure should be below 120/80. The first ("top") number is called the systolic pressure. It is a measure of the pressure in your arteries as your heart beats. The second ("bottom") number is called the diastolic pressure. It is a measure of the pressure in your arteries as the heart relaxes. What does my blood pressure reading mean? Blood pressure is classified into four stages. Based on your blood pressure reading, your health care provider may use the following stages to determine what type of treatment you need, if any. Systolic pressure and diastolic pressure are measured in a unit called mm Hg. Normal  Systolic pressure: below 120.  Diastolic pressure: below 80. Elevated  Systolic pressure: 120-129.  Diastolic pressure: below 80. Hypertension stage 1  Systolic pressure: 130-139.  Diastolic pressure: 80-89. Hypertension stage 2  Systolic pressure: 140 or above.  Diastolic pressure: 90 or above. What health risks are associated with hypertension? Managing your hypertension is an important responsibility. Uncontrolled hypertension can lead to:  A heart attack.  A stroke.  A weakened blood vessel (aneurysm).  Heart failure.  Kidney damage.  Eye damage.  Metabolic syndrome.  Memory and concentration problems.  What changes can I make to manage my  hypertension? Hypertension can be managed by making lifestyle changes and possibly by taking medicines. Your health care provider will help you make a plan to bring your blood pressure within a normal range. Eating and drinking  Eat a diet that is high in fiber and potassium, and low in salt (sodium), added sugar, and fat. An example eating plan is called the DASH (Dietary Approaches to Stop Hypertension) diet. To eat this way: ? Eat plenty of fresh fruits and vegetables. Try to fill half of your plate at each meal with fruits and vegetables. ? Eat whole grains, such as whole wheat pasta, brown rice, or whole grain bread. Fill about one quarter of your plate with whole grains. ? Eat low-fat diary products. ? Avoid fatty cuts of meat, processed or cured meats, and poultry with skin. Fill about one quarter of your plate with lean proteins such as fish, chicken without skin, beans, eggs, and tofu. ? Avoid premade and processed foods. These tend to be higher in sodium, added sugar, and fat.  Reduce your daily sodium intake. Most people with hypertension should eat less than 1,500 mg of sodium a day.  Limit alcohol intake to no more than 1 drink a day for nonpregnant women and 2 drinks a day for men. One drink equals 12 oz of beer, 5 oz of wine, or 1 oz of hard liquor. Lifestyle  Work with your health care provider to maintain a healthy body weight, or to lose weight. Ask what an ideal weight is for you.  Get at least 30 minutes of exercise that causes your heart to beat faster (aerobic exercise) most days of the week. Activities may include walking, swimming, or biking.  Include exercise   to strengthen your muscles (resistance exercise), such as weight lifting, as part of your weekly exercise routine. Try to do these types of exercises for 30 minutes at least 3 days a week.  Do not use any products that contain nicotine or tobacco, such as cigarettes and e-cigarettes. If you need help quitting, ask  your health care provider.  Control any long-term (chronic) conditions you have, such as high cholesterol or diabetes. Monitoring  Monitor your blood pressure at home as told by your health care provider. Your personal target blood pressure may vary depending on your medical conditions, your age, and other factors.  Have your blood pressure checked regularly, as often as told by your health care provider. Working with your health care provider  Review all the medicines you take with your health care provider because there may be side effects or interactions.  Talk with your health care provider about your diet, exercise habits, and other lifestyle factors that may be contributing to hypertension.  Visit your health care provider regularly. Your health care provider can help you create and adjust your plan for managing hypertension. Will I need medicine to control my blood pressure? Your health care provider may prescribe medicine if lifestyle changes are not enough to get your blood pressure under control, and if:  Your systolic blood pressure is 130 or higher.  Your diastolic blood pressure is 80 or higher.  Take medicines only as told by your health care provider. Follow the directions carefully. Blood pressure medicines must be taken as prescribed. The medicine does not work as well when you skip doses. Skipping doses also puts you at risk for problems. Contact a health care provider if:  You think you are having a reaction to medicines you have taken.  You have repeated (recurrent) headaches.  You feel dizzy.  You have swelling in your ankles.  You have trouble with your vision. Get help right away if:  You develop a severe headache or confusion.  You have unusual weakness or numbness, or you feel faint.  You have severe pain in your chest or abdomen.  You vomit repeatedly.  You have trouble breathing. Summary  Hypertension is when the force of blood pumping through  your arteries is too strong. If this condition is not controlled, it may put you at risk for serious complications.  Your personal target blood pressure may vary depending on your medical conditions, your age, and other factors. For most people, a normal blood pressure is less than 120/80.  Hypertension is managed by lifestyle changes, medicines, or both. Lifestyle changes include weight loss, eating a healthy, low-sodium diet, exercising more, and limiting alcohol. This information is not intended to replace advice given to you by your health care provider. Make sure you discuss any questions you have with your health care provider. Document Released: 03/16/2012 Document Revised: 05/20/2016 Document Reviewed: 05/20/2016 Elsevier Interactive Patient Education  2018 [employer]  

## 2017-07-16 LAB — CBC
Hematocrit: 42.1 % (ref 37.5–51.0)
Hemoglobin: 14 g/dL (ref 13.0–17.7)
MCH: 28.6 pg (ref 26.6–33.0)
MCHC: 33.3 g/dL (ref 31.5–35.7)
MCV: 86 fL (ref 79–97)
PLATELETS: 63 10*3/uL — AB (ref 150–379)
RBC: 4.9 x10E6/uL (ref 4.14–5.80)
RDW: 15.6 % — AB (ref 12.3–15.4)
WBC: 4.3 10*3/uL (ref 3.4–10.8)

## 2017-07-16 LAB — BASIC METABOLIC PANEL
BUN/Creatinine Ratio: 15 (ref 9–20)
BUN: 15 mg/dL (ref 6–24)
CALCIUM: 9.3 mg/dL (ref 8.7–10.2)
CHLORIDE: 107 mmol/L — AB (ref 96–106)
CO2: 24 mmol/L (ref 20–29)
Creatinine, Ser: 1.03 mg/dL (ref 0.76–1.27)
GFR calc Af Amer: 93 mL/min/{1.73_m2} (ref >59)
GFR calc non Af Amer: 80 mL/min/{1.73_m2} (ref >59)
Glucose: 91 mg/dL (ref 65–99)
POTASSIUM: 4.2 mmol/L (ref 3.5–5.2)
Sodium: 142 mmol/L (ref 134–144)

## 2017-07-16 LAB — AFP TUMOR MARKER: AFP, Serum, Tumor Marker: 4.1 ng/mL (ref 0.0–8.3)

## 2017-07-21 ENCOUNTER — Ambulatory Visit (HOSPITAL_COMMUNITY)
Admission: RE | Admit: 2017-07-21 | Discharge: 2017-07-21 | Disposition: A | Discharge status: Home / Self Care | Payer: Self-pay | Source: Ambulatory Visit | Attending: Family Medicine | Admitting: Family Medicine

## 2017-07-21 DIAGNOSIS — K7460 Unspecified cirrhosis of liver: Secondary | ICD-10-CM | POA: Insufficient documentation

## 2017-07-21 DIAGNOSIS — N200 Calculus of kidney: Secondary | ICD-10-CM | POA: Insufficient documentation

## 2017-07-21 DIAGNOSIS — K754 Autoimmune hepatitis: Secondary | ICD-10-CM | POA: Insufficient documentation

## 2017-07-23 ENCOUNTER — Other Ambulatory Visit: Payer: Self-pay | Admitting: Family Medicine

## 2017-07-23 DIAGNOSIS — N200 Calculus of kidney: Secondary | ICD-10-CM

## 2017-07-27 ENCOUNTER — Ambulatory Visit: Payer: Self-pay | Attending: Family Medicine | Admitting: Family Medicine

## 2017-07-27 ENCOUNTER — Encounter: Payer: Self-pay | Admitting: Family Medicine

## 2017-07-27 VITALS — BP 117/73 | HR 70 | Temp 98.0°F | Resp 18 | Ht 73.5 in | Wt 229.0 lb

## 2017-07-27 DIAGNOSIS — Z0130 Encounter for examination of blood pressure without abnormal findings: Secondary | ICD-10-CM

## 2017-07-27 DIAGNOSIS — Z79899 Other long term (current) drug therapy: Secondary | ICD-10-CM | POA: Insufficient documentation

## 2017-07-27 DIAGNOSIS — I10 Essential (primary) hypertension: Secondary | ICD-10-CM | POA: Insufficient documentation

## 2017-07-27 NOTE — Patient Instructions (Signed)
DASH Eating Plan DASH stands for "Dietary Approaches to Stop Hypertension." The DASH eating plan is a healthy eating plan that has been shown to reduce high blood pressure (hypertension). It may also reduce your risk for type 2 diabetes, heart disease, and stroke. The DASH eating plan may also help with weight loss. What are tips for following this plan? General guidelines  Avoid eating more than 2,300 mg (milligrams) of salt (sodium) a day. If you have hypertension, you may need to reduce your sodium intake to 1,500 mg a day.  Limit alcohol intake to no more than 1 drink a day for nonpregnant women and 2 drinks a day for men. One drink equals 12 oz of beer, 5 oz of wine, or 1 oz of hard liquor.  Work with your health care provider to maintain a healthy body weight or to lose weight. Ask what an ideal weight is for you.  Get at least 30 minutes of exercise that causes your heart to beat faster (aerobic exercise) most days of the week. Activities may include walking, swimming, or biking.  Work with your health care provider or diet and nutrition specialist (dietitian) to adjust your eating plan to your individual calorie needs. Reading food labels  Check food labels for the amount of sodium per serving. Choose foods with less than 5 percent of the Daily Value of sodium. Generally, foods with less than 300 mg of sodium per serving fit into this eating plan.  To find whole grains, look for the word "whole" as the first word in the ingredient list. Shopping  Buy products labeled as "low-sodium" or "no salt added."  Buy fresh foods. Avoid canned foods and premade or frozen meals. Cooking  Avoid adding salt when cooking. Use salt-free seasonings or herbs instead of table salt or sea salt. Check with your health care provider or pharmacist before using salt substitutes.  Do not fry foods. Cook foods using healthy methods such as baking, boiling, grilling, and broiling instead.  Cook with  heart-healthy oils, such as olive, canola, soybean, or sunflower oil. Meal planning   Eat a balanced diet that includes: ? 5 or more servings of fruits and vegetables each day. At each meal, try to fill half of your plate with fruits and vegetables. ? Up to 6-8 servings of whole grains each day. ? Less than 6 oz of lean meat, poultry, or fish each day. A 3-oz serving of meat is about the same size as a deck of cards. One egg equals 1 oz. ? 2 servings of low-fat dairy each day. ? A serving of nuts, seeds, or beans 5 times each week. ? Heart-healthy fats. Healthy fats called Omega-3 fatty acids are found in foods such as flaxseeds and coldwater fish, like sardines, salmon, and mackerel.  Limit how much you eat of the following: ? Canned or prepackaged foods. ? Food that is high in trans fat, such as fried foods. ? Food that is high in saturated fat, such as fatty meat. ? Sweets, desserts, sugary drinks, and other foods with added sugar. ? Full-fat dairy products.  Do not salt foods before eating.  Try to eat at least 2 vegetarian meals each week.  Eat more home-cooked food and less restaurant, buffet, and fast food.  When eating at a restaurant, ask that your food be prepared with less salt or no salt, if possible. What foods are recommended? The items listed may not be a complete list. Talk with your dietitian about what   dietary choices are best for you. Grains Whole-grain or whole-wheat bread. Whole-grain or whole-wheat pasta. Brown rice. Oatmeal. Quinoa. Bulgur. Whole-grain and low-sodium cereals. Pita bread. Low-fat, low-sodium crackers. Whole-wheat flour tortillas. Vegetables Fresh or frozen vegetables (raw, steamed, roasted, or grilled). Low-sodium or reduced-sodium tomato and vegetable juice. Low-sodium or reduced-sodium tomato sauce and tomato paste. Low-sodium or reduced-sodium canned vegetables. Fruits All fresh, dried, or frozen fruit. Canned fruit in natural juice (without  added sugar). Meat and other protein foods Skinless chicken or turkey. Ground chicken or turkey. Pork with fat trimmed off. Fish and seafood. Egg whites. Dried beans, peas, or lentils. Unsalted nuts, nut butters, and seeds. Unsalted canned beans. Lean cuts of beef with fat trimmed off. Low-sodium, lean deli meat. Dairy Low-fat (1%) or fat-free (skim) milk. Fat-free, low-fat, or reduced-fat cheeses. Nonfat, low-sodium ricotta or cottage cheese. Low-fat or nonfat yogurt. Low-fat, low-sodium cheese. Fats and oils Soft margarine without trans fats. Vegetable oil. Low-fat, reduced-fat, or light mayonnaise and salad dressings (reduced-sodium). Canola, safflower, olive, soybean, and sunflower oils. Avocado. Seasoning and other foods Herbs. Spices. Seasoning mixes without salt. Unsalted popcorn and pretzels. Fat-free sweets. What foods are not recommended? The items listed may not be a complete list. Talk with your dietitian about what dietary choices are best for you. Grains Baked goods made with fat, such as croissants, muffins, or some breads. Dry pasta or rice meal packs. Vegetables Creamed or fried vegetables. Vegetables in a cheese sauce. Regular canned vegetables (not low-sodium or reduced-sodium). Regular canned tomato sauce and paste (not low-sodium or reduced-sodium). Regular tomato and vegetable juice (not low-sodium or reduced-sodium). Pickles. Olives. Fruits Canned fruit in a light or heavy syrup. Fried fruit. Fruit in cream or butter sauce. Meat and other protein foods Fatty cuts of meat. Ribs. Fried meat. Bacon. Sausage. Bologna and other processed lunch meats. Salami. Fatback. Hotdogs. Bratwurst. Salted nuts and seeds. Canned beans with added salt. Canned or smoked fish. Whole eggs or egg yolks. Chicken or turkey with skin. Dairy Whole or 2% milk, cream, and half-and-half. Whole or full-fat cream cheese. Whole-fat or sweetened yogurt. Full-fat cheese. Nondairy creamers. Whipped toppings.  Processed cheese and cheese spreads. Fats and oils Butter. Stick margarine. Lard. Shortening. Ghee. Bacon fat. Tropical oils, such as coconut, palm kernel, or palm oil. Seasoning and other foods Salted popcorn and pretzels. Onion salt, garlic salt, seasoned salt, table salt, and sea salt. Worcestershire sauce. Tartar sauce. Barbecue sauce. Teriyaki sauce. Soy sauce, including reduced-sodium. Steak sauce. Canned and packaged gravies. Fish sauce. Oyster sauce. Cocktail sauce. Horseradish that you find on the shelf. Ketchup. Mustard. Meat flavorings and tenderizers. Bouillon cubes. Hot sauce and Tabasco sauce. Premade or packaged marinades. Premade or packaged taco seasonings. Relishes. Regular salad dressings. Where to find more information:  National Heart, Lung, and Blood Institute: www.nhlbi.nih.gov  American Heart Association: www.heart.org Summary  The DASH eating plan is a healthy eating plan that has been shown to reduce high blood pressure (hypertension). It may also reduce your risk for type 2 diabetes, heart disease, and stroke.  With the DASH eating plan, you should limit salt (sodium) intake to 2,300 mg a day. If you have hypertension, you may need to reduce your sodium intake to 1,500 mg a day.  When on the DASH eating plan, aim to eat more fresh fruits and vegetables, whole grains, lean proteins, low-fat dairy, and heart-healthy fats.  Work with your health care provider or diet and nutrition specialist (dietitian) to adjust your eating plan to your individual   calorie needs. This information is not intended to replace advice given to you by your health care provider. Make sure you discuss any questions you have with your health care provider. Document Released: 06/11/2011 Document Revised: 06/15/2016 Document Reviewed: 06/15/2016 Elsevier Interactive Patient Education  2018 [employer] Inc.  

## 2017-07-27 NOTE — Progress Notes (Signed)
   Subjective:  Patient ID: Timothy [name], male    DOB: 1960/07/24  Age: 57 y.o. MRN: [id number]  CC: Follow-up   HPI Berthold Glace presents for blood pressure check for history of hypertension.   Hypertesion  Disease Monitoring  Blood pressure range: He does not check bp at home  Chest pain: no   Dyspnea: no   Claudication: no   Medication compliance: yes . He reports now taking all his current anti-hypertensives for BP. Medication Side Effects  Lightheadedness: no   Urinary frequency: no   Edema: no   Impotence: no   Preventitive Healthcare:  Exercise: no   Diet Pattern: low sodium  Salt Restriction: no     Outpatient Medications Prior to Visit  Medication Sig Dispense Refill  . hydrochlorothiazide (HYDRODIURIL) 25 MG tablet Take 1 tablet (25 mg total) by mouth daily. 30 tablet 3  . losartan (COZAAR) 25 MG tablet Take 2 tablets (50 mg total) by mouth daily. 30 tablet 3  . nebivolol (BYSTOLIC) 10 MG tablet Take 1 tablet (10 mg total) by mouth daily. 30 tablet 3  . Elastic Bandages & Supports (MEDICAL COMPRESSION STOCKINGS) MISC APPLY STOCKINGS TO BILATERAL LOWER EXTREMITIES FOR SWELLING AND SUPPORT. TO BE FITTED BY MEDICAL SUPPLY. (Patient not taking: Reported on 07/15/2017) 2 each 0   No facility-administered medications prior to visit.     ROS Review of Systems  Constitutional: Negative.   Respiratory: Negative.   Cardiovascular: Negative.   Skin: Negative.    Objective:  BP 117/73 (BP Location: Left Arm, Patient Position: Sitting, Cuff Size: Large)   Pulse 70   Temp 98 F (36.7 C) (Oral)   Resp 18   Ht 6' 1.5" (1.867 m)   Wt 229 lb (103.9 kg)   SpO2 97%   BMI 29.80 kg/m   BP/Weight 07/27/2017 07/15/2017 48/07/8561  Systolic BP 149 702 637  Diastolic BP 73 89 74  Wt. (Lbs) 229 233 230.2  BMI 29.8 30.74 29.56   Physical Exam  Constitutional: He appears well-developed and well-nourished.  Cardiovascular: Normal rate, regular rhythm, normal heart sounds and  intact distal pulses.  Pulmonary/Chest: Effort normal and breath sounds normal.  Skin: Skin is warm and dry.  Nursing note and vitals reviewed.    Assessment & Plan:   1. Essential hypertension -Continue current medications.   2. BP check BP controlled.     Follow-up: Return in about 3 months (around 10/25/2017) for HTN.   Alfonse Spruce FNP

## 2017-08-11 MED FILL — $BYSTOLIC 10 MG TABLET: 10 | 30 days supply | Qty: 30 | Fill #3

## 2017-08-11 MED FILL — LOSARTAN POTASSIUM 25 MG TA: 25 | 30 days supply | Qty: 60 | Fill #1

## 2017-08-30 MED FILL — HYDROCHLOROTHIAZIDE 25 MG T: 25 | 30 days supply | Qty: 30 | Fill #1

## 2017-09-09 ENCOUNTER — Other Ambulatory Visit: Payer: Self-pay | Admitting: Pharmacist

## 2017-09-09 DIAGNOSIS — I10 Essential (primary) hypertension: Secondary | ICD-10-CM

## 2017-09-09 MED ORDER — LOSARTAN POTASSIUM 25 MG PO TABS: 50.0000 mg | ORAL_TABLET | Freq: Every day | ORAL | 0 refills | Status: DC

## 2017-09-09 MED FILL — LOSARTAN POTASSIUM 25 MG TA: 25 | 30 days supply | Qty: 60 | Fill #0

## 2017-09-22 ENCOUNTER — Ambulatory Visit: Payer: Self-pay

## 2017-09-29 ENCOUNTER — Ambulatory Visit: Payer: Self-pay | Attending: Family Medicine | Admitting: Physician Assistant

## 2017-09-29 VITALS — BP 149/93 | HR 70 | Temp 98.0°F | Resp 16 | Wt 233.0 lb

## 2017-09-29 DIAGNOSIS — Z79899 Other long term (current) drug therapy: Secondary | ICD-10-CM | POA: Insufficient documentation

## 2017-09-29 DIAGNOSIS — I10 Essential (primary) hypertension: Secondary | ICD-10-CM | POA: Insufficient documentation

## 2017-09-29 DIAGNOSIS — S8001XA Contusion of right knee, initial encounter: Secondary | ICD-10-CM | POA: Insufficient documentation

## 2017-09-29 DIAGNOSIS — X58XXXA Exposure to other specified factors, initial encounter: Secondary | ICD-10-CM | POA: Insufficient documentation

## 2017-09-29 MED ORDER — LOSARTAN POTASSIUM 25 MG PO TABS: 50.0000 mg | ORAL_TABLET | Freq: Every day | ORAL | 3 refills | Status: DC

## 2017-09-29 MED ORDER — NEBIVOLOL HCL 10 MG PO TABS: 10.0000 mg | ORAL_TABLET | Freq: Every day | ORAL | 3 refills | Status: DC

## 2017-09-29 MED ORDER — HYDROCHLOROTHIAZIDE 25 MG PO TABS: 25.0000 mg | ORAL_TABLET | Freq: Every day | ORAL | 3 refills | Status: DC

## 2017-09-29 MED FILL — $BYSTOLIC 10 MG TABLET: 10 | 30 days supply | Qty: 30 | Fill #0

## 2017-09-29 NOTE — Progress Notes (Signed)
Patient ID: Timothy [name], male   DOB: 1961/07/05, 57 y.o.   MRN: [id number]   Timothy [name], is a 57 y.o. male  GDJ:[id number]  QQI:[id number]  DOB - 07/04/1961  Subjective:  Chief Complaint and HPI: Timothy [name] is a 57 y.o. male here today for BP check and med refills.  He is out of his meds except HCTZ.  No CP/SOB/HA/Dizziness.  Also, fell last week and landed on R knee.  Some soreness and limping a little.  He is able to weight bear.  Fell stepping out of a [employer].  No numbness/weakness.    ROS:   Constitutional:  No f/c, No night sweats, No unexplained weight loss. EENT:  No vision changes, No blurry vision, No hearing changes. No mouth, throat, or ear problems.  Respiratory: No cough, No SOB Cardiac: No CP, no palpitations GI:  No abd pain, No N/V/D. GU: No Urinary s/sx Musculoskeletal: +R knee pain Neuro: No headache, no dizziness, no motor weakness.  Skin: No rash Endocrine:  No polydipsia. No polyuria.  Psych: Denies SI/HI  No problems updated.  ALLERGIES: No Known Allergies  PAST MEDICAL HISTORY: Past Medical History:  Diagnosis Date  . Hypertension     MEDICATIONS AT HOME: Prior to Admission medications   Medication Sig Start Date End Date Taking? Authorizing Provider  Elastic Bandages & Supports (MEDICAL COMPRESSION STOCKINGS) MISC APPLY STOCKINGS TO BILATERAL LOWER EXTREMITIES FOR SWELLING AND SUPPORT. TO BE FITTED BY MEDICAL SUPPLY. Patient not taking: Reported on 07/15/2017 02/22/17   Alfonse Spruce, FNP  hydrochlorothiazide (HYDRODIURIL) 25 MG tablet Take 1 tablet (25 mg total) by mouth daily. 09/29/17   Argentina Donovan, PA-C  losartan (COZAAR) 25 MG tablet Take 2 tablets (50 mg total) by mouth daily. 09/29/17   Argentina Donovan, PA-C  nebivolol (BYSTOLIC) 10 MG tablet Take 1 tablet (10 mg total) by mouth daily. 09/29/17   Argentina Donovan, PA-C     Objective:  EXAM:   Vitals:   09/29/17 1423  BP: (!) 149/93  Pulse: 70  Resp: 16  Temp: 98  F (36.7 C)  TempSrc: Oral  SpO2: 99%  Weight: 233 lb (105.7 kg)    General appearance : A&OX3. NAD. Non-toxic-appearing HEENT: Atraumatic and Normocephalic.  PERRLA. EOM intact.   Neck: supple, no JVD. No cervical lymphadenopathy. No thyromegaly Chest/Lungs:  Breathing-non-labored, Good air entry bilaterally, breath sounds normal without rales, rhonchi, or wheezing  CVS: S1 S2 regular, no murmurs, gallops, rubs  Extremities: Bilateral Lower Ext shows no edema, both legs are warm to touch with = pulse throughout R knee is slightly swollen in the suprapatellar region w/o ballotment.  There is a large bruise.  Full S&ROM.  Ligaments are stable.   Neurology:  CN II-XII grossly intact, Non focal.   Psych:  TP linear. J/I WNL. Normal speech. Appropriate eye contact and affect.  Skin:  No Rash  Data Review Lab Results  Component Value Date   HGBA1C 6.0 (H) 11/25/2016   HGBA1C 5.40 03/29/2015   HGBA1C 5.6 11/15/2012     Assessment & Plan   1. Essential hypertension Not controlled but out of meds.  Resume meds.  Check BP OOO. We have discussed target BP range and blood pressure goal. I have advised patient to check BP regularly and to call us back or report to clinic if the numbers are consistently higher than 140/90. We discussed the importance of compliance with medical therapy and DASH diet recommended, consequences of uncontrolled hypertension  discussed.  - losartan (COZAAR) 25 MG tablet; Take 2 tablets (50 mg total) by mouth daily.  Dispense: 180 tablet; Refill: 3 - hydrochlorothiazide (HYDRODIURIL) 25 MG tablet; Take 1 tablet (25 mg total) by mouth daily.  Dispense: 90 tablet; Refill: 3 - nebivolol (BYSTOLIC) 10 MG tablet; Take 1 tablet (10 mg total) by mouth daily.  Dispense: 90 tablet; Refill: 3  2. Contusion of right knee, initial encounter Ace wraps, RICE therapy, ibuprofen as needed     Patient have been counseled extensively about nutrition and exercise  Return for as  scheuled in April or 3 months from now.  The patient was given clear instructions to go to ER or return to medical center if symptoms don't improve, worsen or new problems develop. The patient verbalized understanding. The patient was told to call to get lab results if they haven't heard anything in the next week.     Freeman Caldron, PA-C [facility] and [facility], [place]   09/29/2017, 2:38 PM

## 2017-09-29 NOTE — Patient Instructions (Signed)
RICE for Routine Care of Injuries Many injuries can be cared for using rest, ice, compression, and elevation (RICE therapy). Using RICE therapy can help to lessen pain and swelling. It can help your body to heal. Rest Reduce your normal activities and avoid using the injured part of your body. You can go back to your normal activities when you feel okay and your doctor says it is okay. Ice Do not put ice on your bare skin.  Put ice in a plastic bag.  Place a towel between your skin and the bag.  Leave the ice on for 20 minutes, 2-3 times a day.  Do this for as long as told by your doctor. Compression Compression means putting pressure on the injured area. This can be done with an elastic bandage. If an elastic bandage has been applied:  Remove and reapply the bandage every 3-4 hours or as told by your doctor.  Make sure the bandage is not wrapped too tight. Wrap the bandage more loosely if part of your body beyond the bandage is blue, swollen, cold, painful, or loses feeling (numb).  See your doctor if the bandage seems to make your problems worse.  Elevation Elevation means keeping the injured area raised. Raise the injured area above your heart or the center of your chest if you can. When should I get help? You should get help if:  You keep having pain and swelling.  Your symptoms get worse.  Get help right away if: You should get help right away if:  You have sudden bad pain at or below the area of your injury.  You have redness or more swelling around your injury.  You have tingling or numbness at or below the injury that does not go away when you take off the bandage.  This information is not intended to replace advice given to you by your health care provider. Make sure you discuss any questions you have with your health care provider. Document Released: 12/09/2007 Document Revised: 05/19/2016 Document Reviewed: 05/30/2014 Elsevier Interactive Patient Education  2017  Elsevier Inc[employer]  

## 2017-10-13 MED FILL — LOSARTAN POTASSIUM 25 MG TA: 25 | 30 days supply | Qty: 60 | Fill #0

## 2017-10-25 ENCOUNTER — Encounter: Payer: Self-pay | Admitting: Nurse Practitioner

## 2017-10-25 ENCOUNTER — Ambulatory Visit: Payer: Self-pay

## 2017-10-25 ENCOUNTER — Ambulatory Visit: Payer: Self-pay | Admitting: Family Medicine

## 2017-10-25 ENCOUNTER — Ambulatory Visit: Payer: Self-pay | Attending: Family Medicine | Admitting: Nurse Practitioner

## 2017-10-25 VITALS — BP 163/94 | HR 59 | Temp 98.3°F | Ht 72.0 in | Wt 230.0 lb

## 2017-10-25 DIAGNOSIS — Z79899 Other long term (current) drug therapy: Secondary | ICD-10-CM | POA: Insufficient documentation

## 2017-10-25 DIAGNOSIS — Z0189 Encounter for other specified special examinations: Secondary | ICD-10-CM | POA: Insufficient documentation

## 2017-10-25 DIAGNOSIS — I10 Essential (primary) hypertension: Secondary | ICD-10-CM | POA: Insufficient documentation

## 2017-10-25 DIAGNOSIS — K754 Autoimmune hepatitis: Secondary | ICD-10-CM | POA: Insufficient documentation

## 2017-10-25 DIAGNOSIS — M7989 Other specified soft tissue disorders: Secondary | ICD-10-CM | POA: Insufficient documentation

## 2017-10-25 DIAGNOSIS — D696 Thrombocytopenia, unspecified: Secondary | ICD-10-CM | POA: Insufficient documentation

## 2017-10-25 DIAGNOSIS — K7460 Unspecified cirrhosis of liver: Secondary | ICD-10-CM | POA: Insufficient documentation

## 2017-10-25 MED ORDER — NEBIVOLOL HCL 10 MG PO TABS: 10.0000 mg | ORAL_TABLET | Freq: Every day | ORAL | 3 refills | Status: DC

## 2017-10-25 MED ORDER — LOSARTAN POTASSIUM 50 MG PO TABS
50.0000 mg | ORAL_TABLET | Freq: Every day | ORAL | 0 refills | Status: DC
Start: 2017-10-25 — End: 2017-11-22

## 2017-10-25 MED ORDER — HYDROCHLOROTHIAZIDE 25 MG PO TABS: 25.0000 mg | ORAL_TABLET | Freq: Every day | ORAL | 3 refills | Status: DC

## 2017-10-25 MED FILL — $BYSTOLIC 10 MG TABLET: 10 | 30 days supply | Qty: 30 | Fill #1

## 2017-10-25 MED FILL — HYDROCHLOROTHIAZIDE 25 MG T: 25 | 30 days supply | Qty: 30 | Fill #0

## 2017-10-25 MED FILL — LOSARTAN POTASSIUM 50 MG TA: 50 | 30 days supply | Qty: 30 | Fill #0

## 2017-10-25 NOTE — Patient Instructions (Signed)
DASH Eating Plan DASH stands for "Dietary Approaches to Stop Hypertension." The DASH eating plan is a healthy eating plan that has been shown to reduce high blood pressure (hypertension). It may also reduce your risk for type 2 diabetes, heart disease, and stroke. The DASH eating plan may also help with weight loss. What are tips for following this plan? General guidelines  Avoid eating more than 2,300 mg (milligrams) of salt (sodium) a day. If you have hypertension, you may need to reduce your sodium intake to 1,500 mg a day.  Limit alcohol intake to no more than 1 drink a day for nonpregnant women and 2 drinks a day for men. One drink equals 12 oz of beer, 5 oz of wine, or 1 oz of hard liquor.  Work with your health care provider to maintain a healthy body weight or to lose weight. Ask what an ideal weight is for you.  Get at least 30 minutes of exercise that causes your heart to beat faster (aerobic exercise) most days of the week. Activities may include walking, swimming, or biking.  Work with your health care provider or diet and nutrition specialist (dietitian) to adjust your eating plan to your individual calorie needs. Reading food labels  Check food labels for the amount of sodium per serving. Choose foods with less than 5 percent of the Daily Value of sodium. Generally, foods with less than 300 mg of sodium per serving fit into this eating plan.  To find whole grains, look for the word "whole" as the first word in the ingredient list. Shopping  Buy products labeled as "low-sodium" or "no salt added."  Buy fresh foods. Avoid canned foods and premade or frozen meals. Cooking  Avoid adding salt when cooking. Use salt-free seasonings or herbs instead of table salt or sea salt. Check with your health care provider or pharmacist before using salt substitutes.  Do not fry foods. Cook foods using healthy methods such as baking, boiling, grilling, and broiling instead.  Cook with  heart-healthy oils, such as olive, canola, soybean, or sunflower oil. Meal planning   Eat a balanced diet that includes: ? 5 or more servings of fruits and vegetables each day. At each meal, try to fill half of your plate with fruits and vegetables. ? Up to 6-8 servings of whole grains each day. ? Less than 6 oz of lean meat, poultry, or fish each day. A 3-oz serving of meat is about the same size as a deck of cards. One egg equals 1 oz. ? 2 servings of low-fat dairy each day. ? A serving of nuts, seeds, or beans 5 times each week. ? Heart-healthy fats. Healthy fats called Omega-3 fatty acids are found in foods such as flaxseeds and coldwater fish, like sardines, salmon, and mackerel.  Limit how much you eat of the following: ? Canned or prepackaged foods. ? Food that is high in trans fat, such as fried foods. ? Food that is high in saturated fat, such as fatty meat. ? Sweets, desserts, sugary drinks, and other foods with added sugar. ? Full-fat dairy products.  Do not salt foods before eating.  Try to eat at least 2 vegetarian meals each week.  Eat more home-cooked food and less restaurant, buffet, and fast food.  When eating at a restaurant, ask that your food be prepared with less salt or no salt, if possible. What foods are recommended? The items listed may not be a complete list. Talk with your dietitian about what   dietary choices are best for you. Grains Whole-grain or whole-wheat bread. Whole-grain or whole-wheat pasta. Brown rice. Oatmeal. Quinoa. Bulgur. Whole-grain and low-sodium cereals. Pita bread. Low-fat, low-sodium crackers. Whole-wheat flour tortillas. Vegetables Fresh or frozen vegetables (raw, steamed, roasted, or grilled). Low-sodium or reduced-sodium tomato and vegetable juice. Low-sodium or reduced-sodium tomato sauce and tomato paste. Low-sodium or reduced-sodium canned vegetables. Fruits All fresh, dried, or frozen fruit. Canned fruit in natural juice (without  added sugar). Meat and other protein foods Skinless chicken or turkey. Ground chicken or turkey. Pork with fat trimmed off. Fish and seafood. Egg whites. Dried beans, peas, or lentils. Unsalted nuts, nut butters, and seeds. Unsalted canned beans. Lean cuts of beef with fat trimmed off. Low-sodium, lean deli meat. Dairy Low-fat (1%) or fat-free (skim) milk. Fat-free, low-fat, or reduced-fat cheeses. Nonfat, low-sodium ricotta or cottage cheese. Low-fat or nonfat yogurt. Low-fat, low-sodium cheese. Fats and oils Soft margarine without trans fats. Vegetable oil. Low-fat, reduced-fat, or light mayonnaise and salad dressings (reduced-sodium). Canola, safflower, olive, soybean, and sunflower oils. Avocado. Seasoning and other foods Herbs. Spices. Seasoning mixes without salt. Unsalted popcorn and pretzels. Fat-free sweets. What foods are not recommended? The items listed may not be a complete list. Talk with your dietitian about what dietary choices are best for you. Grains Baked goods made with fat, such as croissants, muffins, or some breads. Dry pasta or rice meal packs. Vegetables Creamed or fried vegetables. Vegetables in a cheese sauce. Regular canned vegetables (not low-sodium or reduced-sodium). Regular canned tomato sauce and paste (not low-sodium or reduced-sodium). Regular tomato and vegetable juice (not low-sodium or reduced-sodium). Pickles. Olives. Fruits Canned fruit in a light or heavy syrup. Fried fruit. Fruit in cream or butter sauce. Meat and other protein foods Fatty cuts of meat. Ribs. Fried meat. Bacon. Sausage. Bologna and other processed lunch meats. Salami. Fatback. Hotdogs. Bratwurst. Salted nuts and seeds. Canned beans with added salt. Canned or smoked fish. Whole eggs or egg yolks. Chicken or turkey with skin. Dairy Whole or 2% milk, cream, and half-and-half. Whole or full-fat cream cheese. Whole-fat or sweetened yogurt. Full-fat cheese. Nondairy creamers. Whipped toppings.  Processed cheese and cheese spreads. Fats and oils Butter. Stick margarine. Lard. Shortening. Ghee. Bacon fat. Tropical oils, such as coconut, palm kernel, or palm oil. Seasoning and other foods Salted popcorn and pretzels. Onion salt, garlic salt, seasoned salt, table salt, and sea salt. Worcestershire sauce. Tartar sauce. Barbecue sauce. Teriyaki sauce. Soy sauce, including reduced-sodium. Steak sauce. Canned and packaged gravies. Fish sauce. Oyster sauce. Cocktail sauce. Horseradish that you find on the shelf. Ketchup. Mustard. Meat flavorings and tenderizers. Bouillon cubes. Hot sauce and Tabasco sauce. Premade or packaged marinades. Premade or packaged taco seasonings. Relishes. Regular salad dressings. Where to find more information:  National Heart, Lung, and Blood Institute: www.nhlbi.nih.gov  American Heart Association: www.heart.org Summary  The DASH eating plan is a healthy eating plan that has been shown to reduce high blood pressure (hypertension). It may also reduce your risk for type 2 diabetes, heart disease, and stroke.  With the DASH eating plan, you should limit salt (sodium) intake to 2,300 mg a day. If you have hypertension, you may need to reduce your sodium intake to 1,500 mg a day.  When on the DASH eating plan, aim to eat more fresh fruits and vegetables, whole grains, lean proteins, low-fat dairy, and heart-healthy fats.  Work with your health care provider or diet and nutrition specialist (dietitian) to adjust your eating plan to your individual   calorie needs. This information is not intended to replace advice given to you by your health care provider. Make sure you discuss any questions you have with your health care provider. Document Released: 06/11/2011 Document Revised: 06/15/2016 Document Reviewed: 06/15/2016 Elsevier Interactive Patient Education  2018 [employer] Inc.  

## 2017-10-25 NOTE — Progress Notes (Signed)
Assessment & Plan:  Timothy [name] was seen today for establish care and medication problem.  Diagnoses and all orders for this visit:  Essential hypertension -     losartan (COZAAR) 50 MG tablet; Take 1 tablet (50 mg total) by mouth daily. -     hydrochlorothiazide (HYDRODIURIL) 25 MG tablet; Take 1 tablet (25 mg total) by mouth daily. -     nebivolol (BYSTOLIC) 10 MG tablet; Take 1 tablet (10 mg total) by mouth daily.  Cirrhosis of liver without ascites, unspecified hepatic cirrhosis type (HCC) -     CMP14+EGFR  Thrombocytopenia (HCC) -     CBC    Patient has been counseled on age-appropriate routine health concerns for screening and prevention. These are reviewed and up-to-date. Referrals have been placed accordingly. Immunizations are up-to-date or declined.    Subjective:   Chief Complaint  Patient presents with  . Establish Care    Pt. is here to establish care for hypertension.   . Medication Problem    Pt. stated the losartan makes his feet swell, pt. stated he take it every now and then.    HPI Timothy [name] 57 y.o. male presents to office today to establish care. He has a history of essential hypertension, autoimmune hepatitis and liver cirrhosis.   Essential Hypertension Chronic. Blood pressure is poorly controlled. He does not check his blood pressure at home. Denies chest pain, shortness of breath, palpitations, lightheadedness, dizziness, headaches. Endorses BLE edema. However he is noncompliant with diet; needs to decrease sodium intake. Reports he has not been taking losartan as prescribed. He felt the losartan was causing his BLE edema. I instructed him that he needs to increase his water intake, decrease sodium and wear compression socks as instructed. He was also instructed to take losartan as prescribed.  BP Readings from Last 3 Encounters:  10/25/17 (!) 163/94  09/29/17 (!) 149/93  07/27/17 117/73   Cirrhosis of Liver/Autoimmune hepatitis Per record review he was  treated with azathioprine several years ago and was followed by [facility]. He will need to follow up with gastroenterology. He denies any abdominal pain, jaundice,  nausea or vomiting. Lab Results  Component Value Date   HAV NEG 01/11/2008   HEPBIGM NEG 01/11/2008   HEPBCAB NEG 01/11/2008   HBEAG Negative 01/05/2008    Thrombocytopenia Chronic. Ongoing. He is being followed by Oncology. Next Abd [place] due 01-2018.  05-13-2017 Per Oncology notes: If his platelets continue to trend downwards <30K with no bleeding or <50K with concerns for bleeding- I would recommend Nplate injections or consideration of promacta Next follow up: 6 months Lab Results  Component Value Date   PLT 63 (LL) 07/15/2017    Review of Systems  Constitutional: Negative for fever, malaise/fatigue and weight loss.  HENT: Negative.  Negative for nosebleeds.   Eyes: Negative.  Negative for blurred vision, double vision and photophobia.  Respiratory: Negative.  Negative for cough and shortness of breath.   Cardiovascular: Positive for leg swelling. Negative for chest pain and palpitations.  Gastrointestinal: Negative.  Negative for heartburn, nausea and vomiting.  Musculoskeletal: Negative.  Negative for myalgias.  Neurological: Negative.  Negative for dizziness, focal weakness, seizures and headaches.  Psychiatric/Behavioral: Negative.  Negative for suicidal ideas.    Past Medical History:  Diagnosis Date  . Autoimmune hepatitis ([place])   . Cirrhosis of liver ([place])   . Hypertension   . Thrombocytopenic ([place])     History reviewed. No pertinent surgical history.  Family History  Problem Relation Age of Onset  . Hypertension Sister     Social History Reviewed with no changes to be made today.   Outpatient Medications Prior to Visit  Medication Sig Dispense Refill  . hydrochlorothiazide (HYDRODIURIL) 25 MG tablet Take 1 tablet (25 mg total) by mouth daily. 90 tablet 3  . losartan (COZAAR) 25 MG tablet Take 2  tablets (50 mg total) by mouth daily. 180 tablet 3  . nebivolol (BYSTOLIC) 10 MG tablet Take 1 tablet (10 mg total) by mouth daily. 90 tablet 3  . Elastic Bandages & Supports (MEDICAL COMPRESSION STOCKINGS) MISC APPLY STOCKINGS TO BILATERAL LOWER EXTREMITIES FOR SWELLING AND SUPPORT. TO BE FITTED BY MEDICAL SUPPLY. (Patient not taking: Reported on 07/15/2017) 2 each 0   No facility-administered medications prior to visit.     No Known Allergies     Objective:    BP (!) 163/94 (BP Location: Left Arm, Patient Position: Sitting, Cuff Size: Normal)   Pulse (!) 59   Temp 98.3 F (36.8 C) (Oral)   Ht 6' (1.829 m)   Wt 230 lb (104.3 kg)   SpO2 96%   BMI 31.19 kg/m  Wt Readings from Last 3 Encounters:  10/25/17 230 lb (104.3 kg)  09/29/17 233 lb (105.7 kg)  07/27/17 229 lb (103.9 kg)    Physical Exam  Constitutional: He is oriented to person, place, and time. He appears well-developed and well-nourished. He is cooperative.  HENT:  Head: Normocephalic and atraumatic.  Eyes: EOM are normal.  Neck: Normal range of motion.  Cardiovascular: Normal rate, regular rhythm and normal heart sounds. Exam reveals no gallop and no friction rub.  No murmur heard. Pulmonary/Chest: Effort normal and breath sounds normal. No tachypnea. No respiratory distress. He has no decreased breath sounds. He has no wheezes. He has no rhonchi. He has no rales. He exhibits no tenderness.  Abdominal: Soft. Bowel sounds are normal.  Musculoskeletal: Normal range of motion. He exhibits edema (BLE nonpitting). He exhibits no tenderness or deformity.  Neurological: He is alert and oriented to person, place, and time. Coordination normal.  Skin: Skin is warm and dry.  Psychiatric: He has a normal mood and affect. His behavior is normal. Judgment and thought content normal.  Nursing note and vitals reviewed.      Patient has been counseled extensively about nutrition and exercise as well as the importance of adherence  with medications and regular follow-up. The patient was given clear instructions to go to ER or return to medical center if symptoms don't improve, worsen or new problems develop. The patient verbalized understanding.   Follow-up: Return in about 1 month (around 11/22/2017) for BP recheck.   Gildardo Pounds, FNP-BC [facility] and [facility] [place], [place]   10/25/2017, 8:23 PM

## 2017-10-26 LAB — CMP14+EGFR
ALBUMIN: 3.8 g/dL (ref 3.5–5.5)
ALK PHOS: 94 IU/L (ref 39–117)
ALT: 34 IU/L (ref 0–44)
AST: 54 IU/L — ABNORMAL HIGH (ref 0–40)
Albumin/Globulin Ratio: 1 — ABNORMAL LOW (ref 1.2–2.2)
BILIRUBIN TOTAL: 0.7 mg/dL (ref 0.0–1.2)
BUN/Creatinine Ratio: 14 (ref 9–20)
BUN: 16 mg/dL (ref 6–24)
CHLORIDE: 108 mmol/L — AB (ref 96–106)
CO2: 23 mmol/L (ref 20–29)
CREATININE: 1.14 mg/dL (ref 0.76–1.27)
Calcium: 9.3 mg/dL (ref 8.7–10.2)
GFR calc non Af Amer: 71 mL/min/{1.73_m2} (ref >59)
GFR, EST AFRICAN AMERICAN: 82 mL/min/{1.73_m2} (ref >59)
GLOBULIN, TOTAL: 3.7 g/dL (ref 1.5–4.5)
Glucose: 63 mg/dL — ABNORMAL LOW (ref 65–99)
Potassium: 4.6 mmol/L (ref 3.5–5.2)
SODIUM: 142 mmol/L (ref 134–144)
TOTAL PROTEIN: 7.5 g/dL (ref 6.0–8.5)

## 2017-10-26 LAB — CBC
HEMATOCRIT: 43.1 % (ref 37.5–51.0)
HEMOGLOBIN: 13.9 g/dL (ref 13.0–17.7)
MCH: 27.9 pg (ref 26.6–33.0)
MCHC: 32.3 g/dL (ref 31.5–35.7)
MCV: 87 fL (ref 79–97)
Platelets: 61 10*3/uL — CL (ref 150–379)
RBC: 4.98 x10E6/uL (ref 4.14–5.80)
RDW: 15.7 % — AB (ref 12.3–15.4)
WBC: 5.1 10*3/uL (ref 3.4–10.8)

## 2017-10-28 ENCOUNTER — Telehealth: Payer: Self-pay

## 2017-10-28 NOTE — Telephone Encounter (Signed)
-----   Message from Gildardo Pounds, NP sent at 10/27/2017  8:35 AM EDT ----- Platelets have dropped from 6 to 63 to your specialist.  We will continue to monitor and would like for you to have labs redrawn in 3 months

## 2017-10-28 NOTE — Telephone Encounter (Signed)
CMA attempt to call patient to inform on lab results.  No answer and left a VM for patient to call back.  If patient call back, please inform:  Platelets have dropped from 61 to 63 to your specialist.  We will continue to monitor and would like for you to have labs redrawn in 3 months

## 2017-11-08 NOTE — Progress Notes (Signed)
Marland Kitchen    HEMATOLOGY/ONCOLOGY CLINIC NOTE  Date of Service: 11/10/2017  Patient Care Team: Gildardo Pounds, NP as PCP - General (Nurse Practitioner)  CHIEF COMPLAINTS/PURPOSE OF CONSULTATION:  F/u for Thrombocytopenia  HISTORY OF PRESENTING ILLNESS:  Timothy [name] is a wonderful 57 y.o. male who has been referred to [place] by Dr Feliciana Rossetti for evaluation and management of thrombocytopenia.  Patient has a history of hypertension, autoimmune hepatitis, liver cirrhosis. He notes that he was treated at [facility] for his ? Autoimmune hepatitis in 4403 but is uncertain about the exact treatment (appears he was treated with Azathioprine and was on diuretics). He notes that he hasn't had any recent follow-up her specialist or had any recent abdominal imaging since 2009.  He was referred to [place] for evaluation of thrombocytopenia after his recent labs with Dr. Feliciana Rossetti on 09/27/2015 showed a platelet count of 70k. In reviewing his previous labs in our system it appears that he has had thrombocytopenia in the range of 70-80k platelets since June 2009. He reports no bleeding. No epistaxis. No blood in the stools. No hematuria. No hemoptysis. Reports no new medications. No recent viral respiratory or gastrointestinal infections.  No other acute new symptoms. Notes that he is feeling well.   INTERVAL HISTORY  Timothy [name] is here with his wife for follow-up of his thrombocytopenia. The patient's last visit with [place] was on 05/13/17. The pt reports that he is doing well overall.   The pt reports that he has no new concerns and has been enjoying life. He continues taking water pills every day and has been trying to limit his salt intake.   Of note since the patient's last visit, pt has had US Abdomen  completed on 07/21/17 with results revealing 1. No acute abnormality. 2. Lobulated liver with coarse echotexture consistent with cirrhosis, unchanged. 3. Bilateral renal stones, without obstruction. Renal stones have been noted on prior  ultrasound exams.  Lab results today (10/25/17) of CBC, CMP, and Reticulocytes is as follows: all values are WNL except for RDW at 15.7 and Platelets at 61k, Glucose at 63, Chloride at 108, Albumin at 1.0, AST at 54.  On review of systems, pt reports good energy levels, and denies nose bleeds, gum bleeds, abnormal bruising, neck pain, blood in the stools, abdominal pains, changes in bowel habits, leg swelling, and any other symptoms.   MEDICAL HISTORY:  Past Medical History:  Diagnosis Date  . Autoimmune hepatitis ([place])   . Cirrhosis of liver ([place])   . Hypertension   . Thrombocytopenic ([place])   Liver cirrhosis  ?Autoimmune hepatitis treated with azathioprine in 2009. Was following with Dr. Shearon Stalls at [facility] gastroenterology.  SURGICAL HISTORY: No past surgical history on file.  SOCIAL HISTORY: Social History   Socioeconomic History  . Marital status: Single    Spouse name: Not on file  . Number of children: Not on file  . Years of education: Not on file  . Highest education level: Not on file  Occupational History  . Not on file  Social Needs  . Financial resource strain: Not on file  . Food insecurity:    Worry: Not on file    Inability: Not on file  . Transportation needs:    Medical: Not on file    Non-medical: Not on file  Tobacco Use  . Smoking status: Never Smoker  . Smokeless tobacco: Never Used  Substance and Sexual Activity  . Alcohol use: No    Alcohol/week: 0.0 oz  .  Drug use: No  . Sexual activity: Yes  Lifestyle  . Physical activity:    Days per week: Not on file    Minutes per session: Not on file  . Stress: Not on file  Relationships  . Social connections:    Talks on phone: Not on file    Gets together: Not on file    Attends religious service: Not on file    Active member of club or organization: Not on file    Attends meetings of clubs or organizations: Not on file    Relationship status: Not on file  . Intimate partner violence:    Fear of  current or ex partner: Not on file    Emotionally abused: Not on file    Physically abused: Not on file    Forced sexual activity: Not on file  Other Topics Concern  . Not on file  Social History Narrative  . Not on file   Ex smoker quit 10 yrs ago, previously 1 PPD x 30-35 yrs. Cocaine ex 19 yrs Construction type work painting/remodelling   FAMILY HISTORY: Family History  Problem Relation Age of Onset  . Hypertension Sister     Denies any family history of blood disorders or cancers.  ALLERGIES:  has No Known Allergies.  MEDICATIONS:  Current Outpatient Medications  Medication Sig Dispense Refill  . hydrochlorothiazide (HYDRODIURIL) 25 MG tablet Take 1 tablet (25 mg total) by mouth daily. 90 tablet 3  . losartan (COZAAR) 50 MG tablet Take 1 tablet (50 mg total) by mouth daily. 90 tablet 0  . nebivolol (BYSTOLIC) 10 MG tablet Take 1 tablet (10 mg total) by mouth daily. 90 tablet 3   No current facility-administered medications for this visit.     REVIEW OF SYSTEMS:   A 10+ POINT REVIEW OF SYSTEMS WAS OBTAINED including neurology, dermatology, psychiatry, cardiac, respiratory, lymph, extremities, GI, GU, Musculoskeletal, constitutional, breasts, reproductive, HEENT.  All pertinent positives are noted in the HPI.  All others are negative.   PHYSICAL EXAMINATION: ECOG PERFORMANCE STATUS: 1 - Symptomatic but completely ambulatory  . Vitals:   11/10/17 1505  BP: 122/82  Pulse: 67  Resp: 16  Temp: 97.9 F (36.6 C)  SpO2: 99%   Filed Weights   11/10/17 1505  Weight: 223 lb (101.2 kg)   .Body mass index is 30.24 kg/m.  GENERAL:alert, in no acute distress and comfortable SKIN: no acute rashes, no significant lesions EYES: conjunctiva are pink and non-injected, sclera anicteric OROPHARYNX: MMM, no exudates, no oropharyngeal erythema or ulceration NECK: supple, no JVD LYMPH:  no palpable lymphadenopathy in the cervical, axillary or inguinal regions LUNGS: clear to  auscultation b/l with normal respiratory effort HEART: regular rate & rhythm ABDOMEN:  normoactive bowel sounds , non tender, no overtly palpable hepatomegaly, borderline palpable splenomegaly Extremity: no pedal edema PSYCH: alert & oriented x 3 with fluent speech NEURO: no focal motor/sensory deficits   LABORATORY DATA:  I have reviewed the data as listed  . CBC Latest Ref Rng & Units 10/25/2017 07/15/2017 04/14/2017  WBC 3.4 - 10.8 x10E3/uL 5.1 4.3 3.5  Hemoglobin 13.0 - 17.7 g/dL 13.9 14.0 13.7  Hematocrit 37.5 - 51.0 % 43.1 42.1 39.8  Platelets 150 - 379 x10E3/uL 61(LL) 63(LL) 65(LL)    . CMP Latest Ref Rng & Units 10/25/2017 07/15/2017 04/20/2017  Glucose 65 - 99 mg/dL 63(L) 91 126(H)  BUN 6 - 24 mg/dL 16 15 18   Creatinine 0.76 - 1.27 mg/dL 1.14 1.03 1.25  Sodium 134 - 144 mmol/L 142 142 141  Potassium 3.5 - 5.2 mmol/L 4.6 4.2 4.0  Chloride 96 - 106 mmol/L 108(H) 107(H) 106  CO2 20 - 29 mmol/L 23 24 23   Calcium 8.7 - 10.2 mg/dL 9.3 9.3 9.5  Total Protein 6.0 - 8.5 g/dL 7.5 - 7.5  Total Bilirubin 0.0 - 1.2 mg/dL 0.7 - 0.7  Alkaline Phos 39 - 117 IU/L 94 - 67  AST 0 - 40 IU/L 54(H) - 46(H)  ALT 0 - 44 IU/L 34 - 27     Component     Latest Ref Rng 10/24/2015  HCV Quant Baseline      HCV Not Detected  IU LOG10      Test Not Performed.  Test Information      Comment  Hcv Genotype      Test Not Performed.  Hep C Virus Ab     0.0 - 0.9 s/co ratio 0.2    Diagnosis  A. "LIVER BIOPSY" (TRANSJUGULAR BIOPSY): 02/27/2008       FEATURES OF MILD INTERFACE AND LOBULAR HEPATITIS. SEE COMMENT AND       MICROSCOPIC DESCRIPTION.     EVIDENCE OF MILD BILE DUCT INJURY.     MILD TO MODERATE CANALICULAR AND HEPATOCELLULAR CHOLESTASIS.     CIRRHOSIS.  COMMENT: From a morphologic perspective the underlying reason the patient's cirrhosis is not definitively evident. There appears to be a biliary pattern of injury with cholestasis as well as interface and lobular hepatitis. The  differential diagnosis includes, but is not limited to, autoimmune hepatitis with an overlap autoimmune cholangiopathy, the effects of medications/drugs/herbal remedies, infectious etiologies, primary and secondary biliary processes such as primary sclerosing cholangitis and secondary sclerosing cholangitis. Clinical and laboratory correlation are required. Comment:  I certify that I personally conducted the diagnostic evaluation of the above  specimen(s) and have rendered the above diagnosis(es).                             Docia Chuck, M.D.  RADIOGRAPHIC STUDIES: I have personally reviewed the radiological images as listed and agreed with the findings in the report.  Ultrasound abdomen 07/21/2017 IMPRESSION: 1. No acute abnormality. 2. Lobulated liver with coarse echotexture consistent with cirrhosis, unchanged. 3. Bilateral renal stones, without obstruction. Renal stones have been noted on prior ultrasound exams.   Electronically Signed   By: Lorriane Shire M.D.   ASSESSMENT & PLAN:   57 year old African-American gentleman with  #1 Chronic thrombocytopenia. Platelet counts for about 60-80K since June 2009. Recently his counts had improved to 79K but have fallen again last month to 65K.  Patient notes no issues with easy bruisability or overt bleeding. Has a history of  liver cirrhosis. 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 Ultrasound abdomen did not show any overt splenomegaly. Cannot rule out ITP No evidence of pseudothrombocytopenia on his smear.  #2 history of ? autoimmune hepatitis- was being treated by Dr. Shearon Stalls at [facility] gastroenterology. Was previously on as of the upper and in 2009. Appears to have been lost to follow-up. Labs today show he is hepatitis C negative Alpha-fetoprotein was previously elevated to 20.5 on 02/24/2008 in the back down to 5.3 on 10/24/2015. Previous labs in 2009 showed he  was hepatitis B negative, hepatitis C negative Anti-smooth muscle antibody screen negative Antimitochondrial antibody negative Patient's liver function tests are much better than in 2009  with only minor elevation in AST levels.  #3 history of liver cirrhosis - patient notes that he has not had a liver follow-up for his liver cirrhosis.   Plan -Avoid NSAIDs or other medications that might affect platelet function or numbers. -I encouraged him to avoid alcohol.  -Continue every 6 monthly ultrasound abdomen and AFP tumor marker for hepatocellular carcinoma screening with primary care physician. -Repeat CBC with primary care physician in 3 months -he will likely need continued follow-up with hepatology-we would recommend coordinating this through his primary care physician. -If his platelets continue to trend downwards <30K with no bleeding or <50K with concerns for bleeding- I would recommend Nplate injections  -No indication for treatment of his thrombocytopenia at this time.  -Discussed pt labwork from 10/25/17; platelets are stable at 61k though likely measured with some clumping.  -Discussed 07/21/17 US Abdomen which was stable, and his liver was unchanged with cirrhosis.  -Avoid NSAIDs, okay to take Tylenol every once in a while  -Continue 6 month follow up with PCP Lamar Laundry, NP and 6 month US Abdomen  -Continue limiting salt intake to 2-3g per day. -Will see pt back in 6 months    RTC with Dr Irene Limbo with labs in 6 months   Orders Placed This Encounter  Procedures  . CBC & Diff and Retic    Standing Status:   Future    Standing Expiration Date:   11/11/2018  . CBC with Differential/Platelet    Standing Status:   Future    Standing Expiration Date:   12/15/2018  . Reticulocytes    Standing Status:   Future    Standing Expiration Date:   11/11/2018   All of the patients questions were answered with apparent satisfaction. The patient knows to call the clinic with any problems,  questions or concerns.  . The total time spent in the appointment was 15 minutes and more than 50% was on counseling and direct patient cares.      Sullivan Lone MD [place] AAHIVMS [facility] Hematology/Oncology Physician [place]  (Office):       [phone] (Work cell):  [phone] (Fax):           [phone]  11/10/2017 3:25 PM  This document serves as a record of services personally performed by Sullivan Lone, MD. It was created on his behalf by Baldwin Jamaica, a trained medical scribe. The creation of this record is based on the scribe's personal observations and the provider's statements to them.   I have reviewed the above documentation for accuracy and completeness, and I agree with the above. Brunetta Genera MD

## 2017-11-10 ENCOUNTER — Inpatient Hospital Stay: Payer: Self-pay | Attending: Hematology | Admitting: Hematology

## 2017-11-10 ENCOUNTER — Inpatient Hospital Stay: Payer: Self-pay

## 2017-11-10 ENCOUNTER — Encounter: Payer: Self-pay | Admitting: Hematology

## 2017-11-10 VITALS — BP 122/82 | HR 67 | Temp 97.9°F | Resp 16 | Wt 223.0 lb

## 2017-11-10 DIAGNOSIS — D696 Thrombocytopenia, unspecified: Secondary | ICD-10-CM | POA: Insufficient documentation

## 2017-11-10 DIAGNOSIS — K754 Autoimmune hepatitis: Secondary | ICD-10-CM

## 2017-11-10 DIAGNOSIS — I10 Essential (primary) hypertension: Secondary | ICD-10-CM

## 2017-11-10 DIAGNOSIS — K7460 Unspecified cirrhosis of liver: Secondary | ICD-10-CM | POA: Insufficient documentation

## 2017-11-10 NOTE — Patient Instructions (Signed)
Thank you for choosing [facility] to provide your oncology and hematology care.  To afford each patient quality time with our providers, please arrive 30 minutes before your scheduled appointment time.  If you arrive late for your appointment, you may be asked to reschedule.  We strive to give you quality time with our providers, and arriving late affects you and other patients whose appointments are after yours.   If you are a no show for multiple scheduled visits, you may be dismissed from the clinic at the providers discretion.    Again, thank you for choosing [facility], our hope is that these requests will decrease the amount of time that you wait before being seen by our physicians.  ______________________________________________________________________  Should you have questions after your visit to the [facility], please contact our office at [phone] between the hours of 8:30 and 4:30 p.m.    Voicemails left after 4:30p.m will not be returned until the following business day.    For prescription refill requests, please have your pharmacy contact us directly.  Please also try to allow 48 hours for prescription requests.    Please contact the scheduling department for questions regarding scheduling.  For scheduling of procedures such as PET scans, CT scans, MRI, Ultrasound, etc please contact central scheduling at [phone].    Resources For Cancer Patients and Caregivers:   Oncolink.org:  A wonderful resource for patients and healthcare providers for information regarding your disease, ways to tract your treatment, what to expect, etc.     American Cancer Society:  800-227-2345  Can help patients locate various types of support and financial assistance  Cancer Care: 1-800-813-HOPE (4673) Provides financial assistance, online support groups, medication/co-pay assistance.    [place] DSS:  336-641-3447 Where to apply for food  stamps, Medicaid, and utility assistance  Medicare Rights Center: 800-333-4114 Helps people with Medicare understand their rights and benefits, navigate the Medicare system, and secure the quality healthcare they deserve  SCAT: 336-333-6589 [employer] [employer]'s shared-ride transportation service for eligible riders who have a disability that prevents them from riding the fixed route bus.    For additional information on assistance programs please contact our social worker:   Grier Hock/Abigail Elmore:  [phone]            

## 2017-11-11 ENCOUNTER — Telehealth: Payer: Self-pay

## 2017-11-11 NOTE — Telephone Encounter (Signed)
Scheduled patient 6 month appointment unable to speak to the patient his phone was busy. Per 5/8 los. Will mail calender and letter for upcoming appointment.

## 2017-11-22 ENCOUNTER — Ambulatory Visit: Payer: Self-pay | Attending: Nurse Practitioner | Admitting: *Deleted

## 2017-11-22 VITALS — BP 130/82 | HR 54

## 2017-11-22 DIAGNOSIS — I10 Essential (primary) hypertension: Secondary | ICD-10-CM | POA: Insufficient documentation

## 2017-11-22 MED ORDER — NEBIVOLOL HCL 10 MG PO TABS: 10.0000 mg | ORAL_TABLET | Freq: Every day | ORAL | 3 refills | Status: DC

## 2017-11-22 MED ORDER — LOSARTAN POTASSIUM 50 MG PO TABS: 50.0000 mg | ORAL_TABLET | Freq: Every day | ORAL | 0 refills | Status: DC

## 2017-11-22 MED FILL — LOSARTAN POTASSIUM 50 MG TA: 50 | 30 days supply | Qty: 30 | Fill #0

## 2017-11-22 MED FILL — $BYSTOLIC 10 MG TABLET: 10 | 90 days supply | Qty: 90 | Fill #0

## 2017-11-22 NOTE — Progress Notes (Signed)
Mr. Timothy [name] arrived to [facility] alert and oriented in good spirits. He is here for nurse visit to recheck blood pressure.  At last OV on 10/25/2017  with PCP, his blood pressure reading was 163/94.  Today, pt denies chest pain, SOB, HA, dizziness, or blurred vision or concerns with medication. Verified medication with patient. Pt states medication was taken this morning.  Blood pressure reading: 156/101 Manual Recheck reading: 130/82  Takes blood pressure reading at home he records are: 135/89 122/82 137/89 136/77  He requested refill on losartan and bystolic .

## 2017-11-23 ENCOUNTER — Ambulatory Visit (INDEPENDENT_AMBULATORY_CARE_PROVIDER_SITE_OTHER): Payer: Self-pay

## 2017-11-23 ENCOUNTER — Ambulatory Visit (HOSPITAL_COMMUNITY)
Admission: EM | Admit: 2017-11-23 | Discharge: 2017-11-23 | Disposition: A | Discharge status: Home / Self Care | Payer: Self-pay | Attending: Family Medicine | Admitting: Family Medicine

## 2017-11-23 ENCOUNTER — Encounter (HOSPITAL_COMMUNITY): Payer: Self-pay | Admitting: Emergency Medicine

## 2017-11-23 ENCOUNTER — Other Ambulatory Visit: Payer: Self-pay

## 2017-11-23 DIAGNOSIS — S93601A Unspecified sprain of right foot, initial encounter: Secondary | ICD-10-CM

## 2017-11-23 DIAGNOSIS — S93401A Sprain of unspecified ligament of right ankle, initial encounter: Secondary | ICD-10-CM

## 2017-11-23 MED ORDER — IBUPROFEN 800 MG PO TABS: 800.0000 mg | ORAL_TABLET | Freq: Three times a day (TID) | ORAL | 0 refills | Status: DC

## 2017-11-23 MED ORDER — IBUPROFEN 800 MG PO TABS
800.0000 mg | ORAL_TABLET | Freq: Once | ORAL | Status: AC
  Administered 2017-11-23: 800 mg via ORAL

## 2017-11-23 MED ORDER — IBUPROFEN 800 MG PO TABS
ORAL_TABLET | ORAL | Status: AC
  Filled 2017-11-23: qty 1

## 2017-11-23 NOTE — Discharge Instructions (Addendum)
Ice, elevation, for pain and swellign control.  Ibuprofen every 8 hours for pain control. Use of ankle brace and crutches as needed.  May take a few weeks for this to heal up well.  May follow up with orthopedics if symptoms persist or do not improve in the next 2-3 weeks.

## 2017-11-23 NOTE — ED Provider Notes (Signed)
[place]    CSN: [id number] Arrival date & time: 11/23/17  1448     History   Chief Complaint Chief Complaint  Patient presents with  . Leg Injury    right    HPI Timothy [name] is a 57 y.o. male.   [name] presents with complaints of right foot and ankle pain after injury immediately prior to arrival. He states he was coming down a ladder when he started to fall, his right foot hooked on the ladder causing him to hang by his foot, twisting it. He eventually then fell to the ground. Denies head injury, chest pain, loss of consciousness, arm or shoulder pain. States he was able to get up with the help of his gf. Pain with any weight bearing however. Denies previous foot or ankle injury. Has not taken any medications for pain. Pain 7/10. No numbness or tingling. He is not on a blood thinner.    ROS per HPI.      Past Medical History:  Diagnosis Date  . Autoimmune hepatitis ([place])   . Cirrhosis of liver ([place])   . Hypertension   . Thrombocytopenic [facility])     Patient Active Problem List   Diagnosis Date Noted  . Thrombocytopenia ([place]) 10/24/2015  . Liver cirrhosis ([place]) 10/24/2015  . Hepatitis C 10/24/2015  . Autoimmune hepatitis ([place]) 03/28/2013  . Essential hypertension 11/15/2012  . ABNORMAL TRANSAMINASE-LFT'S 12/23/2007    History reviewed. No pertinent surgical history.     Home Medications    Prior to Admission medications   Medication Sig Start Date End Date Taking? Authorizing Provider  hydrochlorothiazide (HYDRODIURIL) 25 MG tablet Take 1 tablet (25 mg total) by mouth daily. 10/25/17  Yes Gildardo Pounds, NP  losartan (COZAAR) 50 MG tablet Take 1 tablet (50 mg total) by mouth daily. 11/22/17 02/20/18 Yes Gildardo Pounds, NP  nebivolol (BYSTOLIC) 10 MG tablet Take 1 tablet (10 mg total) by mouth daily. 11/22/17  Yes Gildardo Pounds, NP  ibuprofen (ADVIL,MOTRIN) 800 MG tablet Take 1 tablet (800 mg total) by mouth 3 (three) times daily. 11/23/17   Zigmund Gottron, NP    Family History Family History  Problem Relation Age of Onset  . Hypertension Sister     Social History Social History   Tobacco Use  . Smoking status: Never Smoker  . Smokeless tobacco: Never Used  Substance Use Topics  . Alcohol use: No    Alcohol/week: 0.0 oz  . Drug use: No     Allergies   Patient has no known allergies.   Review of Systems Review of Systems   Physical Exam Triage Vital Signs ED Triage Vitals  Enc Vitals Group     BP 11/23/17 1546 101/64     Pulse Rate 11/23/17 1546 (!) 50     Resp --      Temp 11/23/17 1546 98.3 F (36.8 C)     Temp Source 11/23/17 1546 Oral     SpO2 11/23/17 1546 99 %     Weight --      Height --      Head Circumference --      Peak Flow --      Pain Score 11/23/17 1545 7     Pain Loc --      Pain Edu? --      Excl. in [place]? --    No data found.  Updated Vital Signs BP 101/64 (BP Location: Left Arm)   Pulse Marland Kitchen)  50   Temp 98.3 F (36.8 C) (Oral)   SpO2 99%    Physical Exam  Constitutional: He is oriented to person, place, and time. He appears well-developed and well-nourished.  Cardiovascular: Regular rhythm. Bradycardia present.  Pulmonary/Chest: Effort normal and breath sounds normal.  Musculoskeletal:       Right knee: Normal.       Right ankle: He exhibits decreased range of motion and swelling. He exhibits no ecchymosis, no deformity, no laceration and normal pulse. Tenderness. Achilles tendon normal.       Left foot: There is tenderness and bony tenderness. There is normal range of motion, no swelling, normal capillary refill, no crepitus, no deformity and no laceration.       Feet:  Tenderness to dorsal foot and lateral right ankle; moving toes without difficulty; pain with dorsiflexion and flexion of ankle; cap refill <2 seconds; strong pedal pulse; sensation intact   Neurological: He is alert and oriented to person, place, and time.  Skin: Skin is warm and dry.     UC Treatments /  Results  Labs (all labs ordered are listed, but only abnormal results are displayed) Labs Reviewed - No data to display  EKG None  Radiology Dg Ankle Complete Right  Result Date: 11/23/2017 CLINICAL DATA:  Recent fall from ladder with ankle pain, initial encounter EXAM: RIGHT ANKLE - COMPLETE 3+ VIEW COMPARISON:  03/04/2005 FINDINGS: Lateral soft tissue swelling is noted. Degenerative changes in the tarsal bones are seen. No soft tissue abnormality is seen. No acute fracture is noted. IMPRESSION: Lateral soft tissue swelling.  No acute fracture is noted. Electronically Signed   By: Inez Catalina M.D.   On: 11/23/2017 16:26   Dg Foot Complete Right  Result Date: 11/23/2017 CLINICAL DATA:  Pain following fall EXAM: RIGHT FOOT COMPLETE - 3+ VIEW COMPARISON:  None. FINDINGS: Frontal, oblique, and lateral views were obtained. There is no appreciable fracture or dislocation. The joint spaces appear normal. No erosive change. There is spurring in the dorsal talonavicular joint with a benign exostosis along the dorsal distal talus. IMPRESSION: Spurring arising from the dorsal distal talus with spurring in the talonavicular joint. No fracture or dislocation. Other joint spaces appear unremarkable. Electronically Signed   By: Lowella Grip III M.D.   On: 11/23/2017 16:25    Procedures Procedures (including critical care time)  Medications Ordered in UC Medications  ibuprofen (ADVIL,MOTRIN) tablet 800 mg (800 mg Oral Given 11/23/17 1609)    Initial Impression / Assessment and Plan / UC Course  I have reviewed the triage vital signs and the nursing notes.  Pertinent labs & imaging results that were available during my care of the patient were reviewed by me and considered in my medical decision making (see chart for details).     Without acute findings on xray, consistent with strain at this time. Brace placed. Ibuprofen, ice, elevation, crutches for pain control . Return precautions provided.  Follow up with ortho as needed. Patient verbalized understanding and agreeable to plan.    Final Clinical Impressions(s) / UC Diagnoses   Final diagnoses:  Sprain of right foot, initial encounter  Sprain of right ankle, unspecified ligament, initial encounter     Discharge Instructions     Ice, elevation, for pain and swellign control.  Ibuprofen every 8 hours for pain control. Use of ankle brace and crutches as needed.  May take a few weeks for this to heal up well.  May follow up with orthopedics if  symptoms persist or do not improve in the next 2-3 weeks.     ED Prescriptions    Medication Sig Dispense Auth. Provider   ibuprofen (ADVIL,MOTRIN) 800 MG tablet Take 1 tablet (800 mg total) by mouth 3 (three) times daily. 21 tablet Zigmund Gottron, NP     Controlled Substance Prescriptions [place] Controlled Substance Registry consulted? Not Applicable   Zigmund Gottron, NP 11/23/17 1643

## 2017-11-23 NOTE — ED Triage Notes (Signed)
Pt states he fell off a ladder today and injured his right lower leg and foot.

## 2017-12-22 MED FILL — LOSARTAN POTASSIUM 50 MG TA: 50 | 30 days supply | Qty: 30 | Fill #1

## 2018-01-24 MED FILL — LOSARTAN POTASSIUM 50 MG TA: 50 | 30 days supply | Qty: 30 | Fill #2

## 2018-02-23 MED FILL — $BYSTOLIC 10 MG TABLET: 10 | 30 days supply | Qty: 30 | Fill #1

## 2018-03-29 MED FILL — $BYSTOLIC 10 MG TABLET: 10 | 90 days supply | Qty: 90 | Fill #2

## 2018-04-14 ENCOUNTER — Encounter: Payer: Self-pay | Admitting: Pharmacist

## 2018-04-14 ENCOUNTER — Ambulatory Visit: Payer: Self-pay | Admitting: Pharmacist

## 2018-04-14 MED FILL — HYDROCHLOROTHIAZIDE 25 MG T: 25 | 30 days supply | Qty: 30 | Fill #1

## 2018-04-14 MED FILL — LOSARTAN POTASSIUM 50 MG TA: 50 | 30 days supply | Qty: 30 | Fill #1

## 2018-04-14 NOTE — Progress Notes (Unsigned)
   S:   PCP: Geryl Rankins   Patient arrives ***.    Presents to the clinic for hypertension evaluation, counseling, and management. Patient was last seen in clinic by his PCP on 10/25/17. Has not followed since. BP was 163/94 at that visit.   Patient {Actions; denies-reports:120008} adherence with medications.  Current BP Medications include:   - Bystolic 10 mg daily - HCTZ 25 mg daily - Losartan 50 mg daily  Antihypertensives tried in the past include: ***  Dietary habits include: *** Exercise habits include:*** Family / Social history: ***  ASCVD risk factors include:***  Home BP readings: ***   .[email or web address]   O:  Physical Exam   ROS  Last 3 Office BP readings: BP Readings from Last 3 Encounters:  11/23/17 101/64  11/22/17 130/82  11/10/17 122/82    BMET    Component Value Date/Time   NA 142 10/25/2017 1548   NA 141 01/27/2016 1005   K 4.6 10/25/2017 1548   K 4.1 01/27/2016 1005   CL 108 (H) 10/25/2017 1548   CO2 23 10/25/2017 1548   CO2 24 01/27/2016 1005   GLUCOSE 63 (L) 10/25/2017 1548   GLUCOSE 98 01/27/2016 1005   BUN 16 10/25/2017 1548   BUN 15.0 01/27/2016 1005   CREATININE 1.14 10/25/2017 1548   CREATININE 1.2 01/27/2016 1005   CALCIUM 9.3 10/25/2017 1548   CALCIUM 8.8 01/27/2016 1005   GFRNONAA 71 10/25/2017 1548   GFRAA 82 10/25/2017 1548    Renal function: CrCl cannot be calculated (Patient's most recent lab result is older than the maximum 21 days allowed.).  A/P: Hypertension longstanding/newly diagnosed currently *** on current medications. BP Goal = *** mmHg. Patient {Is/is not:9024} adherent with current medications.  -{Meds adjust:18428} ***.  -F/u labs ordered - *** -Counseled on lifestyle modifications for blood pressure control including reduced dietary sodium, increased exercise, adequate sleep  Results reviewed and written information provided.   Total time in face-to-face counseling *** minutes.   F/U Clinic Visit in ***.   Patient seen with ***

## 2018-05-04 ENCOUNTER — Encounter: Payer: Self-pay | Admitting: Nurse Practitioner

## 2018-05-04 ENCOUNTER — Ambulatory Visit: Payer: Self-pay | Attending: Nurse Practitioner | Admitting: Nurse Practitioner

## 2018-05-04 DIAGNOSIS — Z8249 Family history of ischemic heart disease and other diseases of the circulatory system: Secondary | ICD-10-CM | POA: Insufficient documentation

## 2018-05-04 DIAGNOSIS — K7460 Unspecified cirrhosis of liver: Secondary | ICD-10-CM | POA: Insufficient documentation

## 2018-05-04 DIAGNOSIS — I10 Essential (primary) hypertension: Secondary | ICD-10-CM | POA: Insufficient documentation

## 2018-05-04 DIAGNOSIS — Z79899 Other long term (current) drug therapy: Secondary | ICD-10-CM | POA: Insufficient documentation

## 2018-05-04 MED ORDER — NEBIVOLOL HCL 10 MG PO TABS: 10.0000 mg | ORAL_TABLET | Freq: Every day | ORAL | 3 refills | Status: DC

## 2018-05-04 MED ORDER — LOSARTAN POTASSIUM 50 MG PO TABS: 50.0000 mg | ORAL_TABLET | Freq: Every day | ORAL | 2 refills | Status: DC

## 2018-05-04 MED ORDER — HYDROCHLOROTHIAZIDE 25 MG PO TABS: 25.0000 mg | ORAL_TABLET | Freq: Every day | ORAL | 3 refills | Status: DC

## 2018-05-04 NOTE — Patient Instructions (Signed)
DASH Eating Plan DASH stands for "Dietary Approaches to Stop Hypertension." The DASH eating plan is a healthy eating plan that has been shown to reduce high blood pressure (hypertension). It may also reduce your risk for type 2 diabetes, heart disease, and stroke. The DASH eating plan may also help with weight loss. What are tips for following this plan? General guidelines  Avoid eating more than 2,300 mg (milligrams) of salt (sodium) a day. If you have hypertension, you may need to reduce your sodium intake to 1,500 mg a day.  Limit alcohol intake to no more than 1 drink a day for nonpregnant women and 2 drinks a day for men. One drink equals 12 oz of beer, 5 oz of wine, or 1 oz of hard liquor.  Work with your health care provider to maintain a healthy body weight or to lose weight. Ask what an ideal weight is for you.  Get at least 30 minutes of exercise that causes your heart to beat faster (aerobic exercise) most days of the week. Activities may include walking, swimming, or biking.  Work with your health care provider or diet and nutrition specialist (dietitian) to adjust your eating plan to your individual calorie needs. Reading food labels  Check food labels for the amount of sodium per serving. Choose foods with less than 5 percent of the Daily Value of sodium. Generally, foods with less than 300 mg of sodium per serving fit into this eating plan.  To find whole grains, look for the word "whole" as the first word in the ingredient list. Shopping  Buy products labeled as "low-sodium" or "no salt added."  Buy fresh foods. Avoid canned foods and premade or frozen meals. Cooking  Avoid adding salt when cooking. Use salt-free seasonings or herbs instead of table salt or sea salt. Check with your health care provider or pharmacist before using salt substitutes.  Do not fry foods. Cook foods using healthy methods such as baking, boiling, grilling, and broiling instead.  Cook with  heart-healthy oils, such as olive, canola, soybean, or sunflower oil. Meal planning   Eat a balanced diet that includes: ? 5 or more servings of fruits and vegetables each day. At each meal, try to fill half of your plate with fruits and vegetables. ? Up to 6-8 servings of whole grains each day. ? Less than 6 oz of lean meat, poultry, or fish each day. A 3-oz serving of meat is about the same size as a deck of cards. One egg equals 1 oz. ? 2 servings of low-fat dairy each day. ? A serving of nuts, seeds, or beans 5 times each week. ? Heart-healthy fats. Healthy fats called Omega-3 fatty acids are found in foods such as flaxseeds and coldwater fish, like sardines, salmon, and mackerel.  Limit how much you eat of the following: ? Canned or prepackaged foods. ? Food that is high in trans fat, such as fried foods. ? Food that is high in saturated fat, such as fatty meat. ? Sweets, desserts, sugary drinks, and other foods with added sugar. ? Full-fat dairy products.  Do not salt foods before eating.  Try to eat at least 2 vegetarian meals each week.  Eat more home-cooked food and less restaurant, buffet, and fast food.  When eating at a restaurant, ask that your food be prepared with less salt or no salt, if possible. What foods are recommended? The items listed may not be a complete list. Talk with your dietitian about what   dietary choices are best for you. Grains Whole-grain or whole-wheat bread. Whole-grain or whole-wheat pasta. Brown rice. Oatmeal. Quinoa. Bulgur. Whole-grain and low-sodium cereals. Pita bread. Low-fat, low-sodium crackers. Whole-wheat flour tortillas. Vegetables Fresh or frozen vegetables (raw, steamed, roasted, or grilled). Low-sodium or reduced-sodium tomato and vegetable juice. Low-sodium or reduced-sodium tomato sauce and tomato paste. Low-sodium or reduced-sodium canned vegetables. Fruits All fresh, dried, or frozen fruit. Canned fruit in natural juice (without  added sugar). Meat and other protein foods Skinless chicken or turkey. Ground chicken or turkey. Pork with fat trimmed off. Fish and seafood. Egg whites. Dried beans, peas, or lentils. Unsalted nuts, nut butters, and seeds. Unsalted canned beans. Lean cuts of beef with fat trimmed off. Low-sodium, lean deli meat. Dairy Low-fat (1%) or fat-free (skim) milk. Fat-free, low-fat, or reduced-fat cheeses. Nonfat, low-sodium ricotta or cottage cheese. Low-fat or nonfat yogurt. Low-fat, low-sodium cheese. Fats and oils Soft margarine without trans fats. Vegetable oil. Low-fat, reduced-fat, or light mayonnaise and salad dressings (reduced-sodium). Canola, safflower, olive, soybean, and sunflower oils. Avocado. Seasoning and other foods Herbs. Spices. Seasoning mixes without salt. Unsalted popcorn and pretzels. Fat-free sweets. What foods are not recommended? The items listed may not be a complete list. Talk with your dietitian about what dietary choices are best for you. Grains Baked goods made with fat, such as croissants, muffins, or some breads. Dry pasta or rice meal packs. Vegetables Creamed or fried vegetables. Vegetables in a cheese sauce. Regular canned vegetables (not low-sodium or reduced-sodium). Regular canned tomato sauce and paste (not low-sodium or reduced-sodium). Regular tomato and vegetable juice (not low-sodium or reduced-sodium). Pickles. Olives. Fruits Canned fruit in a light or heavy syrup. Fried fruit. Fruit in cream or butter sauce. Meat and other protein foods Fatty cuts of meat. Ribs. Fried meat. Bacon. Sausage. Bologna and other processed lunch meats. Salami. Fatback. Hotdogs. Bratwurst. Salted nuts and seeds. Canned beans with added salt. Canned or smoked fish. Whole eggs or egg yolks. Chicken or turkey with skin. Dairy Whole or 2% milk, cream, and half-and-half. Whole or full-fat cream cheese. Whole-fat or sweetened yogurt. Full-fat cheese. Nondairy creamers. Whipped toppings.  Processed cheese and cheese spreads. Fats and oils Butter. Stick margarine. Lard. Shortening. Ghee. Bacon fat. Tropical oils, such as coconut, palm kernel, or palm oil. Seasoning and other foods Salted popcorn and pretzels. Onion salt, garlic salt, seasoned salt, table salt, and sea salt. Worcestershire sauce. Tartar sauce. Barbecue sauce. Teriyaki sauce. Soy sauce, including reduced-sodium. Steak sauce. Canned and packaged gravies. Fish sauce. Oyster sauce. Cocktail sauce. Horseradish that you find on the shelf. Ketchup. Mustard. Meat flavorings and tenderizers. Bouillon cubes. Hot sauce and Tabasco sauce. Premade or packaged marinades. Premade or packaged taco seasonings. Relishes. Regular salad dressings. Where to find more information:  National Heart, Lung, and Blood Institute: www.nhlbi.nih.gov  American Heart Association: www.heart.org Summary  The DASH eating plan is a healthy eating plan that has been shown to reduce high blood pressure (hypertension). It may also reduce your risk for type 2 diabetes, heart disease, and stroke.  With the DASH eating plan, you should limit salt (sodium) intake to 2,300 mg a day. If you have hypertension, you may need to reduce your sodium intake to 1,500 mg a day.  When on the DASH eating plan, aim to eat more fresh fruits and vegetables, whole grains, lean proteins, low-fat dairy, and heart-healthy fats.  Work with your health care provider or diet and nutrition specialist (dietitian) to adjust your eating plan to your individual   calorie needs. This information is not intended to replace advice given to you by your health care provider. Make sure you discuss any questions you have with your health care provider. Document Released: 06/11/2011 Document Revised: 06/15/2016 Document Reviewed: 06/15/2016 Elsevier Interactive Patient Education  2018 Elsevier Inc.  Cooking With Less Salt Cooking with less salt is one way to reduce the amount of sodium you  get from food. Depending on your condition and overall health, your health care provider or diet and nutrition specialist (dietitian) may recommend that you reduce your sodium intake. Most people should have less than 2,300 milligrams (mg) of sodium each day. If you have high blood pressure (hypertension), you may need to limit your sodium to 1,500 mg each day. Follow the tips below to help reduce your sodium intake. What do I need to know about cooking with less salt? Shopping  Buy sodium-free or low-sodium products. Look for the following words on food labels: ? Low-sodium. ? Sodium-free. ? Reduced-sodium. ? No salt added. ? Unsalted.  Buy fresh or frozen vegetables. Avoid canned vegetables.  Avoid buying meats or protein foods that have been injected with broth or saline solution.  Avoid cured or smoked meats, such as hot dogs, bacon, salami, ham, and bologna. Reading food labels  Check the food label before buying or using packaged ingredients.  Look for products with no more than 140 mg of sodium in one serving.  Do not choose foods with salt as one of the first three ingredients on the ingredients list. If salt is one of the first three ingredients, it usually means the item is high in sodium, because ingredients are listed in order of amount in the food item. Cooking  Use herbs, seasonings without salt, and spices as substitutes for salt in foods.  Use sodium-free baking soda when baking.  Grill, braise, or roast foods to add flavor with less salt.  Avoid adding salt to pasta, rice, or hot cereals while cooking.  Drain and rinse canned vegetables before use.  Avoid adding salt when cooking sweets and desserts.  Cook with low-sodium ingredients. What are some salt alternatives? The following are herbs, seasonings, and spices that can be used instead of salt to give taste to your food. Herbs should be fresh or dried. Do not choose packaged mixes. Next to the name of the  herb, spice, or seasoning are some examples of foods you can pair it with. Herbs  Bay leaves - Soups, meat and vegetable dishes, and spaghetti sauce.  Basil - Italian dishes, soups, pasta, and fish dishes.  Cilantro - Meat, poultry, and vegetable dishes.  Chili powder - Marinades and Mexican dishes.  Chives - Salad dressings and potato dishes.  Cumin - Mexican dishes, couscous, and meat dishes.  Dill - Fish dishes, sauces, and salads.  Fennel - Meat and vegetable dishes, breads, and cookies.  Garlic (do not use garlic salt) - Italian dishes, meat dishes, salad dressings, and sauces.  Marjoram - Soups, potato dishes, and meat dishes.  Oregano - Pizza and spaghetti sauce.  Parsley - Salads, soups, pasta, and meat dishes.  Rosemary - Italian dishes, salad dressings, soups, and red meats.  Saffron - Fish dishes, pasta, and some poultry dishes.  Sage - Stuffings and sauces.  Tarragon - Fish and poultry dishes.  Thyme - Stuffing, meat, and fish dishes. Seasonings  Lemon juice - Fish dishes, poultry dishes, vegetables, and salads.  Vinegar - Salad dressings, vegetables, and fish dishes. Spices  Cinnamon -   Sweet dishes, such as cakes, cookies, and puddings.  Cloves - Gingerbread, puddings, and marinades for meats.  Curry - Vegetable dishes, fish and poultry dishes, and stir-fry dishes.  Ginger - Vegetables dishes, fish dishes, and stir-fry dishes.  Nutmeg - Pasta, vegetables, poultry, fish dishes, and custard. What are some low-sodium ingredients and foods?  Fresh or frozen fruits and vegetables with no sauce added.  Fresh or frozen whole meats, poultry, and fish with no sauce added.  Eggs.  Noodles, pasta, quinoa, rice.  Shredded or puffed wheat or puffed rice.  Regular or quick oats.  Milk, yogurt, hard cheeses, and low-sodium cheeses. Good cheese choices include Swiss, Monterey Jack, and mozzarella. Always check the label for the serving size and sodium  content.  Unsalted butter or margarine.  Unsalted nuts.  Sherbet or ice cream (keep to  cup per serving).  Homemade pudding.  Sodium-free baking soda and baking powder. This is not a complete list of low-sodium ingredients and foods. Contact your dietitian for more options. Summary  Cooking with less salt is one way to reduce the amount of sodium that you get from food.  Buy sodium-free or low-sodium products.  Check the food label before using or buying packaged ingredients.  Use herbs, seasonings without salt, and spices as substitutes for salt in foods. This information is not intended to replace advice given to you by your health care provider. Make sure you discuss any questions you have with your health care provider. Document Released: 06/22/2005 Document Revised: 06/30/2016 Document Reviewed: 06/30/2016 Elsevier Interactive Patient Education  2017 Elsevier Inc.  

## 2018-05-04 NOTE — Progress Notes (Signed)
Assessment & Plan:  Timothy [name] was seen today for follow-up.  Diagnoses and all orders for this visit:  Essential hypertension -     hydrochlorothiazide (HYDRODIURIL) 25 MG tablet; Take 1 tablet (25 mg total) by mouth daily. -     nebivolol (BYSTOLIC) 10 MG tablet; Take 1 tablet (10 mg total) by mouth daily. -     losartan (COZAAR) 50 MG tablet; Take 1 tablet (50 mg total) by mouth daily. Continue all antihypertensives as prescribed.  Remember to bring in your blood pressure log with you for your follow up appointment.  DASH/Mediterranean Diets are healthier choices for HTN.    Patient has been counseled on age-appropriate routine health concerns for screening and prevention. These are reviewed and up-to-date. Referrals have been placed accordingly. Immunizations are up-to-date or declined.    Subjective:   Chief Complaint  Patient presents with  . Follow-up    Pt. is here to follow-up on hypertension.    HPI Timothy [name] 57 y.o. male presents to office today for HTN.   Essential Hypertension Blood pressure is well controlled today. He endorses medication compliance taking losartan 50mg , HCTZ 25 mg and bystolic 10mg  daily. He does not monitor his blood pressure at home.  BP Readings from Last 3 Encounters:  05/04/18 138/90  11/23/17 101/64  11/22/17 130/82   Chest pain: no   Dyspnea: no   Claudication: no  Medication compliance: yes  Medication Side Effects  Lightheadedness: no   Urinary frequency: no   Edema: no   Impotence: no  Preventitive Healthcare:  Exercise: no   Diet Pattern: diet: general  Salt Restriction:  no        Review of Systems  Constitutional: Negative for fever, malaise/fatigue and weight loss.  HENT: Negative.  Negative for nosebleeds.   Eyes: Negative.  Negative for blurred vision, double vision and photophobia.  Respiratory: Negative.  Negative for cough and shortness of breath.   Cardiovascular: Negative.  Negative for chest pain, palpitations  and leg swelling.  Gastrointestinal: Negative.  Negative for heartburn, nausea and vomiting.  Musculoskeletal: Negative.  Negative for myalgias.  Neurological: Negative.  Negative for dizziness, focal weakness, seizures and headaches.  Psychiatric/Behavioral: Negative.  Negative for suicidal ideas.    Past Medical History:  Diagnosis Date  . Autoimmune hepatitis ([place])   . Cirrhosis of liver ([place])   . Hypertension   . Thrombocytopenic ([place])     History reviewed. No pertinent surgical history.  Family History  Problem Relation Age of Onset  . Hypertension Sister     Social History Reviewed with no changes to be made today.   Outpatient Medications Prior to Visit  Medication Sig Dispense Refill  . hydrochlorothiazide (HYDRODIURIL) 25 MG tablet Take 1 tablet (25 mg total) by mouth daily. 90 tablet 3  . nebivolol (BYSTOLIC) 10 MG tablet Take 1 tablet (10 mg total) by mouth daily. 30 tablet 3  . ibuprofen (ADVIL,MOTRIN) 800 MG tablet Take 1 tablet (800 mg total) by mouth 3 (three) times daily. (Patient not taking: Reported on 05/04/2018) 21 tablet 0  . losartan (COZAAR) 50 MG tablet Take 1 tablet (50 mg total) by mouth daily. 30 tablet 0   No facility-administered medications prior to visit.     No Known Allergies     Objective:    BP 138/90 (BP Location: Left Arm, Patient Position: Sitting, Cuff Size: Normal)   Pulse 62   Temp 98.4 F (36.9 C) (Oral)   Ht 6' (1.829 m)  Wt 224 lb (101.6 kg)   SpO2 96%   BMI 30.38 kg/m  Wt Readings from Last 3 Encounters:  05/04/18 224 lb (101.6 kg)  11/10/17 223 lb (101.2 kg)  10/25/17 230 lb (104.3 kg)    Physical Exam  Constitutional: He is oriented to person, place, and time. He appears well-developed and well-nourished. He is cooperative.  HENT:  Head: Normocephalic and atraumatic.  Eyes: EOM are normal.  Neck: Normal range of motion.  Cardiovascular: Normal rate, regular rhythm and normal heart sounds. Exam reveals no  gallop and no friction rub.  No murmur heard. Pulmonary/Chest: Effort normal and breath sounds normal. No tachypnea. No respiratory distress. He has no decreased breath sounds. He has no wheezes. He has no rhonchi. He has no rales. He exhibits no tenderness.  Abdominal: Bowel sounds are normal.  Musculoskeletal: Normal range of motion. He exhibits no edema.  Neurological: He is alert and oriented to person, place, and time. Coordination normal.  Skin: Skin is warm and dry.  Psychiatric: He has a normal mood and affect. His behavior is normal. Judgment and thought content normal.  Nursing note and vitals reviewed.      Patient has been counseled extensively about nutrition and exercise as well as the importance of adherence with medications and regular follow-up. The patient was given clear instructions to go to ER or return to medical center if symptoms don't improve, worsen or new problems develop. The patient verbalized understanding.   Follow-up: Return in about 3 months (around 08/04/2018) for FASTING labs and Physical.   Timothy Pounds, FNP-BC [facility] and [place], [place]   05/04/2018, 2:44 PM

## 2018-05-10 ENCOUNTER — Telehealth: Payer: Self-pay | Admitting: Hematology

## 2018-05-10 NOTE — Telephone Encounter (Signed)
GK out 11/7 - per [place] moved appointment one month out. Left message re lab/fu 12/2. Schedule mailed.

## 2018-05-12 ENCOUNTER — Other Ambulatory Visit: Payer: Self-pay

## 2018-05-12 ENCOUNTER — Ambulatory Visit: Payer: Self-pay | Admitting: Hematology

## 2018-05-16 MED FILL — HYDROCHLOROTHIAZIDE 25 MG T: 25 | 30 days supply | Qty: 30 | Fill #2

## 2018-05-16 MED FILL — LOSARTAN POTASSIUM 50 MG TA: 50 | 30 days supply | Qty: 30 | Fill #2

## 2018-06-06 ENCOUNTER — Ambulatory Visit: Payer: Self-pay | Admitting: Hematology

## 2018-06-06 ENCOUNTER — Other Ambulatory Visit: Payer: Self-pay

## 2018-06-30 ENCOUNTER — Ambulatory Visit: Payer: Self-pay | Attending: Family Medicine | Admitting: Physician Assistant

## 2018-06-30 VITALS — BP 160/94 | HR 59 | Temp 98.0°F | Resp 18 | Ht 73.56 in | Wt 223.0 lb

## 2018-06-30 DIAGNOSIS — D696 Thrombocytopenia, unspecified: Secondary | ICD-10-CM | POA: Insufficient documentation

## 2018-06-30 DIAGNOSIS — Z79899 Other long term (current) drug therapy: Secondary | ICD-10-CM | POA: Insufficient documentation

## 2018-06-30 DIAGNOSIS — I10 Essential (primary) hypertension: Secondary | ICD-10-CM | POA: Insufficient documentation

## 2018-06-30 DIAGNOSIS — T465X6A Underdosing of other antihypertensive drugs, initial encounter: Secondary | ICD-10-CM | POA: Insufficient documentation

## 2018-06-30 DIAGNOSIS — K7460 Unspecified cirrhosis of liver: Secondary | ICD-10-CM | POA: Insufficient documentation

## 2018-06-30 DIAGNOSIS — Z760 Encounter for issue of repeat prescription: Secondary | ICD-10-CM | POA: Insufficient documentation

## 2018-06-30 MED ORDER — LOSARTAN POTASSIUM 50 MG PO TABS
50.0000 mg | ORAL_TABLET | Freq: Every day | ORAL | 2 refills | Status: DC
Start: 2018-06-30 — End: 2019-07-17

## 2018-06-30 MED ORDER — NEBIVOLOL HCL 10 MG PO TABS: 10.0000 mg | ORAL_TABLET | Freq: Every day | ORAL | 3 refills | Status: DC

## 2018-06-30 MED ORDER — HYDROCHLOROTHIAZIDE 25 MG PO TABS: 25.0000 mg | ORAL_TABLET | Freq: Every day | ORAL | 3 refills | Status: DC

## 2018-06-30 MED FILL — LOSARTAN POTASSIUM 50 MG TA: 50 | 90 days supply | Qty: 90 | Fill #0

## 2018-06-30 NOTE — Progress Notes (Signed)
Patient ID: Timothy [name], male   DOB: 07/05/1961, 57 y.o.   MRN: [id number]     Timothy [name], is a 57 y.o. male  ZRA:[id number]  [id number]  DOB - 1961/03/06  Subjective:  Chief Complaint and HPI: Timothy [name] is a 57 y.o. male here today for medication RF and appears to be overdue for blood work.  He has been out of losartan for a week and didn't realize he had a new prescription he could have called the pharmacy.  He denies HA/CP/Dizziness/SOB.    ROS:   Constitutional:  No f/c, No night sweats, No unexplained weight loss. EENT:  No vision changes, No blurry vision, No hearing changes. No mouth, throat, or ear problems.  Respiratory: No cough, No SOB Cardiac: No CP, no palpitations GI:  No abd pain, No N/V/D. GU: No Urinary s/sx Musculoskeletal: No joint pain Neuro: No headache, no dizziness, no motor weakness.  Skin: No rash Endocrine:  No polydipsia. No polyuria.  Psych: Denies SI/HI  No problems updated.  ALLERGIES: No Known Allergies  PAST MEDICAL HISTORY: Past Medical History:  Diagnosis Date  . Autoimmune hepatitis ([place])   . Cirrhosis of liver ([place])   . Hypertension   . Thrombocytopenic ([place])     MEDICATIONS AT HOME: Prior to Admission medications   Medication Sig Start Date End Date Taking? Authorizing Provider  hydrochlorothiazide (HYDRODIURIL) 25 MG tablet Take 1 tablet (25 mg total) by mouth daily. 06/30/18  Yes Freeman Caldron M, PA-C  ibuprofen (ADVIL,MOTRIN) 800 MG tablet Take 1 tablet (800 mg total) by mouth 3 (three) times daily. 11/23/17  Yes Burky, Lanelle Bal B, NP  losartan (COZAAR) 50 MG tablet Take 1 tablet (50 mg total) by mouth daily. 06/30/18 09/28/18 Yes Jarrick Fjeld, Dionne Bucy, PA-C  nebivolol (BYSTOLIC) 10 MG tablet Take 1 tablet (10 mg total) by mouth daily. 06/30/18  Yes Argentina Donovan, PA-C     Objective:  EXAM:   Vitals:   06/30/18 1118  BP: (!) 160/94  Pulse: (!) 59  Resp: 18  Temp: 98 F (36.7 C)  TempSrc: Oral  SpO2: 98%  Weight: 223  lb (101.2 kg)  Height: 6' 1.56" (1.868 m)    General appearance : A&OX3. NAD. Non-toxic-appearing HEENT: Atraumatic and Normocephalic.  PERRLA. EOM intact.   Neck: supple, no JVD. No cervical lymphadenopathy. No thyromegaly Chest/Lungs:  Breathing-non-labored, Good air entry bilaterally, breath sounds normal without rales, rhonchi, or wheezing  CVS: S1 S2 regular, no murmurs, gallops, rubs . Extremities: Bilateral Lower Ext shows no edema, both legs are warm to touch with = pulse throughout Neurology:  CN II-XII grossly intact, Non focal.   Psych:  TP linear. J/I WNL. Normal speech. Appropriate eye contact and affect.  Skin:  No Rash  Data Review Lab Results  Component Value Date   HGBA1C 6.0 (H) 11/25/2016   HGBA1C 5.40 03/29/2015   HGBA1C 5.6 11/15/2012     Assessment & Plan   1. Essential hypertension Not at goal but out of losartan for 1 week-resume all BP meds.  Check BP OOO and record and bring to next visit.   - nebivolol (BYSTOLIC) 10 MG tablet; Take 1 tablet (10 mg total) by mouth daily.  Dispense: 90 tablet; Refill: 3 - losartan (COZAAR) 50 MG tablet; Take 1 tablet (50 mg total) by mouth daily.  Dispense: 90 tablet; Refill: 2 - hydrochlorothiazide (HYDRODIURIL) 25 MG tablet; Take 1 tablet (25 mg total) by mouth daily.  Dispense: 90 tablet; Refill: 3 - Comprehensive  metabolic panel - CBC with Differential/Platelet  2. Thrombocytopenia ([place]) -check cbc   Patient have been counseled extensively about nutrition and exercise  Return in about 6 months (around 12/30/2018) for [place]-BP and thrombocytopenia.  The patient was given clear instructions to go to ER or return to medical center if symptoms don't improve, worsen or new problems develop. The patient verbalized understanding. The patient was told to call to get lab results if they haven't heard anything in the next week.     Freeman Caldron, PA-C [facility] and [facility],  [place]   06/30/2018, 11:27 AM

## 2018-07-01 ENCOUNTER — Telehealth (INDEPENDENT_AMBULATORY_CARE_PROVIDER_SITE_OTHER): Payer: Self-pay

## 2018-07-01 LAB — COMPREHENSIVE METABOLIC PANEL
ALBUMIN: 3.6 g/dL (ref 3.5–5.5)
ALK PHOS: 94 IU/L (ref 39–117)
ALT: 35 IU/L (ref 0–44)
AST: 61 IU/L — ABNORMAL HIGH (ref 0–40)
Albumin/Globulin Ratio: 0.9 — ABNORMAL LOW (ref 1.2–2.2)
BUN / CREAT RATIO: 15 (ref 9–20)
BUN: 19 mg/dL (ref 6–24)
Bilirubin Total: 0.6 mg/dL (ref 0.0–1.2)
CALCIUM: 9.1 mg/dL (ref 8.7–10.2)
CO2: 23 mmol/L (ref 20–29)
CREATININE: 1.24 mg/dL (ref 0.76–1.27)
Chloride: 108 mmol/L — ABNORMAL HIGH (ref 96–106)
GFR calc Af Amer: 74 mL/min/{1.73_m2} (ref >59)
GFR, EST NON AFRICAN AMERICAN: 64 mL/min/{1.73_m2} (ref >59)
Globulin, Total: 3.8 g/dL (ref 1.5–4.5)
Glucose: 93 mg/dL (ref 65–99)
Potassium: 4.6 mmol/L (ref 3.5–5.2)
SODIUM: 140 mmol/L (ref 134–144)
Total Protein: 7.4 g/dL (ref 6.0–8.5)

## 2018-07-01 LAB — CBC WITH DIFFERENTIAL/PLATELET
BASOS: 1 %
Basophils Absolute: 0 10*3/uL (ref 0.0–0.2)
EOS (ABSOLUTE): 0.4 10*3/uL (ref 0.0–0.4)
EOS: 10 %
HEMATOCRIT: 42.4 % (ref 37.5–51.0)
HEMOGLOBIN: 13.9 g/dL (ref 13.0–17.7)
Immature Grans (Abs): 0 10*3/uL (ref 0.0–0.1)
Immature Granulocytes: 0 %
LYMPHS ABS: 1.6 10*3/uL (ref 0.7–3.1)
Lymphs: 42 %
MCH: 28.5 pg (ref 26.6–33.0)
MCHC: 32.8 g/dL (ref 31.5–35.7)
MCV: 87 fL (ref 79–97)
MONOS ABS: 0.5 10*3/uL (ref 0.1–0.9)
Monocytes: 14 %
NEUTROS ABS: 1.2 10*3/uL — AB (ref 1.4–7.0)
Neutrophils: 33 %
Platelets: 62 10*3/uL — CL (ref 150–450)
RBC: 4.88 x10E6/uL (ref 4.14–5.80)
RDW: 14.3 % (ref 12.3–15.4)
WBC: 3.8 10*3/uL (ref 3.4–10.8)

## 2018-07-01 NOTE — Telephone Encounter (Signed)
-----   Message from Argentina Donovan, [place] sent at 07/01/2018  8:29 AM EST ----- Please call patient.  One of his LFT is a little elevated so he should avoid alcohol and products containing tylenol.  His platelets are low (about the same as usual)-this could put him at risk for bleeding.  He should also avoid alcohol and aspirin products for this reason.  Follow up as planned.  Thanks, Freeman Caldron, PA-C

## 2018-07-01 NOTE — Telephone Encounter (Signed)
Left voicemail asking patient to return call to [facility] at [phone]. Nat Christen, CMA

## 2018-07-08 ENCOUNTER — Telehealth: Payer: Self-pay | Admitting: *Deleted

## 2018-07-08 NOTE — Telephone Encounter (Signed)
Philippa Chester, RN        Please call patient. One of his LFT is a little elevated so he should avoid alcohol and products containing tylenol. His platelets are low (about the same as usual)-this could put him at risk for bleeding. He should also avoid alcohol and aspirin products for this reason. Follow up as planned. Thanks, Freeman Caldron, PA-C    Pt name and DOB verified. Patient aware of results and result note per Freeman Caldron PA-C.  Pt denies alcohol intake. States he does not drink nor does he take Tylenol. He return states he occasionally will take Advil. Advised about platelet count. Pt advised that PCP will continue to monitor. Please advise.

## 2018-07-09 NOTE — Telephone Encounter (Signed)
Patient should avoid all NSAIDs and aspirin. Ibuprofen, advil. Aleve, motrin and Naproxen.

## 2018-07-13 MED FILL — $BYSTOLIC 10 MG TABLET: 10 | 90 days supply | Qty: 90 | Fill #0

## 2018-07-18 NOTE — Telephone Encounter (Signed)
Pt aware. He was advised to speak about this at his OV on 08/05/2018.

## 2018-08-05 ENCOUNTER — Encounter: Payer: Self-pay | Admitting: Nurse Practitioner

## 2018-08-05 ENCOUNTER — Ambulatory Visit: Payer: Self-pay | Attending: Nurse Practitioner | Admitting: Nurse Practitioner

## 2018-08-05 VITALS — BP 160/100 | HR 61 | Temp 98.0°F | Ht 73.56 in | Wt 224.4 lb

## 2018-08-05 DIAGNOSIS — B351 Tinea unguium: Secondary | ICD-10-CM

## 2018-08-05 DIAGNOSIS — I10 Essential (primary) hypertension: Secondary | ICD-10-CM

## 2018-08-05 DIAGNOSIS — Z0000 Encounter for general adult medical examination without abnormal findings: Secondary | ICD-10-CM

## 2018-08-05 DIAGNOSIS — R0981 Nasal congestion: Secondary | ICD-10-CM

## 2018-08-05 MED ORDER — HYDROCHLOROTHIAZIDE 25 MG PO TABS
25.0000 mg | ORAL_TABLET | Freq: Every day | ORAL | 3 refills | Status: DC
Start: 2018-08-05 — End: 2018-11-07

## 2018-08-05 MED ORDER — FLUTICASONE PROPIONATE 50 MCG/ACT NA SUSP: 2.0000 | Freq: Every day | NASAL | 6 refills | Status: DC

## 2018-08-05 MED FILL — HYDROCHLOROTHIAZIDE 25 MG T: 25 | 30 days supply | Qty: 30 | Fill #0

## 2018-08-05 MED FILL — FLUTICASONE PROP 50 MCG SPR: 50 | 30 days supply | Qty: 16 | Fill #0

## 2018-08-05 NOTE — Progress Notes (Signed)
Assessment & Plan:  Timothy [name] was seen today for annual exam.  Diagnoses and all orders for this visit:  Encounter for annual physical exam -     Lipid panel  Essential hypertension -     hydrochlorothiazide (HYDRODIURIL) 25 MG tablet; Take 1 tablet (25 mg total) by mouth daily. Continue all antihypertensives as prescribed.  Remember to bring in your blood pressure log with you for your follow up appointment.  DASH/Mediterranean Diets are healthier choices for HTN.    Onychomycosis -     Ambulatory referral to Podiatry  Nasal congestion -     fluticasone (FLONASE) 50 MCG/ACT nasal spray; Place 2 sprays into both nostrils daily.    Patient has been counseled on age-appropriate routine health concerns for screening and prevention. These are reviewed and up-to-date. Referrals have been placed accordingly. Immunizations are up-to-date or declined.    Subjective:   Chief Complaint  Patient presents with  . Annual Exam   HPI Timothy [name] 58 y.o. male presents to office today for annual physical exam.   Essential Hypertension Blood pressure is elevated. He does not monior his blood pressure at home and reports he has been out of his HCTZ for over a week. Will refill today and have him return for BP recheck in a few weeks. Other antihypertensives include losartan 50mg  daily and bystolic 10mg  daily. He denies chest pain, shortness of breath, palpitations, lightheadedness, dizziness, headaches or BLE edema.  BP Readings from Last 3 Encounters:  08/05/18 (!) 160/100  06/30/18 (!) 160/94  05/04/18 138/90    Review of Systems  Constitutional: Negative for fever, malaise/fatigue and weight loss.  HENT: Positive for congestion. Negative for nosebleeds.   Eyes: Negative.  Negative for blurred vision, double vision and photophobia.  Respiratory: Negative.  Negative for cough and shortness of breath.   Cardiovascular: Negative.  Negative for chest pain, palpitations and leg swelling.    Gastrointestinal: Negative.  Negative for heartburn, nausea and vomiting.  Genitourinary: Negative.   Musculoskeletal: Negative.  Negative for myalgias.  Skin: Negative.   Neurological: Negative.  Negative for dizziness, focal weakness, seizures and headaches.  Endo/Heme/Allergies: Negative.   Psychiatric/Behavioral: Negative.  Negative for suicidal ideas.    Past Medical History:  Diagnosis Date  . Autoimmune hepatitis ([place])   . Cirrhosis of liver ([place])   . Hypertension   . Thrombocytopenic ([place])     No past surgical history on file.  Family History  Problem Relation Age of Onset  . Hypertension Sister     Social History Reviewed with no changes to be made today.   Outpatient Medications Prior to Visit  Medication Sig Dispense Refill  . losartan (COZAAR) 50 MG tablet Take 1 tablet (50 mg total) by mouth daily. 90 tablet 2  . nebivolol (BYSTOLIC) 10 MG tablet Take 1 tablet (10 mg total) by mouth daily. 90 tablet 3  . hydrochlorothiazide (HYDRODIURIL) 25 MG tablet Take 1 tablet (25 mg total) by mouth daily. 90 tablet 3  . ibuprofen (ADVIL,MOTRIN) 800 MG tablet Take 1 tablet (800 mg total) by mouth 3 (three) times daily. 21 tablet 0   No facility-administered medications prior to visit.     No Known Allergies     Objective:    BP (!) 160/100   Pulse 61   Temp 98 F (36.7 C) (Oral)   Ht 6' 1.56" (1.868 m)   Wt 224 lb 6.4 oz (101.8 kg)   SpO2 98%   BMI 29.16 kg/m  Wt Readings from Last 3 Encounters:  08/05/18 224 lb 6.4 oz (101.8 kg)  06/30/18 223 lb (101.2 kg)  05/04/18 224 lb (101.6 kg)    Physical Exam Exam conducted with a chaperone present.  Constitutional:      Appearance: He is well-developed.  HENT:     Head: Normocephalic and atraumatic.     Right Ear: Hearing, tympanic membrane, ear canal and external ear normal.     Left Ear: Hearing, tympanic membrane, ear canal and external ear normal.     Nose: Nose normal. No mucosal edema or rhinorrhea.      Right Turbinates: Swollen and pale.     Left Turbinates: Swollen and pale.     Mouth/Throat:     Pharynx: Uvula midline.     Tonsils: No tonsillar exudate. Swelling: 1+ on the right. 1+ on the left.  Eyes:     General: Lids are normal. No scleral icterus.    Conjunctiva/sclera: Conjunctivae normal.     Pupils: Pupils are equal, round, and reactive to light.  Neck:     Musculoskeletal: Normal range of motion and neck supple.     Thyroid: No thyromegaly.     Trachea: No tracheal deviation.  Cardiovascular:     Rate and Rhythm: Normal rate and regular rhythm.     Heart sounds: Normal heart sounds. No murmur. No friction rub. No gallop.   Pulmonary:     Effort: Pulmonary effort is normal. No respiratory distress.     Breath sounds: Normal breath sounds. No decreased breath sounds, wheezing, rhonchi or rales.  Chest:     Chest wall: No mass or tenderness.     Breasts:        Right: No inverted nipple, mass, nipple discharge, skin change or tenderness.        Left: No inverted nipple, mass, nipple discharge, skin change or tenderness.  Abdominal:     General: Abdomen is protuberant. Bowel sounds are normal. There is no distension.     Palpations: Abdomen is soft. There is no mass.     Tenderness: There is no abdominal tenderness. There is no guarding or rebound.     Hernia: There is no hernia in the right inguinal area or left inguinal area.  Genitourinary:    Penis: Normal and uncircumcised.      Scrotum/Testes: Normal.        Right: Mass, tenderness or swelling not present. Right testis is descended. Cremasteric reflex is present.         Left: Mass, tenderness or swelling not present. Left testis is descended. Cremasteric reflex is present.   Musculoskeletal: Normal range of motion.        General: No tenderness or deformity.  Feet:     Right foot:     Toenail Condition: Right toenails are abnormally thick and long. Fungal disease present.    Left foot:     Toenail Condition:  Fungal disease present. Lymphadenopathy:     Cervical: No cervical adenopathy.     Lower Body: No right inguinal adenopathy. No left inguinal adenopathy.  Skin:    General: Skin is warm and dry.     Capillary Refill: Capillary refill takes less than 2 seconds.     Findings: No erythema.  Neurological:     Mental Status: He is alert and oriented to person, place, and time.     Cranial Nerves: No cranial nerve deficit.     Motor: No abnormal muscle tone.  Coordination: Coordination normal.     Gait: Gait is intact.     Deep Tendon Reflexes: Reflexes normal.     Reflex Scores:      Patellar reflexes are 2+ on the right side and 2+ on the left side. Psychiatric:        Behavior: Behavior normal.        Thought Content: Thought content normal.        Judgment: Judgment normal.          Patient has been counseled extensively about nutrition and exercise as well as the importance of adherence with medications and regular follow-up. The patient was given clear instructions to go to ER or return to medical center if symptoms don't improve, worsen or new problems develop. The patient verbalized understanding.   Follow-up: Return in about 3 months (around 11/03/2018).   Gildardo Pounds, FNP-BC [facility] [place], [place]   08/05/2018, 8:22 PM

## 2018-08-06 LAB — LIPID PANEL
CHOLESTEROL TOTAL: 166 mg/dL (ref 100–199)
Chol/HDL Ratio: 2.7 ratio (ref 0.0–5.0)
HDL: 62 mg/dL (ref >39)
LDL CALC: 94 mg/dL (ref 0–99)
TRIGLYCERIDES: 50 mg/dL (ref 0–149)
VLDL CHOLESTEROL CAL: 10 mg/dL (ref 5–40)

## 2018-10-04 MED FILL — $BYSTOLIC 10 MG TABLET: 10 | 90 days supply | Qty: 90 | Fill #1

## 2018-10-04 MED FILL — LOSARTAN POTASSIUM 50 MG TA: 50 | 90 days supply | Qty: 90 | Fill #1

## 2018-11-07 ENCOUNTER — Encounter: Payer: Self-pay | Admitting: Nurse Practitioner

## 2018-11-07 ENCOUNTER — Ambulatory Visit: Payer: Self-pay | Attending: Nurse Practitioner | Admitting: Nurse Practitioner

## 2018-11-07 DIAGNOSIS — I10 Essential (primary) hypertension: Secondary | ICD-10-CM

## 2018-11-07 DIAGNOSIS — D696 Thrombocytopenia, unspecified: Secondary | ICD-10-CM

## 2018-11-07 DIAGNOSIS — K7460 Unspecified cirrhosis of liver: Secondary | ICD-10-CM

## 2018-11-07 MED ORDER — CHLORTHALIDONE 25 MG PO TABS: 25.0000 mg | ORAL_TABLET | Freq: Every day | ORAL | 1 refills | Status: DC

## 2018-11-07 MED FILL — CHLORTHALIDONE 25 MG TAB: 25 | 30 days supply | Qty: 30 | Fill #0

## 2018-11-07 MED FILL — FLUTICASONE PROP 50 MCG SPR: 50 | 30 days supply | Qty: 16 | Fill #1

## 2018-11-07 NOTE — Progress Notes (Signed)
Virtual Visit via Telephone Note Due to national recommendations of social distancing due to [place] 19, telehealth visit is felt to be most appropriate for this patient at this time.  I discussed the limitations, risks, security and privacy concerns of performing an evaluation and management service by telephone and the availability of in person appointments. I also discussed with the patient that there may be a patient responsible charge related to this service. The patient expressed understanding and agreed to proceed.    I connected with Timothy [name] on 11/07/18  at  11:18 AM EDT  EDT by telephone and verified that I am speaking with the correct person using two identifiers.   Consent I discussed the limitations, risks, security and privacy concerns of performing an evaluation and management service by telephone and the availability of in person appointments. I also discussed with the patient that there may be a patient responsible charge related to this service. The patient expressed understanding and agreed to proceed.   Location of Patient: Private Residence   Location of Provider: [place] participating in Telemedicine visit: Geryl Rankins FNP-BC [place]    History of Present Illness: Telemedicine visit for: Follow up for HTN    Essential Hypertension Has not been checking his blood pressure. Denies chest pain, shortness of breath, palpitations, lightheadedness, dizziness, headaches or BLE edema. Also states he stopped taking HCTZ 19m a few months ago. States "I didn't feel like it was doing anything". He could not understand how a diuretic could reduce BP. We had a long discussion regarding thiazides and thiazide like diuretics and how they assist with BP management. He verbalized understanding. He was instructed to take lorsartan 501BPand bystolic 110CHas prescribed. I will start him on chlorthalidone 28mas well. He was  instructed to start monitoring his blood pressure again at home as he has a blood pressure machine. He was given BP parameters and instructions of when to call the office.  BP Readings from Last 3 Encounters:  08/05/18 (!) 160/100  06/30/18 (!) 160/94  05/04/18 138/90    Past Medical History:  Diagnosis Date  . Autoimmune hepatitis ([place]  . Cir[place]osis of liver ([place]  . Hypertension   . Thrombocytopenic ([place]    History reviewed. No pertinent surgical history.  Family History  Problem Relation Age of Onset  . Hypertension Sister     Social History   Socioeconomic History  . Marital status: Single    Spouse name: Not on file  . Number of children: Not on file  . Years of education: Not on file  . Highest education level: Not on file  Occupational History  . Not on file  Social Needs  . Financial resource strain: Not on file  . Food insecurity:    Worry: Not on file    Inability: Not on file  . Transportation needs:    Medical: Not on file    Non-medical: Not on file  Tobacco Use  . Smoking status: Never Smoker  . Smokeless tobacco: Never Used  Substance and Sexual Activity  . Alcohol use: No    Alcohol/week: 0.0 standard drinks  . Drug use: No  . Sexual activity: Yes  Lifestyle  . Physical activity:    Days per week: Not on file    Minutes per session: Not on file  . Stress: Not on file  Relationships  . Social connections:  Talks on phone: Not on file    Gets together: Not on file    Attends religious service: Not on file    Active member of club or organization: Not on file    Attends meetings of clubs or organizations: Not on file    Relationship status: Not on file  Other Topics Concern  . Not on file  Social History Narrative  . Not on file     Observations/Objective: Awake, alert and oriented x 3   Review of Systems  Constitutional: Negative for fever, malaise/fatigue and weight loss.  HENT: Negative.  Negative for nosebleeds.   Eyes:  Negative.  Negative for blurred vision, double vision and photophobia.  Respiratory: Negative.  Negative for cough and shortness of breath.   Cardiovascular: Positive for leg swelling (right leg swelling "when brace is too tight"). Negative for chest pain and palpitations.  Gastrointestinal: Negative.  Negative for heartburn, nausea and vomiting.  Musculoskeletal: Negative.  Negative for myalgias.  Neurological: Negative.  Negative for dizziness, focal weakness, seizures and headaches.  Psychiatric/Behavioral: Negative.  Negative for suicidal ideas.    Assessment and Plan:  Diagnoses and all orders for this visit:  Essential hypertension -     chlorthalidone (HYGROTON) 25 MG tablet; Take 1 tablet (25 mg total) by mouth daily for 30 days. -     CMP14+EGFR -     Lipid panel Continue all antihypertensives as prescribed.  Remember to bring in your blood pressure log with you for your follow up appointment.  DASH/Mediterranean Diets are healthier choices for HTN.    Cirrhosis of liver without ascites, unspecified hepatic cirrhosis type ([place]) CMP pending Instructed to avoid alcohol, acetominophen  Thrombocytopenia (HCC) -     CBC Instructed to avoid NSAIDs     Follow Up Instructions Return in about 4 weeks (around 12/05/2018) for BP recheck.     I discussed the assessment and treatment plan with the patient. The patient was provided an opportunity to ask questions and all were answered. The patient agreed with the plan and demonstrated an understanding of the instructions.   The patient was advised to call back or seek an in-person evaluation if the symptoms worsen or if the condition fails to improve as anticipated.  I provided 26 minutes of non-face-to-face time during this encounter including median intraservice time, reviewing previous notes, labs, imaging, medications and explaining diagnosis and management.  Gildardo Pounds, FNP-BC

## 2018-11-08 ENCOUNTER — Telehealth: Payer: Self-pay | Admitting: Nurse Practitioner

## 2018-11-08 NOTE — Telephone Encounter (Signed)
Please let patient know I will be documenting in his chart that he declines additional BP medication despite his poorly controlled HTN. Thank you

## 2018-11-08 NOTE — Telephone Encounter (Signed)
Patient called stating he was taking three medications  Which includes the hydro medication (patient is unsure of the name) when he went to the pharmacy to get his prescription patient states he was given another pill he didn't recognize and was unsure what it was for. Please follow up.

## 2018-11-08 NOTE — Telephone Encounter (Signed)
CMA informed patient PCP started patient on Chlorthalidone 25mg .   Pt. Stated he do not want to take the Chlorthalidone but only want to take Losartan and Bystolic for his BP because he is feeling fine with the two medication he currently is taking with no headaches.

## 2018-11-09 ENCOUNTER — Other Ambulatory Visit: Payer: Self-pay

## 2019-01-04 MED FILL — LOSARTAN POTASSIUM 50 MG TA: 50 | 90 days supply | Qty: 90 | Fill #2

## 2019-01-04 MED FILL — ?CHLORTHALIDONE 25 MG TABLE: 25 | 30 days supply | Qty: 30 | Fill #1

## 2019-01-04 MED FILL — $BYSTOLIC 10 MG TABLET: 10 | 90 days supply | Qty: 90 | Fill #0

## 2019-01-10 IMAGING — DX DG FOOT COMPLETE 3+V*R*
3 series · 3 of 3 positions shown · non-contrast
Comparison: None.

CLINICAL DATA: Pain following fall

EXAM:
RIGHT FOOT COMPLETE - 3+ VIEW

[foot ap]
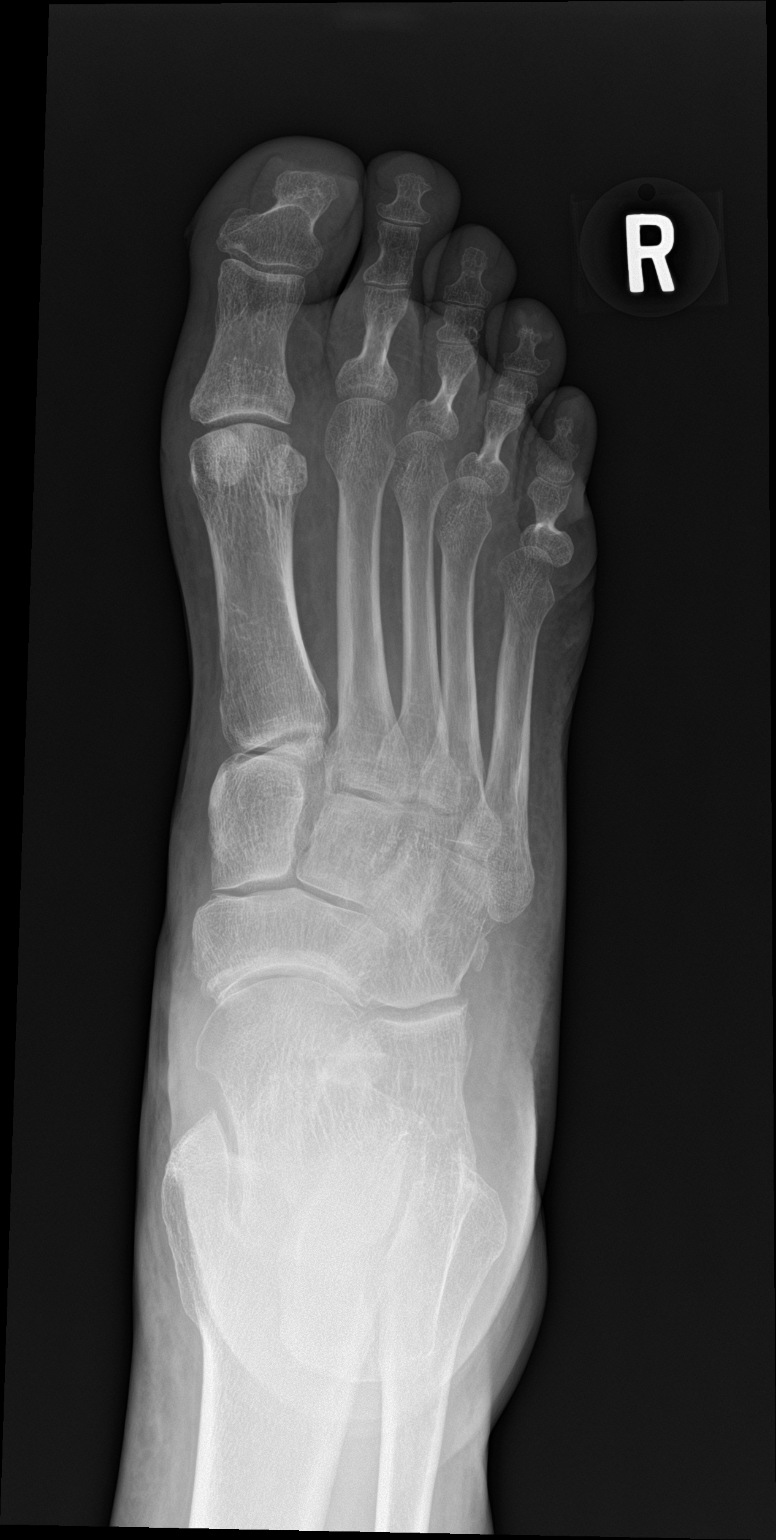

[foot obl]
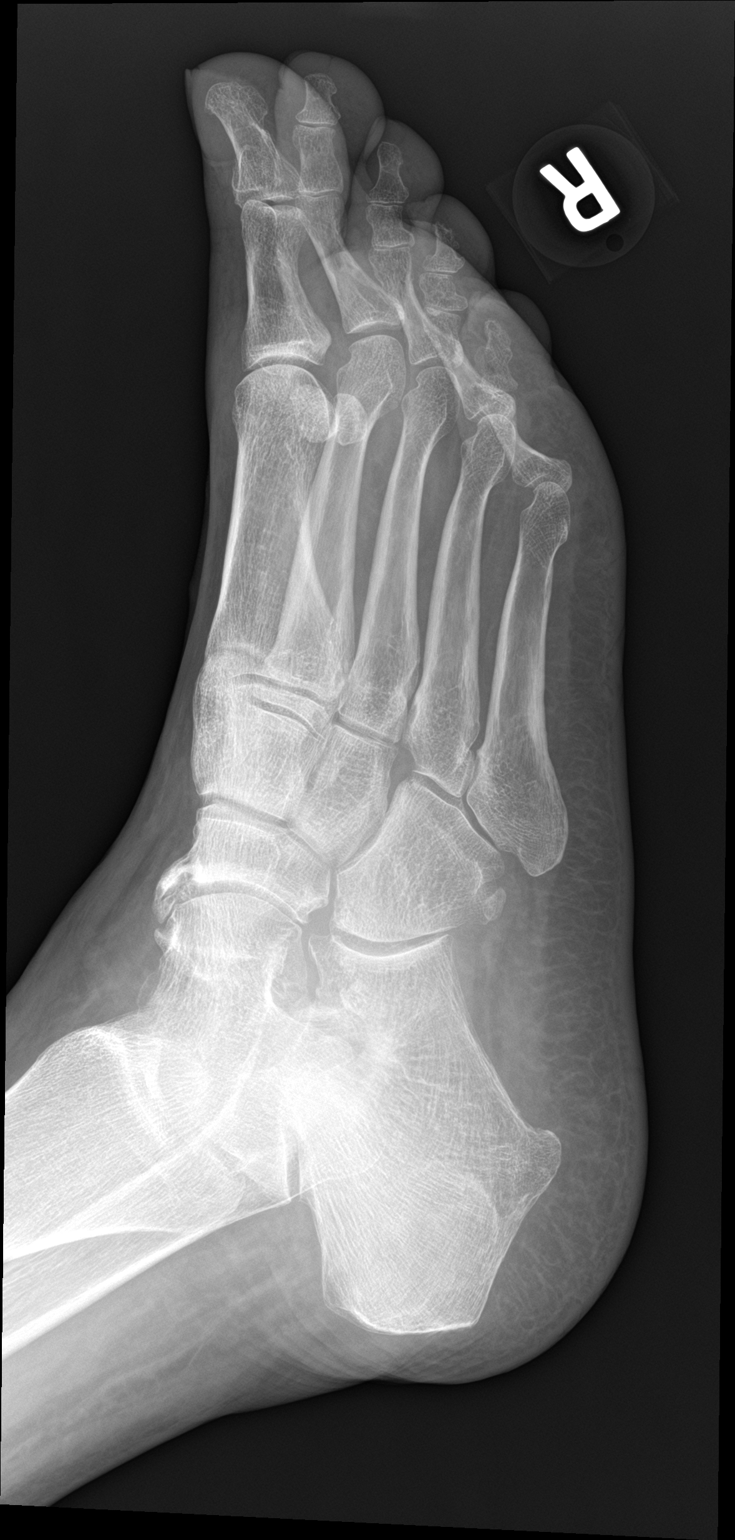

[foot lat]
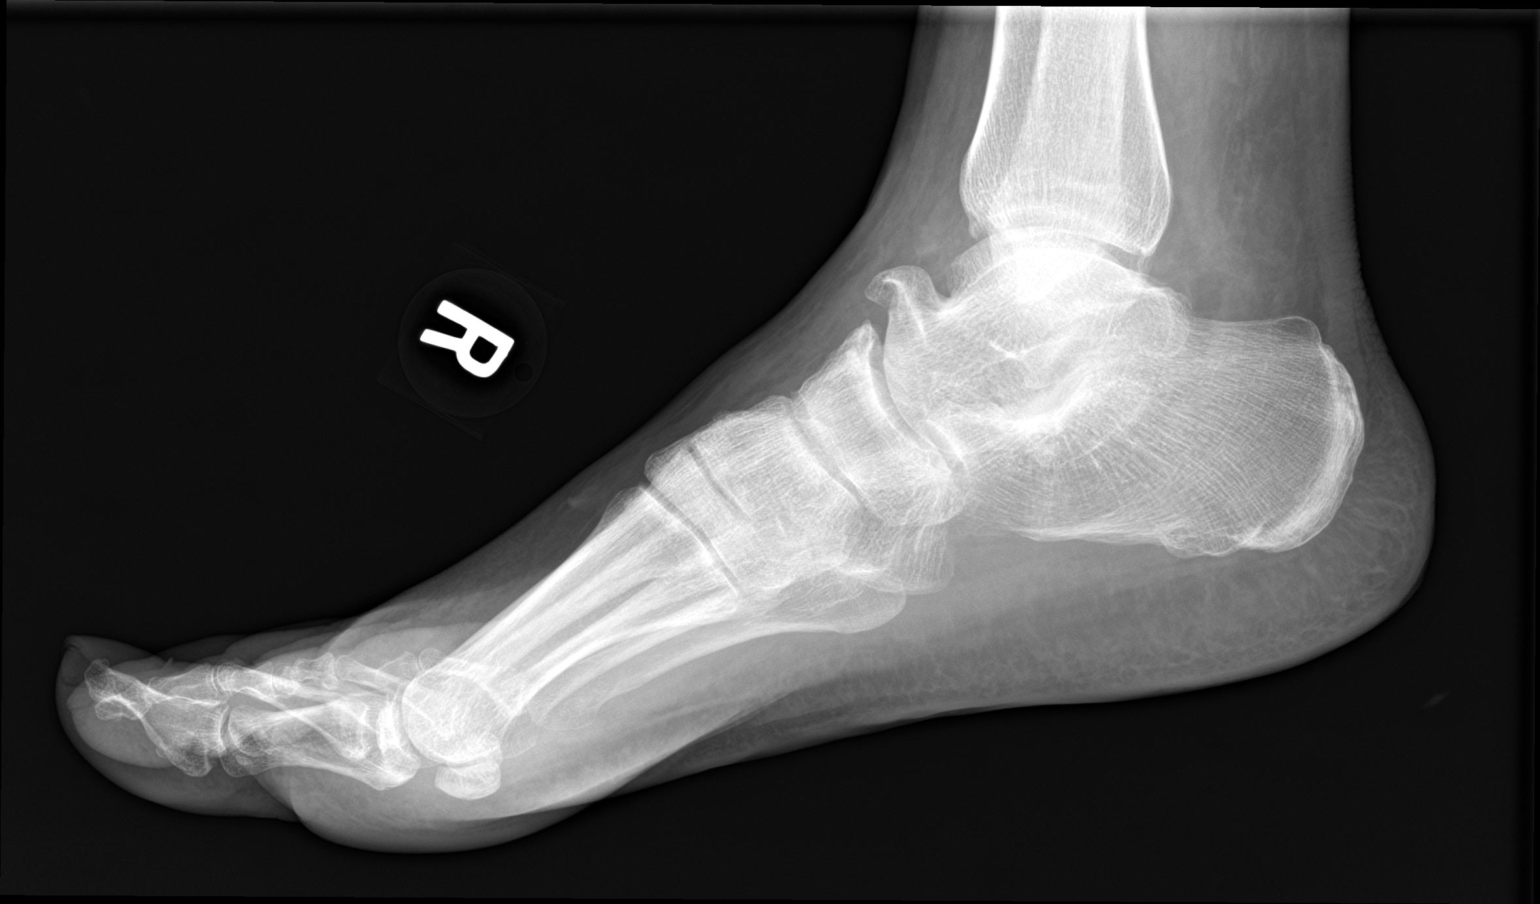

[3 of 3 positions shown; findings below may reference images not displayed]

FINDINGS: Frontal, oblique, and lateral views were obtained. There is no
appreciable fracture or dislocation. The joint spaces appear normal.
No erosive change. There is spurring in the dorsal talonavicular
joint with a benign exostosis along the dorsal distal talus.
IMPRESSION: Spurring arising from the dorsal distal talus with spurring in the
talonavicular joint. No fracture or dislocation. Other joint spaces
appear unremarkable.

## 2019-01-10 IMAGING — DX DG ANKLE COMPLETE 3+V*R*
3 series · 3 of 3 positions shown · non-contrast
Comparison: 03/04/2005

CLINICAL DATA: Recent fall from ladder with ankle pain, initial
encounter

EXAM:
RIGHT ANKLE - COMPLETE 3+ VIEW

[ankle ap]
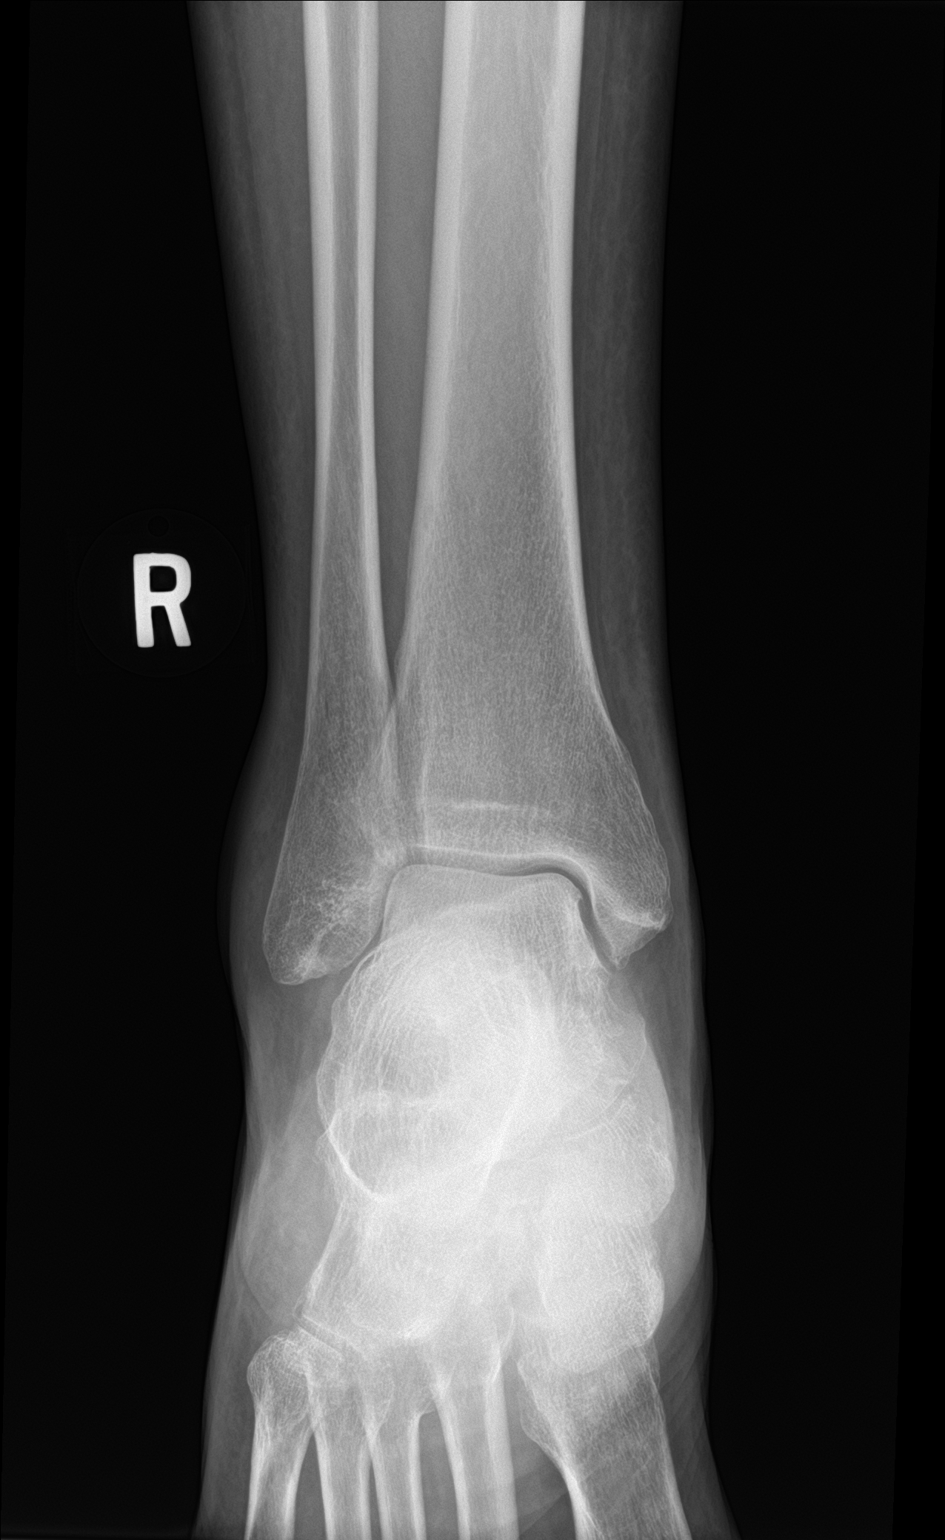

[ankle obl]
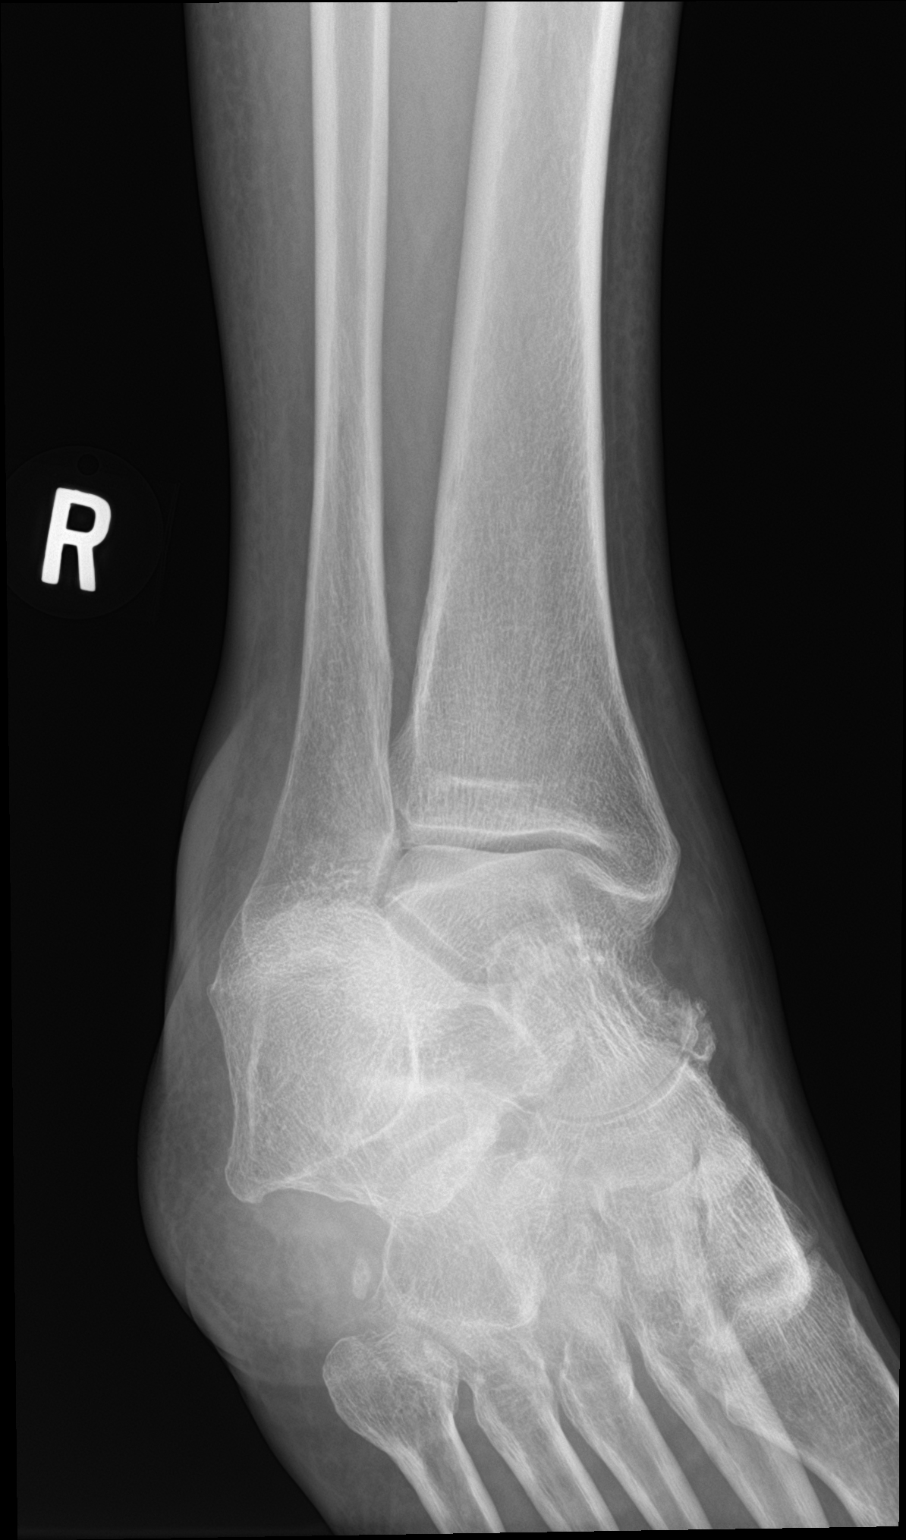

[ankle lat]
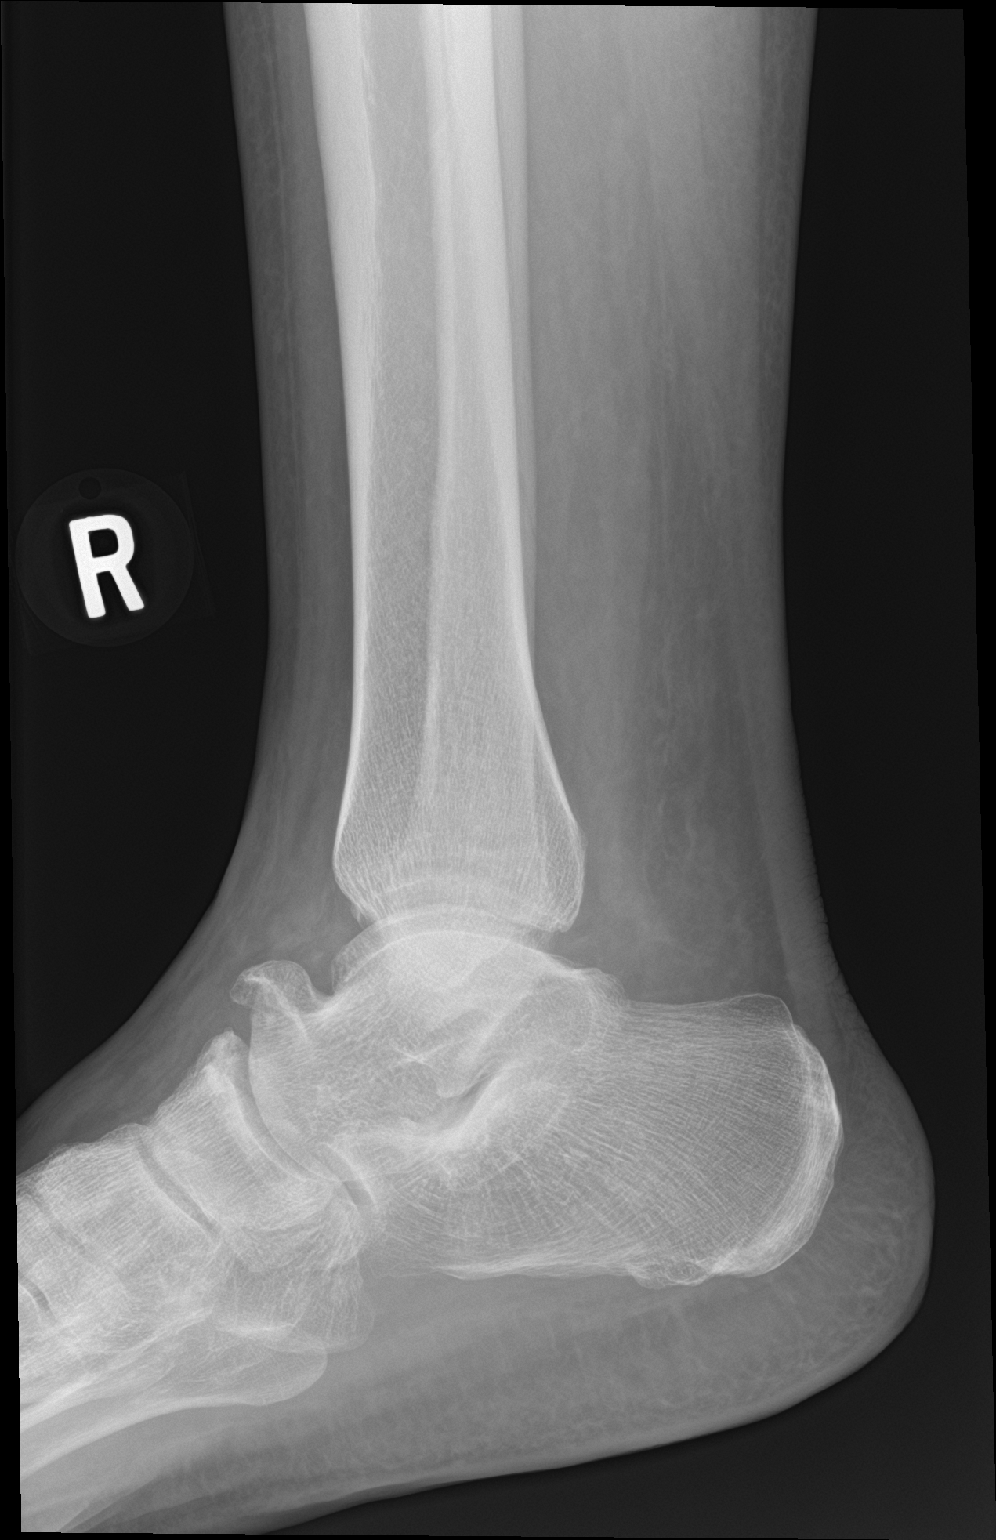

[3 of 3 positions shown; findings below may reference images not displayed]

FINDINGS: Lateral soft tissue swelling is noted. Degenerative changes in the
tarsal bones are seen. No soft tissue abnormality is seen. No acute
fracture is noted.
IMPRESSION: Lateral soft tissue swelling.  No acute fracture is noted.

## 2019-03-20 ENCOUNTER — Other Ambulatory Visit: Payer: Self-pay | Admitting: Nurse Practitioner

## 2019-03-20 DIAGNOSIS — I10 Essential (primary) hypertension: Secondary | ICD-10-CM

## 2019-03-20 MED FILL — ?CHLORTHALIDONE 25 MG TABLE: 25 | 30 days supply | Qty: 30 | Fill #0

## 2019-03-27 MED FILL — $BYSTOLIC 10 MG TABLET: 10 | 90 days supply | Qty: 90 | Fill #1

## 2019-05-24 ENCOUNTER — Other Ambulatory Visit: Payer: Self-pay | Admitting: Family Medicine

## 2019-05-24 DIAGNOSIS — I10 Essential (primary) hypertension: Secondary | ICD-10-CM

## 2019-05-24 MED FILL — FLUTICASONE PROP 50 MCG SPR: 50 | 30 days supply | Qty: 16 | Fill #2

## 2019-07-17 ENCOUNTER — Other Ambulatory Visit: Payer: Self-pay

## 2019-07-17 ENCOUNTER — Encounter: Payer: Self-pay | Admitting: Nurse Practitioner

## 2019-07-17 ENCOUNTER — Ambulatory Visit: Payer: Self-pay | Attending: Nurse Practitioner | Admitting: Nurse Practitioner

## 2019-07-17 VITALS — BP 172/100 | HR 61 | Temp 98.4°F | Ht 73.25 in | Wt 238.0 lb

## 2019-07-17 DIAGNOSIS — Z131 Encounter for screening for diabetes mellitus: Secondary | ICD-10-CM

## 2019-07-17 DIAGNOSIS — Z125 Encounter for screening for malignant neoplasm of prostate: Secondary | ICD-10-CM

## 2019-07-17 DIAGNOSIS — D696 Thrombocytopenia, unspecified: Secondary | ICD-10-CM

## 2019-07-17 DIAGNOSIS — Z1211 Encounter for screening for malignant neoplasm of colon: Secondary | ICD-10-CM

## 2019-07-17 DIAGNOSIS — I10 Essential (primary) hypertension: Secondary | ICD-10-CM

## 2019-07-17 DIAGNOSIS — Z13220 Encounter for screening for lipoid disorders: Secondary | ICD-10-CM

## 2019-07-17 MED ORDER — NEBIVOLOL HCL 10 MG PO TABS: 10.0000 mg | ORAL_TABLET | Freq: Every day | ORAL | 3 refills | Status: DC

## 2019-07-17 MED ORDER — LOSARTAN POTASSIUM 50 MG PO TABS: 50.0000 mg | ORAL_TABLET | Freq: Every day | ORAL | 2 refills | Status: DC

## 2019-07-17 MED ORDER — CHLORTHALIDONE 25 MG PO TABS
25.0000 mg | ORAL_TABLET | Freq: Every day | ORAL | 1 refills | Status: DC
Start: 2019-07-17 — End: 2019-10-25

## 2019-07-17 MED FILL — $BYSTOLIC 10 MG TABLET: 10 | 90 days supply | Qty: 90 | Fill #0

## 2019-07-17 MED FILL — LOSARTAN POTASSIUM 50 MG TA: 50 | 30 days supply | Qty: 30 | Fill #0

## 2019-07-17 MED FILL — ?CHLORTHALIDONE 25MG TABL: 25 | 30 days supply | Qty: 30 | Fill #0

## 2019-07-17 NOTE — Progress Notes (Signed)
Assessment & Plan:  Timothy [name] was seen today for follow-up.  Diagnoses and all orders for this visit:  Essential hypertension -     chlorthalidone (HYGROTON) 25 MG tablet; Take 1 tablet (25 mg total) by mouth daily. -     losartan (COZAAR) 50 MG tablet; Take 1 tablet (50 mg total) by mouth daily. -     nebivolol (BYSTOLIC) 10 MG tablet; Take 1 tablet (10 mg total) by mouth daily. -     CMP14+EGFR  Colon cancer screening -     Fecal occult blood, imunochemical([facility])  Prostate cancer screening -     PSA  Thrombocytopenia (HCC) -     CBC  Lipid screening -     Lipid Panel  Encounter for screening for diabetes mellitus -     A1c    Patient has been counseled on age-appropriate routine health concerns for screening and prevention. These are reviewed and up-to-date. Referrals have been placed accordingly. Immunizations are up-to-date or declined.    Subjective:   Chief Complaint  Patient presents with  . Follow-up    Pt. is here for a follow up on hypertension.    HPI Timothy [name] 59 y.o. male presents to office today for follow up.  has a past medical history of Autoimmune hepatitis ([place]), Cirrhosis of liver ([place]), Hypertension, and Thrombocytopenic ([place]).  ESSENTIAL HYPERTENSION Blood pressure is significantly elevated today and patient reports he has not picked up his losartan and he ran out of chlorthalidone a few months ago.  I will refill all 3 of his antihypertensives and have her return back in 2 to 3 weeks for blood pressure check. Currently only taking Bystolic 10 mg daily. Denies chest pain, shortness of breath, palpitations, lightheadedness, dizziness, headaches or BLE edema.  Weight is up 14 pounds since last January.  He is not diet adherent in regard to [place]. BP Readings from Last 3 Encounters:  07/17/19 (!) 172/100  08/05/18 (!) 160/100  06/30/18 (!) 160/94   Review of Systems  Constitutional: Negative for fever, malaise/fatigue and weight  loss.  HENT: Negative.  Negative for nosebleeds.   Eyes: Negative.  Negative for blurred vision, double vision and photophobia.  Respiratory: Negative.  Negative for cough and shortness of breath.   Cardiovascular: Negative.  Negative for chest pain, palpitations and leg swelling.  Gastrointestinal: Negative.  Negative for heartburn, nausea and vomiting.  Musculoskeletal: Negative.  Negative for myalgias.  Neurological: Negative.  Negative for dizziness, focal weakness, seizures and headaches.  Psychiatric/Behavioral: Negative.  Negative for suicidal ideas.    Past Medical History:  Diagnosis Date  . Autoimmune hepatitis ([place])   . Cirrhosis of liver ([place])   . Hypertension   . Thrombocytopenic ([place])     History reviewed. No pertinent surgical history.  Family History  Problem Relation Age of Onset  . Hypertension Sister     Social History Reviewed with no changes to be made today.   Outpatient Medications Prior to Visit  Medication Sig Dispense Refill  . fluticasone (FLONASE) 50 MCG/ACT nasal spray Place 2 sprays into both nostrils daily. 16 g 6  . nebivolol (BYSTOLIC) 10 MG tablet Take 1 tablet (10 mg total) by mouth daily. 90 tablet 3  . chlorthalidone (HYGROTON) 25 MG tablet Take 1 tablet (25 mg total) by mouth daily. Must have office visit for refills 30 tablet 0  . losartan (COZAAR) 50 MG tablet Take 1 tablet (50 mg total) by mouth daily. 90 tablet 2  No facility-administered medications prior to visit.    Allergies  Allergen Reactions  . Nsaids     thrombocytopenia       Objective:    BP (!) 172/100 (BP Location: Left Arm, Patient Position: Sitting, Cuff Size: Normal)   Pulse 61   Temp 98.4 F (36.9 C) (Oral)   Ht 6' 1.25" (1.861 m)   Wt 238 lb (108 kg)   SpO2 97%   BMI 31.19 kg/m  Wt Readings from Last 3 Encounters:  07/17/19 238 lb (108 kg)  08/05/18 224 lb 6.4 oz (101.8 kg)  06/30/18 223 lb (101.2 kg)    Physical Exam Vitals and nursing note  reviewed.  Constitutional:      Appearance: He is well-developed.  HENT:     Head: Normocephalic and atraumatic.  Cardiovascular:     Rate and Rhythm: Normal rate and regular rhythm.     Heart sounds: Normal heart sounds. No murmur. No friction rub. No gallop.   Pulmonary:     Effort: Pulmonary effort is normal. No tachypnea or respiratory distress.     Breath sounds: Normal breath sounds. No decreased breath sounds, wheezing, rhonchi or rales.  Chest:     Chest wall: No tenderness.  Abdominal:     General: Bowel sounds are normal.     Palpations: Abdomen is soft.  Musculoskeletal:        General: Normal range of motion.     Cervical back: Normal range of motion.  Skin:    General: Skin is warm and dry.  Neurological:     Mental Status: He is alert and oriented to person, place, and time.     Coordination: Coordination normal.  Psychiatric:        Behavior: Behavior normal. Behavior is cooperative.        Thought Content: Thought content normal.        Judgment: Judgment normal.          Patient has been counseled extensively about nutrition and exercise as well as the importance of adherence with medications and regular follow-up. The patient was given clear instructions to go to ER or return to medical center if symptoms don't improve, worsen or new problems develop. The patient verbalized understanding.   Follow-up: Return for 3 WEEKS bp CHECK luke THEN SEE ME IN 2 3 MONTHSPLEASE GIVE FA PACKET AND EXPLAIN PROCESS.   Gildardo Pounds, FNP-BC [facility] [place], [place]   07/17/2019, 1:54 PM

## 2019-07-18 ENCOUNTER — Other Ambulatory Visit: Payer: Self-pay | Admitting: Nurse Practitioner

## 2019-07-18 DIAGNOSIS — K754 Autoimmune hepatitis: Secondary | ICD-10-CM

## 2019-07-18 DIAGNOSIS — D696 Thrombocytopenia, unspecified: Secondary | ICD-10-CM

## 2019-07-18 DIAGNOSIS — K7030 Alcoholic cirrhosis of liver without ascites: Secondary | ICD-10-CM

## 2019-07-18 LAB — CMP14+EGFR
ALT: 35 IU/L (ref 0–44)
AST: 61 IU/L — ABNORMAL HIGH (ref 0–40)
Albumin/Globulin Ratio: 0.9 — ABNORMAL LOW (ref 1.2–2.2)
Albumin: 3.6 g/dL — ABNORMAL LOW (ref 3.8–4.9)
Alkaline Phosphatase: 89 IU/L (ref 39–117)
BUN/Creatinine Ratio: 13 (ref 9–20)
BUN: 15 mg/dL (ref 6–24)
Bilirubin Total: 0.8 mg/dL (ref 0.0–1.2)
CO2: 23 mmol/L (ref 20–29)
Calcium: 9.1 mg/dL (ref 8.7–10.2)
Chloride: 108 mmol/L — ABNORMAL HIGH (ref 96–106)
Creatinine, Ser: 1.19 mg/dL (ref 0.76–1.27)
GFR calc Af Amer: 77 mL/min/{1.73_m2} (ref >59)
GFR calc non Af Amer: 66 mL/min/{1.73_m2} (ref >59)
Globulin, Total: 4.1 g/dL (ref 1.5–4.5)
Glucose: 82 mg/dL (ref 65–99)
Potassium: 4.1 mmol/L (ref 3.5–5.2)
Sodium: 143 mmol/L (ref 134–144)
Total Protein: 7.7 g/dL (ref 6.0–8.5)

## 2019-07-18 LAB — CBC
Hematocrit: 43.1 % (ref 37.5–51.0)
Hemoglobin: 14.5 g/dL (ref 13.0–17.7)
MCH: 28.8 pg (ref 26.6–33.0)
MCHC: 33.6 g/dL (ref 31.5–35.7)
MCV: 86 fL (ref 79–97)
Platelets: 62 10*3/uL — CL (ref 150–450)
RBC: 5.04 x10E6/uL (ref 4.14–5.80)
RDW: 13.8 % (ref 11.6–15.4)
WBC: 4.6 10*3/uL (ref 3.4–10.8)

## 2019-07-18 LAB — LIPID PANEL
Chol/HDL Ratio: 2.7 ratio (ref 0.0–5.0)
Cholesterol, Total: 155 mg/dL (ref 100–199)
HDL: 58 mg/dL (ref >39)
LDL Chol Calc (NIH): 83 mg/dL (ref 0–99)
Triglycerides: 73 mg/dL (ref 0–149)
VLDL Cholesterol Cal: 14 mg/dL (ref 5–40)

## 2019-07-18 LAB — PSA: Prostate Specific Ag, Serum: 0.6 ng/mL (ref 0.0–4.0)

## 2019-07-18 LAB — HEMOGLOBIN A1C
Est. average glucose Bld gHb Est-mCnc: 120 mg/dL
Hgb A1c MFr Bld: 5.8 % — ABNORMAL HIGH (ref 4.8–5.6)

## 2019-07-22 LAB — FECAL OCCULT BLOOD, IMMUNOCHEMICAL: Fecal Occult Bld: NEGATIVE

## 2019-08-07 ENCOUNTER — Ambulatory Visit: Payer: Self-pay | Attending: Nurse Practitioner | Admitting: Pharmacist

## 2019-08-07 ENCOUNTER — Other Ambulatory Visit: Payer: Self-pay

## 2019-08-07 VITALS — BP 161/96

## 2019-08-07 DIAGNOSIS — I10 Essential (primary) hypertension: Secondary | ICD-10-CM

## 2019-08-07 MED ORDER — AMLODIPINE BESYLATE 5 MG PO TABS: 5.0000 mg | ORAL_TABLET | Freq: Every day | ORAL | 2 refills | Status: DC

## 2019-08-07 MED FILL — AMLODIPINE BESYLATE 5 MG TA: 5 | 30 days supply | Qty: 30 | Fill #0

## 2019-08-07 NOTE — Progress Notes (Signed)
   S:    PCP: Timothy [name]   Patient arrives in good spirits. Presents to the clinic for hypertension evaluation, counseling, and management.  Patient was referred and last seen by Primary Care Provider on 07/17/19.    Patient denies adherence with medications. He reports compliance with Bystolic and losartan. Denies adherence with chlorthalidone.   Current BP Medications include:  Chlorthalidone 25 mg daily, losartan 50 mg daily, Bystolic 10 mg daily  Dietary habits include: reports compliance with salt restriction; denies drinking caffeine  Exercise habits include: denies; plans to increase exercise Family / Social history:  - FHx: HTN - Tobacco: never smoker - Alcohol: denies use   O:  Vitals:   08/07/19 1504  BP: (!) 161/96   Home BP readings:  - Brings meter - SBPs: 160s - DBPs: 80s-100s   Last 3 Office BP readings: BP Readings from Last 3 Encounters:  08/07/19 (!) 161/96  07/17/19 (!) 172/100  08/05/18 (!) 160/100    BMET    Component Value Date/Time   NA 143 07/17/2019 1357   NA 141 01/27/2016 1005   K 4.1 07/17/2019 1357   K 4.1 01/27/2016 1005   CL 108 (H) 07/17/2019 1357   CO2 23 07/17/2019 1357   CO2 24 01/27/2016 1005   GLUCOSE 82 07/17/2019 1357   GLUCOSE 98 01/27/2016 1005   BUN 15 07/17/2019 1357   BUN 15.0 01/27/2016 1005   CREATININE 1.19 07/17/2019 1357   CREATININE 1.2 01/27/2016 1005   CALCIUM 9.1 07/17/2019 1357   CALCIUM 8.8 01/27/2016 1005   GFRNONAA 66 07/17/2019 1357   GFRAA 77 07/17/2019 1357    Renal function: CrCl cannot be calculated (Patient's most recent lab result is older than the maximum 21 days allowed.).  Clinical ASCVD: No  The 10-year ASCVD risk score Mikey Bussing DC Jr., et al., 2013) is: 17.3%   Values used to calculate the score:     Age: 59 years     Sex: Male     Is Non-Hispanic African American: Yes     Diabetic: No     Tobacco smoker: No     Systolic Blood Pressure: [id number] mmHg     Is BP treated: Yes     HDL Cholesterol:  58 mg/dL     Total Cholesterol: 155 mg/dL  A/P: Hypertension longstanding currently uncontrolled on current medications. BP Goal = <130/80 mmHg. Patient is not fully adherent with medications. Emphasized need for compliance with all medications, including chlorthalidone. Will add low dose Norvasc. He has experienced LE edema in the past on the 10 mg dose,  -Started amlodipine 5 mg daily.  -Counseled on lifestyle modifications for blood pressure control including reduced dietary sodium, increased exercise, adequate sleep  Results reviewed and written information provided.   Total time in face-to-face counseling 15 minutes.   F/U Clinic Visit in 3 weeks.  Benard Halsted, PharmD, [place] [phone]

## 2019-08-14 MED FILL — ?CHLORTHALIDONE 25MG TABL: 25 | 30 days supply | Qty: 30 | Fill #1

## 2019-08-14 MED FILL — LOSARTAN POTASSIUM 50 MG TA: 50 | 30 days supply | Qty: 30 | Fill #1

## 2019-08-28 ENCOUNTER — Ambulatory Visit: Payer: Self-pay | Attending: Nurse Practitioner | Admitting: Pharmacist

## 2019-08-28 ENCOUNTER — Other Ambulatory Visit: Payer: Self-pay

## 2019-08-28 VITALS — BP 138/82 | HR 65

## 2019-08-28 DIAGNOSIS — I10 Essential (primary) hypertension: Secondary | ICD-10-CM

## 2019-08-28 NOTE — Progress Notes (Signed)
   S:    PCP: Zelda   Patient arrives in good spirits. Presents to the clinic for hypertension evaluation, counseling, and management.  Patient was referred and last seen by Primary Care Provider on 07/17/19.    Patient denies adherence with medications. Stopped amlodipine d/t swelling ~2 days after taking.   Current BP Medications include:  Amlodipine 5 mg daily (not taking), Chlorthalidone 25 mg daily, losartan 50 mg daily, Bystolic 10 mg daily  Dietary habits include: reports compliance with salt restriction; denies drinking caffeine  Exercise habits include: denies; plans to increase exercise Family / Social history:  - FHx: HTN - Tobacco: never smoker - Alcohol: denies use   O:  Vitals:   08/28/19 1444  BP: 138/82  Pulse: 65   Home BP readings:  - Did not bring meter; checked once since last seeing me -Gives SBP of 153  Last 3 Office BP readings: BP Readings from Last 3 Encounters:  08/28/19 138/82  08/07/19 (!) 161/96  07/17/19 (!) 172/100    BMET    Component Value Date/Time   NA 143 07/17/2019 1357   NA 141 01/27/2016 1005   K 4.1 07/17/2019 1357   K 4.1 01/27/2016 1005   CL 108 (H) 07/17/2019 1357   CO2 23 07/17/2019 1357   CO2 24 01/27/2016 1005   GLUCOSE 82 07/17/2019 1357   GLUCOSE 98 01/27/2016 1005   BUN 15 07/17/2019 1357   BUN 15.0 01/27/2016 1005   CREATININE 1.19 07/17/2019 1357   CREATININE 1.2 01/27/2016 1005   CALCIUM 9.1 07/17/2019 1357   CALCIUM 8.8 01/27/2016 1005   GFRNONAA 66 07/17/2019 1357   GFRAA 77 07/17/2019 1357    Renal function: CrCl cannot be calculated (Patient's most recent lab result is older than the maximum 21 days allowed.).  Clinical ASCVD: No  The 10-year ASCVD risk score Mikey Bussing DC Jr., et al., 2013) is: 13.2%   Values used to calculate the score:     Age: 59 years     Sex: Male     Is Non-Hispanic African American: Yes     Diabetic: No     Tobacco smoker: No     Systolic Blood Pressure: 0000000 mmHg     Is BP  treated: Yes     HDL Cholesterol: 58 mg/dL     Total Cholesterol: 155 mg/dL  A/P: Hypertension longstanding currently uncontrolled on current medications. BP Goal = <130/80 mmHg. Patient is not adherent with amlodipine, but his BP is improved. Will stop amlodipine and continue all other medications.  -Stop amlodipine 5 mg daily.  -Continue other medications.  -Counseled on lifestyle modifications for blood pressure control including reduced dietary sodium, increased exercise, adequate sleep  Results reviewed and written information provided.   Total time in face-to-face counseling 15 minutes.   F/U Clinic Visit in 1 month.  Benard Halsted, PharmD, [place] [phone]

## 2019-09-14 MED FILL — LOSARTAN POTASSIUM 50 MG TA: 50 | 30 days supply | Qty: 30 | Fill #2

## 2019-09-25 ENCOUNTER — Ambulatory Visit: Payer: Self-pay | Attending: Nurse Practitioner | Admitting: Pharmacist

## 2019-09-25 ENCOUNTER — Other Ambulatory Visit: Payer: Self-pay

## 2019-09-25 VITALS — BP 114/78 | HR 77

## 2019-09-25 DIAGNOSIS — I10 Essential (primary) hypertension: Secondary | ICD-10-CM

## 2019-09-25 NOTE — Progress Notes (Signed)
   S:    PCP: Zelda   Patient arrives in good spirits. Presents to the clinic for hypertension evaluation, counseling, and management.  Patient was referred and last seen by Primary Care Provider on 07/17/19.    Patient reports adherence with medications.  Current BP Medications include: Chlorthalidone 25 mg daily, losartan 50 mg daily, Bystolic 10 mg daily  Dietary habits include: reports compliance with salt restriction; denies drinking caffeine  Exercise habits include: denies; plans to increase exercise Family / Social history:  - FHx: HTN - Tobacco: never smoker - Alcohol: denies use   O:  Vitals:   09/25/19 1454  BP: 114/78  Pulse: 77     Home BP readings:  - SBPs 127 -  135 reported   Last 3 Office BP readings: BP Readings from Last 3 Encounters:  09/25/19 114/78  08/28/19 138/82  08/07/19 (!) 161/96    BMET    Component Value Date/Time   NA 143 07/17/2019 1357   NA 141 01/27/2016 1005   K 4.1 07/17/2019 1357   K 4.1 01/27/2016 1005   CL 108 (H) 07/17/2019 1357   CO2 23 07/17/2019 1357   CO2 24 01/27/2016 1005   GLUCOSE 82 07/17/2019 1357   GLUCOSE 98 01/27/2016 1005   BUN 15 07/17/2019 1357   BUN 15.0 01/27/2016 1005   CREATININE 1.19 07/17/2019 1357   CREATININE 1.2 01/27/2016 1005   CALCIUM 9.1 07/17/2019 1357   CALCIUM 8.8 01/27/2016 1005   GFRNONAA 66 07/17/2019 1357   GFRAA 77 07/17/2019 1357    Renal function: CrCl cannot be calculated (Patient's most recent lab result is older than the maximum 21 days allowed.).  Clinical ASCVD: No  The 10-year ASCVD risk score Mikey Bussing DC Jr., et al., 2013) is: 9.3%   Values used to calculate the score:     Age: 59 years     Sex: Male     Is Non-Hispanic African American: Yes     Diabetic: No     Tobacco smoker: No     Systolic Blood Pressure: 99991111 mmHg     Is BP treated: Yes     HDL Cholesterol: 58 mg/dL     Total Cholesterol: 155 mg/dL  A/P: Hypertension longstanding currently at goal on current  medications. BP Goal = <130/80 mmHg. Patient is adherent to medication.  -Continue other medications.  -Counseled on lifestyle modifications for blood pressure control including reduced dietary sodium, increased exercise, adequate sleep  Results reviewed and written information provided.   Total time in face-to-face counseling 15 minutes.   F/U Clinic Visit next month with PCP.   Patient seen by:  Lorel Monaco, PharmD PGY1 Ambulatory Care Resident [place], PharmD, [place] [phone]

## 2019-10-16 ENCOUNTER — Other Ambulatory Visit: Payer: Self-pay

## 2019-10-16 ENCOUNTER — Ambulatory Visit: Payer: Self-pay | Attending: Nurse Practitioner | Admitting: Nurse Practitioner

## 2019-10-16 MED FILL — LOSARTAN POTASSIUM 50 MG TA: 50 | 30 days supply | Qty: 30 | Fill #3

## 2019-10-25 ENCOUNTER — Encounter: Payer: Self-pay | Admitting: Nurse Practitioner

## 2019-10-25 ENCOUNTER — Ambulatory Visit: Payer: Self-pay | Attending: Nurse Practitioner | Admitting: Nurse Practitioner

## 2019-10-25 ENCOUNTER — Other Ambulatory Visit: Payer: Self-pay

## 2019-10-25 DIAGNOSIS — R7303 Prediabetes: Secondary | ICD-10-CM

## 2019-10-25 DIAGNOSIS — K7460 Unspecified cirrhosis of liver: Secondary | ICD-10-CM

## 2019-10-25 DIAGNOSIS — D696 Thrombocytopenia, unspecified: Secondary | ICD-10-CM

## 2019-10-25 DIAGNOSIS — I10 Essential (primary) hypertension: Secondary | ICD-10-CM

## 2019-10-25 MED ORDER — CHLORTHALIDONE 25 MG PO TABS: 25.0000 mg | ORAL_TABLET | Freq: Every day | ORAL | 1 refills | Status: DC

## 2019-10-25 MED FILL — ?CHLORTHALIDONE 25MG TABL: 25 | 30 days supply | Qty: 30 | Fill #0

## 2019-10-25 NOTE — Progress Notes (Signed)
Virtual Visit via Telephone Note Due to national recommendations of social distancing due to COVID 19, telehealth visit is felt to be most appropriate for this patient at this time.  I discussed the limitations, risks, security and privacy concerns of performing an evaluation and management service by telephone and the availability of in person appointments. I also discussed with the patient that there may be a patient responsible charge related to this service. The patient expressed understanding and agreed to proceed.    I connected with Timothy [name] on 10/25/19  at  10:30 AM EDT  EDT by telephone and verified that I am speaking with the correct person using two identifiers.   Consent I discussed the limitations, risks, security and privacy concerns of performing an evaluation and management service by telephone and the availability of in person appointments. I also discussed with the patient that there may be a patient responsible charge related to this service. The patient expressed understanding and agreed to proceed.   Location of Patient: Private  Residence   [facility] Provider[facility]    Persons participating in Telemedicine visit:   FNP-BC YY Bien CMA Hezzie Bramble    History of Present Illness: Telemedicine visit for: HTN   Essential Hypertension Well controlled. Has blood pressure device at home however has not been monitoring his blood pressure at home. Denies chest pain, shortness of breath, palpitations, lightheadedness, dizziness, headaches or BLE edema. Taking losartan 50 mg daily, chlorthalidone 25 mg daily and bystolic 10 mg daily as prescribed.  BP Readings from Last 3 Encounters:  09/25/19 114/78  08/28/19 138/82  08/07/19 (!) 161/96     Past Medical History:  Diagnosis Date  . Autoimmune hepatitis (HCC)   . Cirrhosis of liver (HCC)   . Hypertension   . Thrombocytopenic (HCC)     History reviewed. No pertinent  surgical history.  Family History  Problem Relation Age of Onset  . Hypertension Sister     Social History   Socioeconomic History  . Marital status: Single    Spouse name: Not on file  . Number of children: Not on file  . Years of education: Not on file  . Highest education level: Not on file  Occupational History  . Not on file  Tobacco Use  . Smoking status: Never Smoker  . Smokeless tobacco: Never Used  Substance and Sexual Activity  . Alcohol use: No    Alcohol/week: 0.0 standard drinks  . Drug use: No  . Sexual activity: Yes  Other Topics Concern  . Not on file  Social History Narrative  . Not on file   Social Determinants of Health   Financial Resource Strain:   . Difficulty of Paying Living Expenses:   Food Insecurity:   . Worried About Running Out of Food in the Last Year:   . Ran Out of Food in the Last Year:   Transportation Needs:   . Lack of Transportation (Medical):   . Lack of Transportation (Non-Medical):   Physical Activity:   . Days of Exercise per Week:   . Minutes of Exercise per Session:   Stress:   . Feeling of Stress :   Social Connections:   . Frequency of Communication with Friends and Family:   . Frequency of Social Gatherings with Friends and Family:   . Attends Religious Services:   . Active Member of Clubs or Organizations:   . Attends Club or Organization Meetings:   . Marital Status:        Observations/Objective: Awake, alert and oriented x 3   Review of Systems  Constitutional: Negative for fever, malaise/fatigue and weight loss.  HENT: Negative.  Negative for nosebleeds.   Eyes: Negative.  Negative for blurred vision, double vision and photophobia.  Respiratory: Negative.  Negative for cough and shortness of breath.   Cardiovascular: Negative.  Negative for chest pain, palpitations and leg swelling.  Gastrointestinal: Negative.  Negative for heartburn, nausea and vomiting.  Musculoskeletal: Negative.  Negative for  myalgias.  Neurological: Negative.  Negative for dizziness, focal weakness, seizures and headaches.  Psychiatric/Behavioral: Negative.  Negative for suicidal ideas.    Assessment and Plan: Timothy [name] was seen today for follow-up.  Diagnoses and all orders for this visit:  Essential hypertension -     chlorthalidone (HYGROTON) 25 MG tablet; Take 1 tablet (25 mg total) by mouth daily. -     CMP14+EGFR; Future Continue all antihypertensives as prescribed.  Remember to bring in your blood pressure log with you for your follow up appointment.  DASH/Mediterranean Diets are healthier choices for HTN.   Thrombocytopenia (HCC) -     CBC with Differential; Future NO NSAIDs, ASA, refrain from alcohol use  Cirrhosis of liver without ascites, unspecified hepatic cirrhosis type (HCC) -     AFP tumor marker; Future  Prediabetes -     Hemoglobin A1c; Future     Follow Up Instructions Return in about 3 months (around 01/24/2020).     I discussed the assessment and treatment plan with the patient. The patient was provided an opportunity to ask questions and all were answered. The patient agreed with the plan and demonstrated an understanding of the instructions.   The patient was advised to call back or seek an in-person evaluation if the symptoms worsen or if the condition fails to improve as anticipated.  I provided 15 minutes of non-face-to-face time during this encounter including median intraservice time, reviewing previous notes, labs, imaging, medications and explaining diagnosis and management.   W , FNP-BC 

## 2019-10-31 ENCOUNTER — Other Ambulatory Visit: Payer: Self-pay

## 2019-10-31 ENCOUNTER — Ambulatory Visit: Payer: Self-pay | Attending: Nurse Practitioner

## 2019-10-31 DIAGNOSIS — D696 Thrombocytopenia, unspecified: Secondary | ICD-10-CM

## 2019-10-31 DIAGNOSIS — K7030 Alcoholic cirrhosis of liver without ascites: Secondary | ICD-10-CM

## 2019-10-31 DIAGNOSIS — I10 Essential (primary) hypertension: Secondary | ICD-10-CM

## 2019-10-31 DIAGNOSIS — K7460 Unspecified cirrhosis of liver: Secondary | ICD-10-CM

## 2019-10-31 DIAGNOSIS — R7303 Prediabetes: Secondary | ICD-10-CM

## 2019-11-01 LAB — CMP14+EGFR
ALT: 29 IU/L (ref 0–44)
AST: 51 IU/L — ABNORMAL HIGH (ref 0–40)
Albumin/Globulin Ratio: 0.9 — ABNORMAL LOW (ref 1.2–2.2)
Albumin: 3.5 g/dL — ABNORMAL LOW (ref 3.8–4.9)
Alkaline Phosphatase: 94 IU/L (ref 39–117)
BUN/Creatinine Ratio: 14 (ref 9–20)
BUN: 20 mg/dL (ref 6–24)
Bilirubin Total: 0.6 mg/dL (ref 0.0–1.2)
CO2: 25 mmol/L (ref 20–29)
Calcium: 9.3 mg/dL (ref 8.7–10.2)
Chloride: 103 mmol/L (ref 96–106)
Creatinine, Ser: 1.47 mg/dL — ABNORMAL HIGH (ref 0.76–1.27)
GFR calc Af Amer: 60 mL/min/{1.73_m2} (ref >59)
GFR calc non Af Amer: 51 mL/min/{1.73_m2} — ABNORMAL LOW (ref >59)
Globulin, Total: 4 g/dL (ref 1.5–4.5)
Glucose: 154 mg/dL — ABNORMAL HIGH (ref 65–99)
Potassium: 3.9 mmol/L (ref 3.5–5.2)
Sodium: 138 mmol/L (ref 134–144)
Total Protein: 7.5 g/dL (ref 6.0–8.5)

## 2019-11-01 LAB — CBC WITH DIFFERENTIAL/PLATELET
Basophils Absolute: 0 10*3/uL (ref 0.0–0.2)
Basos: 1 %
EOS (ABSOLUTE): 0.3 10*3/uL (ref 0.0–0.4)
Eos: 9 %
Hematocrit: 40.9 % (ref 37.5–51.0)
Hemoglobin: 13.5 g/dL (ref 13.0–17.7)
Immature Grans (Abs): 0 10*3/uL (ref 0.0–0.1)
Immature Granulocytes: 0 %
Lymphocytes Absolute: 1.7 10*3/uL (ref 0.7–3.1)
Lymphs: 44 %
MCH: 29.3 pg (ref 26.6–33.0)
MCHC: 33 g/dL (ref 31.5–35.7)
MCV: 89 fL (ref 79–97)
Monocytes Absolute: 0.5 10*3/uL (ref 0.1–0.9)
Monocytes: 12 %
Neutrophils Absolute: 1.3 10*3/uL — ABNORMAL LOW (ref 1.4–7.0)
Neutrophils: 34 %
Platelets: 60 10*3/uL — CL (ref 150–450)
RBC: 4.61 x10E6/uL (ref 4.14–5.80)
RDW: 14.1 % (ref 11.6–15.4)
WBC: 3.8 10*3/uL (ref 3.4–10.8)

## 2019-11-01 LAB — HEMOGLOBIN A1C
Est. average glucose Bld gHb Est-mCnc: 126 mg/dL
Hgb A1c MFr Bld: 6 % — ABNORMAL HIGH (ref 4.8–5.6)

## 2019-11-01 LAB — AFP TUMOR MARKER: AFP, Serum, Tumor Marker: 3.7 ng/mL (ref 0.0–8.3)

## 2019-11-05 ENCOUNTER — Other Ambulatory Visit: Payer: Self-pay | Admitting: Nurse Practitioner

## 2019-11-05 DIAGNOSIS — D696 Thrombocytopenia, unspecified: Secondary | ICD-10-CM

## 2019-11-10 ENCOUNTER — Telehealth: Payer: Self-pay | Admitting: Hematology

## 2019-11-10 NOTE — Telephone Encounter (Signed)
Called pt per 5/07 sch message - per pt did not want to set up an appt at the moment due to lack of insurance

## 2019-11-22 MED FILL — $BYSTOLIC 10 MG TABLET: 10 | 90 days supply | Qty: 90 | Fill #1

## 2019-11-22 MED FILL — LOSARTAN POTASSIUM 50 MG TA: 50 | 30 days supply | Qty: 30 | Fill #4

## 2019-12-27 MED FILL — LOSARTAN POTASSIUM 50 MG TA: 50 | 30 days supply | Qty: 30 | Fill #5

## 2019-12-27 MED FILL — ?CHLORTHALIDONE 25MG TABL: 25 | 30 days supply | Qty: 30 | Fill #1

## 2019-12-29 ENCOUNTER — Encounter: Payer: Self-pay | Admitting: Nurse Practitioner

## 2019-12-29 ENCOUNTER — Ambulatory Visit: Payer: Self-pay | Attending: Nurse Practitioner | Admitting: Nurse Practitioner

## 2019-12-29 ENCOUNTER — Other Ambulatory Visit: Payer: Self-pay

## 2019-12-29 VITALS — BP 124/81 | HR 57 | Temp 97.7°F | Ht 73.5 in | Wt 224.6 lb

## 2019-12-29 DIAGNOSIS — D696 Thrombocytopenia, unspecified: Secondary | ICD-10-CM

## 2019-12-29 DIAGNOSIS — I10 Essential (primary) hypertension: Secondary | ICD-10-CM

## 2019-12-29 NOTE — Progress Notes (Signed)
Assessment & Plan:  Timothy [name] was seen today for follow-up.  Diagnoses and all orders for this visit:  Essential hypertension Continue all antihypertensives as prescribed.  Remember to bring in your blood pressure log with you for your follow up appointment.  DASH/Mediterranean Diets are healthier choices for HTN.    Thrombocytopenia ([place]) -     CBC    Patient has been counseled on age-appropriate routine health concerns for screening and prevention. These are reviewed and up-to-date. Referrals have been placed accordingly. Immunizations are up-to-date or declined.    Subjective:   Chief Complaint  Patient presents with  . Follow-up    Pt. is here for hypertension.    Timothy [name] 59 y.o. male presents to office today for follow up. He has not applied for the financial assistance program. Needs to be referred to GI and hematology. Still has not completed abdominal US.   has a past medical history of Autoimmune hepatitis ([place]), Cirrhosis of liver ([place]), Hypertension, and Thrombocytopenic ([place]).    Essential Hypertension Well controlled today. Taking chlorthalidone 25 mg daily, losartan 50 mg daily and bystolic 10 mg daily. He does not monitor his blood pressure at home. Denies chest pain, shortness of breath, palpitations, lightheadedness, dizziness, headaches or BLE edema.  BP Readings from Last 3 Encounters:  12/29/19 124/81  09/25/19 114/78  08/28/19 138/82    Review of Systems  Constitutional: Negative for fever, malaise/fatigue and weight loss.  HENT: Negative.  Negative for nosebleeds.   Eyes: Negative.  Negative for blurred vision, double vision and photophobia.  Respiratory: Negative.  Negative for cough and shortness of breath.   Cardiovascular: Negative.  Negative for chest pain, palpitations and leg swelling.  Gastrointestinal: Negative.  Negative for heartburn, nausea and vomiting.  Musculoskeletal: Negative.  Negative for myalgias.  Neurological: Negative.   Negative for dizziness, focal weakness, seizures and headaches.  Psychiatric/Behavioral: Negative.  Negative for suicidal ideas.    Past Medical History:  Diagnosis Date  . Autoimmune hepatitis ([place])   . Cirrhosis of liver ([place])   . Hypertension   . Thrombocytopenic ([place])     History reviewed. No pertinent surgical history.  Family History  Problem Relation Age of Onset  . Hypertension Sister     Social History Reviewed with no changes to be made today.   Outpatient Medications Prior to Visit  Medication Sig Dispense Refill  . chlorthalidone (HYGROTON) 25 MG tablet Take 1 tablet (25 mg total) by mouth daily. 90 tablet 1  . fluticasone (FLONASE) 50 MCG/ACT nasal spray Place 2 sprays into both nostrils daily. 16 g 6  . nebivolol (BYSTOLIC) 10 MG tablet Take 1 tablet (10 mg total) by mouth daily. 90 tablet 3  . losartan (COZAAR) 50 MG tablet Take 1 tablet (50 mg total) by mouth daily. 90 tablet 2   No facility-administered medications prior to visit.    Allergies  Allergen Reactions  . Nsaids     thrombocytopenia       Objective:    BP 124/81 (BP Location: Left Arm, Patient Position: Sitting, Cuff Size: Normal)   Pulse (!) 57   Temp 97.7 F (36.5 C) (Temporal)   Ht 6' 1.5" (1.867 m)   Wt 224 lb 9.6 oz (101.9 kg)   SpO2 97%   BMI 29.23 kg/m  Wt Readings from Last 3 Encounters:  12/29/19 224 lb 9.6 oz (101.9 kg)  07/17/19 238 lb (108 kg)  08/05/18 224 lb 6.4 oz (101.8 kg)  Physical Exam Vitals and nursing note reviewed.  Constitutional:      Appearance: He is well-developed.  HENT:     Head: Normocephalic and atraumatic.  Cardiovascular:     Rate and Rhythm: Regular rhythm. Bradycardia present.     Heart sounds: Normal heart sounds. No murmur heard.  No friction rub. No gallop.   Pulmonary:     Effort: Pulmonary effort is normal. No tachypnea or respiratory distress.     Breath sounds: Normal breath sounds. No decreased breath sounds, wheezing, rhonchi  or rales.  Chest:     Chest wall: No tenderness.  Abdominal:     General: Bowel sounds are normal.     Palpations: Abdomen is soft.  Musculoskeletal:        General: Normal range of motion.     Cervical back: Normal range of motion.  Skin:    General: Skin is warm and dry.  Neurological:     Mental Status: He is alert and oriented to person, place, and time.     Coordination: Coordination normal.  Psychiatric:        Behavior: Behavior normal. Behavior is cooperative.        Thought Content: Thought content normal.        Judgment: Judgment normal.          Patient has been counseled extensively about nutrition and exercise as well as the importance of adherence with medications and regular follow-up. The patient was given clear instructions to go to ER or return to medical center if symptoms don't improve, worsen or new problems develop. The patient verbalized understanding.   Follow-up: Return in 3 months (on 03/30/2020), or if symptoms worsen or fail to improve.   Gildardo Pounds, FNP-BC [facility] [place], [place]   12/30/2019, 7:18 PM

## 2019-12-30 ENCOUNTER — Encounter: Payer: Self-pay | Admitting: Nurse Practitioner

## 2019-12-30 LAB — CBC
Hematocrit: 41.4 % (ref 37.5–51.0)
Hemoglobin: 14 g/dL (ref 13.0–17.7)
MCH: 29.5 pg (ref 26.6–33.0)
MCHC: 33.8 g/dL (ref 31.5–35.7)
MCV: 87 fL (ref 79–97)
Platelets: 57 10*3/uL — CL (ref 150–450)
RBC: 4.75 x10E6/uL (ref 4.14–5.80)
RDW: 13.9 % (ref 11.6–15.4)
WBC: 4.6 10*3/uL (ref 3.4–10.8)

## 2020-01-30 MED FILL — LOSARTAN POTASSIUM 50 MG TA: 50 | 30 days supply | Qty: 30 | Fill #6

## 2020-03-06 MED FILL — CHLORTHALIDONE 25 MG TAB: 25 | 30 days supply | Qty: 30 | Fill #2

## 2020-03-06 MED FILL — LOSARTAN POTASSIUM 50 MG TA: 50 | 30 days supply | Qty: 30 | Fill #7

## 2020-04-22 MED FILL — LOSARTAN POTASSIUM 50 MG TA: 50 | 30 days supply | Qty: 30 | Fill #8

## 2020-04-22 MED FILL — BYSTOLIC 10 MG TABLET: 10 | 30 days supply | Qty: 30 | Fill #2

## 2020-05-24 ENCOUNTER — Other Ambulatory Visit: Payer: Self-pay | Admitting: Nurse Practitioner

## 2020-05-24 DIAGNOSIS — I10 Essential (primary) hypertension: Secondary | ICD-10-CM

## 2020-05-24 NOTE — Telephone Encounter (Signed)
Requested medication (s) are due for refill today: expired medication  Requested medication (s) are on the active medication list: yes  Last refill:  start: 07/17/19 end: 10/15/19 #90 2 refills   Future visit scheduled: yes in 1 week   Notes to clinic:  expired medication. Do you want to renew Rx?     Requested Prescriptions  Pending Prescriptions Disp Refills   losartan (COZAAR) 50 MG tablet [Pharmacy Med Name: LOSARTAN POTASSIUM 50 MG TA 50 Tablet] 30 tablet 2    Sig: Take 1 tablet (50 mg total) by mouth daily.      Cardiovascular:  Angiotensin Receptor Blockers Failed - 05/24/2020 10:38 AM      Failed - Cr in normal range and within 180 days    Creatinine  Date Value Ref Range Status  01/27/2016 1.2 0.7 - 1.3 mg/dL Final   Creatinine, Ser  Date Value Ref Range Status  10/31/2019 1.47 (H) 0.76 - 1.27 mg/dL Final   Creatinine, POC  Date Value Ref Range Status  11/25/2016 100 mg/dL Final          Failed - K in normal range and within 180 days    Potassium  Date Value Ref Range Status  10/31/2019 3.9 3.5 - 5.2 mmol/L Final  01/27/2016 4.1 3.5 - 5.1 mEq/L Final          Passed - Patient is not pregnant      Passed - Last BP in normal range    BP Readings from Last 1 Encounters:  12/29/19 124/81          Passed - Valid encounter within last 6 months    Recent Outpatient Visits           4 months ago Essential hypertension   [place], Vernia Buff, NP   7 months ago Essential hypertension   [place], Vernia Buff, NP   8 months ago Essential hypertension   [place], Jarome Matin, RPH-CPP   9 months ago Essential hypertension   [place], Jarome Matin, RPH-CPP   9 months ago Essential hypertension   [place], Jarome Matin, RPH-CPP       Future Appointments              In 1 week Gildardo Pounds, NP [place]

## 2020-05-27 ENCOUNTER — Other Ambulatory Visit: Payer: Self-pay | Admitting: Nurse Practitioner

## 2020-05-28 MED FILL — LOSARTAN POTASSIUM 50 MG TA: 50 | 30 days supply | Qty: 30 | Fill #0

## 2020-05-29 ENCOUNTER — Other Ambulatory Visit: Payer: Self-pay

## 2020-05-29 ENCOUNTER — Other Ambulatory Visit: Payer: Self-pay | Admitting: Nurse Practitioner

## 2020-05-29 DIAGNOSIS — I10 Essential (primary) hypertension: Secondary | ICD-10-CM

## 2020-05-29 MED ORDER — CHLORTHALIDONE 25 MG PO TABS: 25.0000 mg | ORAL_TABLET | Freq: Every day | ORAL | 0 refills | Status: DC

## 2020-05-29 MED FILL — CHLORTHALIDONE 25 MG TAB: 25 | 30 days supply | Qty: 30 | Fill #0

## 2020-05-29 NOTE — Telephone Encounter (Signed)
Pt. Request refill for Chlorthalidone. CMA will refill it.

## 2020-06-03 ENCOUNTER — Encounter: Payer: Self-pay | Admitting: Nurse Practitioner

## 2020-06-03 ENCOUNTER — Ambulatory Visit: Payer: Self-pay | Attending: Nurse Practitioner | Admitting: Nurse Practitioner

## 2020-06-03 ENCOUNTER — Other Ambulatory Visit: Payer: Self-pay

## 2020-06-03 DIAGNOSIS — D696 Thrombocytopenia, unspecified: Secondary | ICD-10-CM

## 2020-06-03 DIAGNOSIS — R7303 Prediabetes: Secondary | ICD-10-CM

## 2020-06-03 DIAGNOSIS — J302 Other seasonal allergic rhinitis: Secondary | ICD-10-CM

## 2020-06-03 DIAGNOSIS — E785 Hyperlipidemia, unspecified: Secondary | ICD-10-CM

## 2020-06-03 DIAGNOSIS — I10 Essential (primary) hypertension: Secondary | ICD-10-CM

## 2020-06-03 DIAGNOSIS — K7460 Unspecified cirrhosis of liver: Secondary | ICD-10-CM

## 2020-06-03 MED ORDER — LORATADINE 10 MG PO TABS: 10.0000 mg | ORAL_TABLET | Freq: Every day | ORAL | 11 refills | Status: DC

## 2020-06-03 MED ORDER — FLUTICASONE PROPIONATE 50 MCG/ACT NA SUSP: 2.0000 | Freq: Every day | NASAL | 6 refills | Status: DC

## 2020-06-03 NOTE — Progress Notes (Signed)
Virtual Visit via Telephone Note Due to national recommendations of social distancing due to [place] 19, telehealth visit is felt to be most appropriate for this patient at this time.  I discussed the limitations, risks, security and privacy concerns of performing an evaluation and management service by telephone and the availability of in person appointments. I also discussed with the patient that there may be a patient responsible charge related to this service. The patient expressed understanding and agreed to proceed.    I connected with Junius Finner on 06/03/20  at   4:10 PM EST  EDT by telephone and verified that I am speaking with the correct person using two identifiers.   Consent I discussed the limitations, risks, security and privacy concerns of performing an evaluation and management service by telephone and the availability of in person appointments. I also discussed with the patient that there may be a patient responsible charge related to this service. The patient expressed understanding and agreed to proceed.   Location of Patient: Private Residence   Location of Provider: [place] and [place] participating in Telemedicine visit: Geryl Rankins FNP-BC [place]    History of Present Illness: Telemedicine visit for: Follow up  has a past medical history of Autoimmune hepatitis ([place]), Cirrhosis of liver ([place]), Hypertension, and Thrombocytopenia ([place]).  He has liver cirrhosis. Has been advised during numerous office visits and tele visits to apply for the financial assistance program. He has been referred to hepatology specialist and gastroenterology and he has not responded to their requests to set up an appointment.  He was treated in the past for Autoimmune hepatitis in 2009 but unable to recall his treatment. His platelets are consistently 70-80s. Per Hematology he was instructed to return in 6 months for follow up and treatment in  2019. He has been lost to follow up. PER Oncology note 11-10-2017: -If his platelets continue to trend downwards <30K with no bleeding or <50K with concerns for bleeding- I would recommend Nplate injections   Today he has no complaints aside from symptoms of seasonal allergies including nasal congestion, rhinorrhea and dry itchy eyes. Onset of symptoms was a few weeks ago.   Essential Hypertension Notes Home readings: 136-138/80s. Denies chest pain, shortness of breath, palpitations, lightheadedness, dizziness, headaches or BLE edema. Currently taking losartan 50 mg daily, bystolic 10 mg daily and chlorthalidone 25 mg daily as prescribed.  BP Readings from Last 3 Encounters:  12/29/19 124/81  09/25/19 114/78  08/28/19 138/82      Past Medical History:  Diagnosis Date  . Autoimmune hepatitis ([place])   . Cirrhosis of liver ([place])   . Hypertension   . Thrombocytopenia ([place])     History reviewed. No pertinent surgical history.  Family History  Problem Relation Age of Onset  . Hypertension Sister     Social History   Socioeconomic History  . Marital status: Single    Spouse name: Not on file  . Number of children: Not on file  . Years of education: Not on file  . Highest education level: Not on file  Occupational History  . Not on file  Tobacco Use  . Smoking status: Never Smoker  . Smokeless tobacco: Never Used  Vaping Use  . Vaping Use: Never used  Substance and Sexual Activity  . Alcohol use: No    Alcohol/week: 0.0 standard drinks  . Drug use: No  . Sexual activity: Yes  Other Topics Concern  .  Not on file  Social History Narrative  . Not on file   Social Determinants of Health   Financial Resource Strain:   . Difficulty of Paying Living Expenses: Not on file  Food Insecurity:   . Worried About Charity fundraiser in the Last Year: Not on file  . Ran Out of Food in the Last Year: Not on file  Transportation Needs:   . Lack of Transportation (Medical): Not on file   . Lack of Transportation (Non-Medical): Not on file  Physical Activity:   . Days of Exercise per Week: Not on file  . Minutes of Exercise per Session: Not on file  Stress:   . Feeling of Stress : Not on file  Social Connections:   . Frequency of Communication with Friends and Family: Not on file  . Frequency of Social Gatherings with Friends and Family: Not on file  . Attends Religious Services: Not on file  . Active Member of Clubs or Organizations: Not on file  . Attends Archivist Meetings: Not on file  . Marital Status: Not on file     Observations/Objective: Awake, alert and oriented x 3   Review of Systems  Constitutional: Negative for fever, malaise/fatigue and weight loss.  HENT: Positive for congestion. Negative for ear pain, nosebleeds, sinus pain and tinnitus.   Eyes: Negative.  Negative for blurred vision, double vision and photophobia.  Respiratory: Negative.  Negative for cough and shortness of breath.   Cardiovascular: Negative.  Negative for chest pain, palpitations and leg swelling.  Gastrointestinal: Negative.  Negative for heartburn, nausea and vomiting.  Musculoskeletal: Negative.  Negative for myalgias.  Neurological: Negative.  Negative for dizziness, focal weakness, seizures and headaches.  Endo/Heme/Allergies: Positive for environmental allergies.  Psychiatric/Behavioral: Negative.  Negative for suicidal ideas.    Assessment and Plan: J[place]el was seen today for allergies.  Diagnoses and all orders for this visit:  Seasonal allergies -     loratadine (CLARITIN) 10 MG tablet; Take 1 tablet (10 mg total) by mouth daily. -     fluticasone (FLONASE) 50 MCG/ACT nasal spray; Place 2 sprays into both nostrils daily.  Cirrhosis of liver without ascites, unspecified hepatic cirrhosis type (HCC) -     AFP tumor marker; Future  Thrombocytopenia (HCC) -     CBC; Future  Essential hypertension -     CMP14+EGFR; Future  Prediabetes -     Hemoglobin  A1c; Future  Dyslipidemia, goal LDL below 100 -     Lipid panel; Future     Follow Up Instructions Return in about 2 months (around 08/03/2020).     I discussed the assessment and treatment plan with the patient. The patient was provided an opportunity to ask questions and all were answered. The patient agreed with the plan and demonstrated an understanding of the instructions.   The patient was advised to call back or seek an in-person evaluation if the symptoms worsen or if the condition fails to improve as anticipated.  I provided 18 minutes of non-face-to-face time during this encounter including median intraservice time, reviewing previous notes, labs, imaging, medications and explaining diagnosis and management.  Gildardo Pounds, FNP-BC

## 2020-06-11 ENCOUNTER — Ambulatory Visit: Payer: Self-pay | Attending: Nurse Practitioner

## 2020-06-11 ENCOUNTER — Other Ambulatory Visit: Payer: Self-pay

## 2020-06-11 DIAGNOSIS — K7460 Unspecified cirrhosis of liver: Secondary | ICD-10-CM

## 2020-06-11 DIAGNOSIS — E785 Hyperlipidemia, unspecified: Secondary | ICD-10-CM

## 2020-06-11 DIAGNOSIS — D696 Thrombocytopenia, unspecified: Secondary | ICD-10-CM

## 2020-06-11 DIAGNOSIS — I10 Essential (primary) hypertension: Secondary | ICD-10-CM

## 2020-06-11 DIAGNOSIS — R7303 Prediabetes: Secondary | ICD-10-CM

## 2020-06-12 LAB — CMP14+EGFR
ALT: 29 IU/L (ref 0–44)
AST: 58 IU/L — ABNORMAL HIGH (ref 0–40)
Albumin/Globulin Ratio: 0.8 — ABNORMAL LOW (ref 1.2–2.2)
Albumin: 3.3 g/dL — ABNORMAL LOW (ref 3.8–4.9)
Alkaline Phosphatase: 93 IU/L (ref 44–121)
BUN/Creatinine Ratio: 13 (ref 10–24)
BUN: 17 mg/dL (ref 8–27)
Bilirubin Total: 0.8 mg/dL (ref 0.0–1.2)
CO2: 22 mmol/L (ref 20–29)
Calcium: 9.2 mg/dL (ref 8.6–10.2)
Chloride: 106 mmol/L (ref 96–106)
Creatinine, Ser: 1.31 mg/dL — ABNORMAL HIGH (ref 0.76–1.27)
GFR calc Af Amer: 68 mL/min/{1.73_m2} (ref >59)
GFR calc non Af Amer: 59 mL/min/{1.73_m2} — ABNORMAL LOW (ref >59)
Globulin, Total: 4.1 g/dL (ref 1.5–4.5)
Glucose: 131 mg/dL — ABNORMAL HIGH (ref 65–99)
Potassium: 3.9 mmol/L (ref 3.5–5.2)
Sodium: 138 mmol/L (ref 134–144)
Total Protein: 7.4 g/dL (ref 6.0–8.5)

## 2020-06-12 LAB — LIPID PANEL
Chol/HDL Ratio: 2.9 ratio (ref 0.0–5.0)
Cholesterol, Total: 159 mg/dL (ref 100–199)
HDL: 54 mg/dL (ref >39)
LDL Chol Calc (NIH): 92 mg/dL (ref 0–99)
Triglycerides: 64 mg/dL (ref 0–149)
VLDL Cholesterol Cal: 13 mg/dL (ref 5–40)

## 2020-06-12 LAB — HEMOGLOBIN A1C
Est. average glucose Bld gHb Est-mCnc: 123 mg/dL
Hgb A1c MFr Bld: 5.9 % — ABNORMAL HIGH (ref 4.8–5.6)

## 2020-06-12 LAB — CBC
Hematocrit: 41.8 % (ref 37.5–51.0)
Hemoglobin: 14 g/dL (ref 13.0–17.7)
MCH: 29.2 pg (ref 26.6–33.0)
MCHC: 33.5 g/dL (ref 31.5–35.7)
MCV: 87 fL (ref 79–97)
Platelets: 61 10*3/uL — CL (ref 150–450)
RBC: 4.8 x10E6/uL (ref 4.14–5.80)
RDW: 13.9 % (ref 11.6–15.4)
WBC: 4.2 10*3/uL (ref 3.4–10.8)

## 2020-06-12 LAB — AFP TUMOR MARKER: AFP, Serum, Tumor Marker: 3.2 ng/mL (ref 0.0–8.3)

## 2020-07-04 ENCOUNTER — Other Ambulatory Visit: Payer: Self-pay

## 2020-07-04 DIAGNOSIS — Z20822 Contact with and (suspected) exposure to covid-19: Secondary | ICD-10-CM

## 2020-07-07 LAB — NOVEL CORONAVIRUS, NAA: SARS-CoV-2, NAA: NOT DETECTED

## 2020-07-09 ENCOUNTER — Other Ambulatory Visit: Payer: Self-pay | Admitting: Nurse Practitioner

## 2020-07-09 DIAGNOSIS — I10 Essential (primary) hypertension: Secondary | ICD-10-CM

## 2020-07-09 MED FILL — LOSARTAN POTASSIUM 50 MG TA: 50 | 30 days supply | Qty: 30 | Fill #0

## 2020-07-09 MED FILL — CHLORTHALIDONE 25 MG TAB: 25 | 30 days supply | Qty: 30 | Fill #2

## 2020-07-29 ENCOUNTER — Ambulatory Visit: Payer: Self-pay | Admitting: Nurse Practitioner

## 2020-07-30 ENCOUNTER — Ambulatory Visit: Payer: Self-pay | Attending: Nurse Practitioner | Admitting: Critical Care Medicine

## 2020-07-30 ENCOUNTER — Other Ambulatory Visit: Payer: Self-pay

## 2020-07-30 ENCOUNTER — Encounter: Payer: Self-pay | Admitting: Critical Care Medicine

## 2020-07-30 ENCOUNTER — Other Ambulatory Visit: Payer: Self-pay | Admitting: Critical Care Medicine

## 2020-07-30 VITALS — BP 152/111 | HR 76 | Temp 98.6°F | Resp 16 | Wt 234.0 lb

## 2020-07-30 DIAGNOSIS — I10 Essential (primary) hypertension: Secondary | ICD-10-CM

## 2020-07-30 DIAGNOSIS — R7401 Elevation of levels of liver transaminase levels: Secondary | ICD-10-CM

## 2020-07-30 DIAGNOSIS — R7402 Elevation of levels of lactic acid dehydrogenase (LDH): Secondary | ICD-10-CM

## 2020-07-30 DIAGNOSIS — K7460 Unspecified cirrhosis of liver: Secondary | ICD-10-CM

## 2020-07-30 DIAGNOSIS — D696 Thrombocytopenia, unspecified: Secondary | ICD-10-CM

## 2020-07-30 DIAGNOSIS — K754 Autoimmune hepatitis: Secondary | ICD-10-CM

## 2020-07-30 DIAGNOSIS — B1920 Unspecified viral hepatitis C without hepatic coma: Secondary | ICD-10-CM

## 2020-07-30 DIAGNOSIS — Z1211 Encounter for screening for malignant neoplasm of colon: Secondary | ICD-10-CM

## 2020-07-30 MED ORDER — LOSARTAN POTASSIUM 50 MG PO TABS: 50.0000 mg | ORAL_TABLET | Freq: Every day | ORAL | 3 refills | Status: DC

## 2020-07-30 MED ORDER — NEBIVOLOL HCL 10 MG PO TABS: 10.0000 mg | ORAL_TABLET | Freq: Every day | ORAL | 3 refills | Status: DC

## 2020-07-30 MED ORDER — CHLORTHALIDONE 25 MG PO TABS: 25.0000 mg | ORAL_TABLET | Freq: Every day | ORAL | 0 refills | Status: DC

## 2020-07-30 MED FILL — BYSTOLIC 10 MG TABLET: 10 | 30 days supply | Qty: 30 | Fill #0

## 2020-07-30 MED FILL — CHLORTHALIDONE 25 MG TAB: 25 | 30 days supply | Qty: 30 | Fill #0

## 2020-07-30 NOTE — Patient Instructions (Signed)
Labs today include hepatitis C studies liver function metabolic panel blood counts , colon cancer screening kit issued  Refills on your losartan Bystolic and chlorthalidone sent to your clinic pharmacy  Financial assistance packet will be reissued for this patient to reapply for financial assistance  Return to see Dr. Joya Gaskins in 6 weeks recheck blood pressure

## 2020-07-30 NOTE — Assessment & Plan Note (Signed)
Check liver function at this visit

## 2020-07-30 NOTE — Progress Notes (Signed)
Subjective:    Patient ID: Timothy [name], male    DOB: 27-May-1961, 60 y.o.   MRN: [id number]  60 y.o. M Pcp [facility] of [name]  Hx HTN, cirrhosis, autoimmune hepatitis, thrombocytopenia   07/30/2020 This patient presents today requesting to change to a different primary care provider.  He was seen by way of a telehealth visit in November 2021 as documented below. Last seen by telehealth visit 05/2020: He has liver cirrhosis. Has been advised during numerous office visits and tele visits to apply for the financial assistance program. He has been referred to hepatology specialist and gastroenterology and he has not responded to their requests to set up an appointment.  He was treated in the past for Autoimmune hepatitis in 2009 but unable to recall his treatment. His platelets are consistently 70-80s. Per Hematology he was instructed to return in 6 months for follow up and treatment in 2019. He has been lost to follow up. PER Oncology note 11-10-2017: -If his platelets continue to trend downwards <30K with no bleeding or <50K with concerns for bleeding- I would recommend Nplate injections  Today he has no complaints aside from symptoms of seasonal allergies including nasal congestion, rhinorrhea and dry itchy eyes. Onset of symptoms was a few weeks ago.   Essential Hypertension Notes Home readings: 136-138/80s. Denies chest pain, shortness of breath, palpitations, lightheadedness, dizziness, headaches or BLE edema. Currently taking losartan 50 mg daily, bystolic 10 mg daily and chlorthalidone 25 mg daily as prescribed.   This patient on arrival has a blood pressure 152/111.  He just ran out of his chlorthalidone and losartan and has a few Bystolic left. Patient has history of autoimmune hepatitis in my look in his record he was followed in [place] at the hepatology clinic up to 2009.  At that time he was on azathioprine for his inflammation of the liver.  He has since developed liver cirrhosis.  He is no  longer on the azathioprine. Patient does have chronic thrombocytopenia with of the liver disease.  He does not have hepatitis C that is active at this time apparently was treated for this in the past but it is unclear to me whether he was fully treated or not  Patient has been offered on numerous occasions financial assistance has never been able to follow through and complete the applications and obtain the assistance.  He was seen by hematology for his thrombocytopenia and they indicated as noted above that he might require treatment of his platelet counts fell further.  He denies any active bleeding at this time.  Initially when I saw the patient today I thought it was for a blood pressure check but he then described to me that he wished to change to a different provider.  I did confer with his previous provider [name] on this issue.  Patient does have home blood pressure readings and he states they have been lower than those in here in the office but when he is on all of his medications his blood pressures are quite well controlled  Note the patient is due a Covid booster he is also due a colon cancer screen Patient is also due a tetanus vaccine and a flu vaccine he did except both of these on arrival  Past Medical History:  Diagnosis Date  . Autoimmune hepatitis ([place])   . Cirrhosis of liver ([place])   . Hypertension   . Thrombocytopenia ([place])      Family History  Problem Relation Age of  Onset  . Hypertension Sister      Social History   Socioeconomic History  . Marital status: Single    Spouse name: Not on file  . Number of children: Not on file  . Years of education: Not on file  . Highest education level: Not on file  Occupational History  . Not on file  Tobacco Use  . Smoking status: Never Smoker  . Smokeless tobacco: Never Used  Vaping Use  . Vaping Use: Never used  Substance and Sexual Activity  . Alcohol use: No    Alcohol/week: 0.0 standard drinks  . Drug use:  No  . Sexual activity: Yes  Other Topics Concern  . Not on file  Social History Narrative  . Not on file   Social Determinants of Health   Financial Resource Strain: Not on file  Food Insecurity: Not on file  Transportation Needs: Not on file  Physical Activity: Not on file  Stress: Not on file  Social Connections: Not on file  Intimate Partner Violence: Not on file     Allergies  Allergen Reactions  . Nsaids     thrombocytopenia     Outpatient Medications Prior to Visit  Medication Sig Dispense Refill  . BYSTOLIC 10 MG tablet TAKE 1 TABLET (10 MG TOTAL) BY MOUTH DAILY. 30 tablet 3  . chlorthalidone (HYGROTON) 25 MG tablet TAKE 1 TABLET (25 MG TOTAL) BY MOUTH DAILY. 30 tablet 0  . losartan (COZAAR) 50 MG tablet TAKE 1 TABLET (50 MG TOTAL) BY MOUTH DAILY. 30 tablet 0  . chlorthalidone (HYGROTON) 25 MG tablet Take 1 tablet (25 mg total) by mouth daily. 30 tablet 0  . fluticasone (FLONASE) 50 MCG/ACT nasal spray Place 2 sprays into both nostrils daily. (Patient not taking: Reported on 07/30/2020) 16 g 6  . loratadine (CLARITIN) 10 MG tablet Take 1 tablet (10 mg total) by mouth daily. (Patient not taking: Reported on 07/30/2020) 30 tablet 11  . nebivolol (BYSTOLIC) 10 MG tablet Take 1 tablet (10 mg total) by mouth daily. 90 tablet 3   No facility-administered medications prior to visit.      Review of Systems  Respiratory: Negative for shortness of breath and wheezing.   Cardiovascular: Negative for chest pain.  Neurological: Negative for headaches.       Objective:   Physical Exam Vitals:   07/30/20 1104  BP: (!) 152/111  Pulse: 76  Resp: 16  Temp: 98.6 F (37 C)  SpO2: 99%  Weight: 234 lb (106.1 kg)    Gen: Pleasant, well-nourished, in no distress,  normal affect  ENT: No lesions,  mouth clear,  oropharynx clear, no postnasal drip  Neck: No JVD, no TMG, no carotid bruits  Lungs: No use of accessory muscles, no dullness to percussion, clear without rales or  rhonchi  Cardiovascular: RRR, heart sounds normal, no murmur or gallops, no peripheral edema  Abdomen: soft and NT, no HSM,  BS normal  Musculoskeletal: No deformities, no cyanosis or clubbing  Neuro: alert, non focal  Skin: Warm, no lesions or rashes  No results found.        Assessment & Plan:  I personally reviewed all images and lab data in the [facility] system as well as any outside material available during this office visit and agree with the  radiology impressions.   Essential hypertension Hypertension not well controlled due to lack of access to medications he has run out of his medications I will plan to refill his losartan  chlorthalidone and Bystolic  Indicated to him if he can get the blue card he can get 90-day supplies of his medicines and his cost will be lowered to him for his pharmaceuticals  Patient was adamant he had tried and failed several times and was not been to pursue the financial assistance I told him he must do this if he is to get good access to care  He also needs a referral back to hematology and gastroenterology  Autoimmune hepatitis Plan will be to follow-up his liver function alpha-fetoprotein levels hepatitis C levels I need to get him into gastroenterology but cannot do this until we get financial assistance squared away  Hepatitis C Need to assess current state of his cirrhosis and hepatitis C  Liver cirrhosis ([place]) As per above assessments  ABNORMAL TRANSAMINASE-LFT'S Check liver function at this visit  Thrombocytopenia ([place]) Repeat platelet count   [place] was seen today for medication refill and hypertension.  Diagnoses and all orders for this visit:  Hepatitis C virus infection without hepatic coma, unspecified chronicity -     HCV Ab w Reflex to Quant PCR -     HCV RNA quant  Essential hypertension -     losartan (COZAAR) 50 MG tablet; Take 1 tablet (50 mg total) by mouth daily. -     chlorthalidone (HYGROTON) 25 MG tablet;  Take 1 tablet (25 mg total) by mouth daily. -     nebivolol (BYSTOLIC) 10 MG tablet; Take 1 tablet (10 mg total) by mouth daily. -     Comprehensive metabolic panel -     CBC with Differential/Platelet  Cirrhosis of liver without ascites, unspecified hepatic cirrhosis type (HCC) -     AFP tumor marker  Colon cancer screening -     Fecal occult blood, imunochemical  Autoimmune hepatitis ([place])  ABNORMAL TRANSAMINASE-LFT'S  Thrombocytopenia ([place])   I discussed with [name] and for now we will switch the patient under my primary care service for now  We will see the patient back in short-term follow-up after he gets back on his medications and connect him with financial services

## 2020-07-30 NOTE — Assessment & Plan Note (Signed)
As per above assessments

## 2020-07-30 NOTE — Assessment & Plan Note (Signed)
Hypertension not well controlled due to lack of access to medications he has run out of his medications I will plan to refill his losartan chlorthalidone and Bystolic  Indicated to him if he can get the blue card he can get 90-day supplies of his medicines and his cost will be lowered to him for his pharmaceuticals  Patient was adamant he had tried and failed several times and was not been to pursue the financial assistance I told him he must do this if he is to get good access to care  He also needs a referral back to hematology and gastroenterology

## 2020-07-30 NOTE — Assessment & Plan Note (Signed)
Repeat platelet count.

## 2020-07-30 NOTE — Assessment & Plan Note (Signed)
Plan will be to follow-up his liver function alpha-fetoprotein levels hepatitis C levels I need to get him into gastroenterology but cannot do this until we get financial assistance squared away

## 2020-07-30 NOTE — Progress Notes (Signed)
Needs RF on medications  States BP readings have been good SBP 128-140 He did no take all medications today d/t needing a refill

## 2020-07-30 NOTE — Assessment & Plan Note (Signed)
Need to assess current state of his cirrhosis and hepatitis C

## 2020-08-01 LAB — COMPREHENSIVE METABOLIC PANEL
ALT: 36 IU/L (ref 0–44)
AST: 70 IU/L — ABNORMAL HIGH (ref 0–40)
Albumin/Globulin Ratio: 0.7 — ABNORMAL LOW (ref 1.2–2.2)
Albumin: 3.2 g/dL — ABNORMAL LOW (ref 3.8–4.9)
Alkaline Phosphatase: 108 IU/L (ref 44–121)
BUN/Creatinine Ratio: 13 (ref 10–24)
BUN: 16 mg/dL (ref 8–27)
Bilirubin Total: 0.9 mg/dL (ref 0.0–1.2)
CO2: 25 mmol/L (ref 20–29)
Calcium: 9.4 mg/dL (ref 8.6–10.2)
Chloride: 106 mmol/L (ref 96–106)
Creatinine, Ser: 1.27 mg/dL (ref 0.76–1.27)
GFR calc Af Amer: 71 mL/min/{1.73_m2} (ref >59)
GFR calc non Af Amer: 61 mL/min/{1.73_m2} (ref >59)
Globulin, Total: 4.5 g/dL (ref 1.5–4.5)
Glucose: 142 mg/dL — ABNORMAL HIGH (ref 65–99)
Potassium: 4 mmol/L (ref 3.5–5.2)
Sodium: 142 mmol/L (ref 134–144)
Total Protein: 7.7 g/dL (ref 6.0–8.5)

## 2020-08-01 LAB — CBC WITH DIFFERENTIAL/PLATELET
Basophils Absolute: 0 10*3/uL (ref 0.0–0.2)
Basos: 1 %
EOS (ABSOLUTE): 0.8 10*3/uL — ABNORMAL HIGH (ref 0.0–0.4)
Eos: 18 %
Hematocrit: 40.3 % (ref 37.5–51.0)
Hemoglobin: 13.6 g/dL (ref 13.0–17.7)
Immature Grans (Abs): 0 10*3/uL (ref 0.0–0.1)
Immature Granulocytes: 0 %
Lymphocytes Absolute: 1.6 10*3/uL (ref 0.7–3.1)
Lymphs: 38 %
MCH: 28.5 pg (ref 26.6–33.0)
MCHC: 33.7 g/dL (ref 31.5–35.7)
MCV: 84 fL (ref 79–97)
Monocytes Absolute: 0.4 10*3/uL (ref 0.1–0.9)
Monocytes: 9 %
Neutrophils Absolute: 1.5 10*3/uL (ref 1.4–7.0)
Neutrophils: 34 %
Platelets: 63 10*3/uL — CL (ref 150–450)
RBC: 4.78 x10E6/uL (ref 4.14–5.80)
RDW: 14.3 % (ref 11.6–15.4)
WBC: 4.3 10*3/uL (ref 3.4–10.8)

## 2020-08-01 LAB — HCV RNA QUANT: Hepatitis C Quantitation: NOT DETECTED IU/mL

## 2020-08-01 LAB — AFP TUMOR MARKER: AFP, Serum, Tumor Marker: 2.8 ng/mL (ref 0.0–8.3)

## 2020-08-01 LAB — HCV INTERPRETATION

## 2020-08-01 LAB — HCV AB W REFLEX TO QUANT PCR: HCV Ab: 0.2 s/co ratio (ref 0.0–0.9)

## 2020-08-09 MED FILL — LOSARTAN POTASSIUM 50 MG TA: 50 | 30 days supply | Qty: 30 | Fill #0

## 2020-08-30 MED FILL — $BYSTOLIC 10 MG TABLET: 10 | 90 days supply | Qty: 90 | Fill #1

## 2020-09-13 ENCOUNTER — Other Ambulatory Visit: Payer: Self-pay | Admitting: Critical Care Medicine

## 2020-09-13 ENCOUNTER — Other Ambulatory Visit: Payer: Self-pay | Admitting: Nurse Practitioner

## 2020-09-13 DIAGNOSIS — I10 Essential (primary) hypertension: Secondary | ICD-10-CM

## 2020-09-13 MED FILL — CHLORTHALIDONE 25 MG TAB: 25 | 30 days supply | Qty: 30 | Fill #0

## 2020-09-13 MED FILL — LOSARTAN POTASSIUM 50 MG TA: 50 | 30 days supply | Qty: 30 | Fill #1

## 2020-09-25 ENCOUNTER — Ambulatory Visit: Payer: Self-pay | Admitting: Nurse Practitioner

## 2020-10-11 ENCOUNTER — Other Ambulatory Visit: Payer: Self-pay

## 2020-10-19 NOTE — Progress Notes (Deleted)
Subjective:    Patient ID: Timothy [name], male    DOB: [birth year], 60 y.o.   MRN: [id number]  60 y.o. M Pcp Pt of Timothy [name]  Hx HTN, cirrhosis, autoimmune hepatitis, thrombocytopenia   07/30/2020 This patient presents today requesting to change to a different primary care provider.  He was seen by way of a telehealth visit in November 2021 as documented below. Last seen by telehealth visit 05/2020: He has liver cirrhosis. Has been advised during numerous office visits and tele visits to apply for the financial assistance program. He has been referred to hepatology specialist and gastroenterology and he has not responded to their requests to set up an appointment.  He was treated in the past for Autoimmune hepatitis in 2009 but unable to recall his treatment. His platelets are consistently 70-80s. Per Timothy [name] he was instructed to return in 6 months for follow up and treatment in 2019. He has been lost to follow up. PER Timothy [name] note 11-10-2017: -If his platelets continue to trend downwards <30K with no bleeding or <50K with concerns for bleeding- I would recommend Nplate injections  Today he has no complaints aside from symptoms of seasonal allergies including nasal congestion, rhinorrhea and dry itchy eyes. Onset of symptoms was a few weeks ago.   Essential Hypertension Notes Home readings: 136-138/80s. Denies chest pain, shortness of breath, palpitations, lightheadedness, dizziness, headaches or BLE edema. Currently taking losartan 50 mg daily, bystolic 10 mg daily and chlorthalidone 25 mg daily as prescribed.   This patient on arrival has a blood pressure 152/111.  He just ran out of his chlorthalidone and losartan and has a few Bystolic left. Patient has history of autoimmune hepatitis in my look in his record he was followed in [name] at the hepatology clinic up to 2009.  At that time he was on azathioprine for his inflammation of the liver.  He has since developed liver cirrhosis.  He is no  longer on the azathioprine. Patient does have chronic thrombocytopenia with of the liver disease.  He does not have hepatitis C that is active at this time apparently was treated for this in the past but it is unclear to me whether he was fully treated or not  Patient has been offered on numerous occasions financial assistance has never been able to follow through and complete the applications and obtain the assistance.  He was seen by Timothy [name] for his thrombocytopenia and they indicated as noted above that he might require treatment of his platelet counts fell further.  He denies any active bleeding at this time.  Initially when I saw the patient today I thought it was for a blood pressure check but he then described to me that he wished to change to a different provider.  I did confer with his previous provider Timothy [name] on this issue.  Patient does have home blood pressure readings and he states they have been lower than those in here in the office but when he is on all of his medications his blood pressures are quite well controlled  Note the patient is due a Covid booster he is also due a colon cancer screen Patient is also due a tetanus vaccine and a flu vaccine he did except both of these on arrival  4/18  Essential hypertension Hypertension not well controlled due to lack of access to medications he has run out of his medications I will plan to refill his losartan chlorthalidone and Bystolic  Indicated to him if he can  get the blue card he can get 90-day supplies of his medicines and his cost will be lowered to him for his pharmaceuticals  Patient was adamant he had tried and failed several times and was not been to pursue the financial assistance I told him he must do this if he is to get good access to care  He also needs a referral back to Timothy [name] and gastroenterology  Autoimmune hepatitis Plan will be to follow-up his liver function alpha-fetoprotein levels hepatitis C  levels I need to get him into gastroenterology but cannot do this until we get financial assistance squared away  Hepatitis C Need to assess current state of his cirrhosis and hepatitis C  Liver cirrhosis ([place]) As per above assessments  ABNORMAL TRANSAMINASE-LFT'S Check liver function at this visit  Thrombocytopenia ([place]) Repeat platelet count   [name] was seen today for medication refill and hypertension.  Diagnoses and all orders for this visit:  Hepatitis C virus infection without hepatic coma, unspecified chronicity -     HCV Ab w Reflex to Quant PCR -     HCV RNA quant  Essential hypertension -     losartan (COZAAR) 50 MG tablet; Take 1 tablet (50 mg total) by mouth daily. -     chlorthalidone (HYGROTON) 25 MG tablet; Take 1 tablet (25 mg total) by mouth daily. -     nebivolol (BYSTOLIC) 10 MG tablet; Take 1 tablet (10 mg total) by mouth daily. -     Comprehensive metabolic panel -     CBC with Differential/Platelet  Cirrhosis of liver without ascites, unspecified hepatic cirrhosis type (HCC) -     AFP tumor marker  Colon cancer screening -     Fecal occult blood, imunochemical  Autoimmune hepatitis ([place])  ABNORMAL TRANSAMINASE-LFT'S  Thrombocytopenia ([place])   I discussed with Ms. Timothy [name] and for now we will switch the patient under my primary care service for now  We will see the patient back in short-term follow-up after he gets back on his medications and connect him with financial services   Past Medical History:  Diagnosis Date  . Autoimmune hepatitis ([place])   . Cirrhosis of liver ([place])   . Hypertension   . Thrombocytopenia ([place])      Family History  Problem Relation Age of Onset  . Hypertension Sister      Social History   Socioeconomic History  . Marital status: Single    Spouse name: Not on file  . Number of children: Not on file  . Years of education: Not on file  . Highest education level: Not on file  Occupational History  . Not on  file  Tobacco Use  . Smoking status: Never Smoker  . Smokeless tobacco: Never Used  Vaping Use  . Vaping Use: Never used  Substance and Sexual Activity  . Alcohol use: No    Alcohol/week: 0.0 standard drinks  . Drug use: No  . Sexual activity: Yes  Other Topics Concern  . Not on file  Social History Narrative  . Not on file   Social Determinants of Health   Financial Resource Strain: Not on file  Food Insecurity: Not on file  Transportation Needs: Not on file  Physical Activity: Not on file  Stress: Not on file  Social Connections: Not on file  Intimate Partner Violence: Not on file     Allergies  Allergen Reactions  . Nsaids     thrombocytopenia     Outpatient Medications Prior to  Visit  Medication Sig Dispense Refill  . chlorthalidone (HYGROTON) 25 MG tablet TAKE 1 TABLET (25 MG TOTAL) BY MOUTH DAILY. 30 tablet 0  . losartan (COZAAR) 50 MG tablet Take 1 tablet (50 mg total) by mouth daily. 30 tablet 3  . nebivolol (BYSTOLIC) 10 MG tablet Take 1 tablet (10 mg total) by mouth daily. 30 tablet 3   No facility-administered medications prior to visit.      Review of Systems  Respiratory: Negative for shortness of breath and wheezing.   Cardiovascular: Negative for chest pain.  Neurological: Negative for headaches.       Objective:   Physical Exam There were no vitals filed for this visit.  Gen: Pleasant, well-nourished, in no distress,  normal affect  ENT: No lesions,  mouth clear,  oropharynx clear, no postnasal drip  Neck: No JVD, no TMG, no carotid bruits  Lungs: No use of accessory muscles, no dullness to percussion, clear without rales or rhonchi  Cardiovascular: RRR, heart sounds normal, no murmur or gallops, no peripheral edema  Abdomen: soft and NT, no HSM,  BS normal  Musculoskeletal: No deformities, no cyanosis or clubbing  Neuro: alert, non focal  Skin: Warm, no lesions or rashes  No results found.        Assessment & Plan:  I  personally reviewed all images and lab data in the [facility] system as well as any outside material available during this office visit and agree with the  radiology impressions.   No problem-specific Assessment & Plan notes found for this encounter.   There are no diagnoses linked to this encounter. I discussed with Ms. Timothy [name] and for now we will switch the patient under my primary care service for now  We will see the patient back in short-term follow-up after he gets back on his medications and connect him with financial services

## 2020-10-21 ENCOUNTER — Encounter: Payer: Self-pay | Admitting: Critical Care Medicine

## 2020-10-21 ENCOUNTER — Other Ambulatory Visit: Payer: Self-pay

## 2020-10-21 ENCOUNTER — Other Ambulatory Visit: Payer: Self-pay | Admitting: Critical Care Medicine

## 2020-10-21 ENCOUNTER — Ambulatory Visit: Payer: Self-pay | Attending: Critical Care Medicine | Admitting: Critical Care Medicine

## 2020-10-21 DIAGNOSIS — Z91199 Patient's noncompliance with other medical treatment and regimen due to unspecified reason: Secondary | ICD-10-CM

## 2020-10-21 DIAGNOSIS — Z5329 Procedure and treatment not carried out because of patient's decision for other reasons: Secondary | ICD-10-CM

## 2020-10-21 MED FILL — Losartan Potassium Tab 50 MG: ORAL | 30 days supply | Qty: 30 | Fill #0 | Status: AC

## 2020-10-21 NOTE — Progress Notes (Signed)
Patient ID: Timothy [name], male   DOB: [birth year], 60 y.o.   MRN: [id number] The pt was a no show

## 2020-10-21 NOTE — Telephone Encounter (Signed)
Requested medication (s) are due for refill today: yes  Requested medication (s) are on the active medication list:  yes  Last refill: 09/13/2020  Future visit scheduled: no  Notes to clinic:  overdue for office visit    Requested Prescriptions  Pending Prescriptions Disp Refills   chlorthalidone (HYGROTON) 25 MG tablet 30 tablet 0    Sig: TAKE 1 TABLET (25 MG TOTAL) BY MOUTH DAILY.      Cardiovascular: Diuretics - Thiazide Failed - 10/21/2020 10:07 AM      Failed - Ca in normal range and within 360 days    No results found for: CALCIUM, CORRECTEDCA, CAWHOLEBLD, POCCA, CALION, CAION, POCICA        Failed - Cr in normal range and within 360 days    No results found for: CREATININE, LABCREAU, LABCREA, POCCRE        Failed - K in normal range and within 360 days    No results found for: K, POTASSIUM, POCK        Failed - Na in normal range and within 360 days    No results found for: NA, POCNA        Failed - Last BP in normal range    BP Readings from Last 1 Encounters:  No data found for BP          Failed - Valid encounter within last 6 months    Recent Outpatient Visits   None

## 2020-10-22 ENCOUNTER — Other Ambulatory Visit: Payer: Self-pay

## 2020-10-24 ENCOUNTER — Other Ambulatory Visit: Payer: Self-pay

## 2020-10-30 ENCOUNTER — Other Ambulatory Visit: Payer: Self-pay

## 2020-11-21 ENCOUNTER — Ambulatory Visit: Payer: Self-pay | Admitting: Critical Care Medicine

## 2020-11-21 NOTE — Progress Notes (Deleted)
Subjective:    Patient ID: Timothy [name], male    DOB: February 02, 1961, 60 y.o.   MRN: [id number]  60 y.o. M Pcp Pt of [name]  Hx HTN, cirrhosis, autoimmune hepatitis, thrombocytopenia   07/30/2020 This patient presents today requesting to change to a different primary care provider.  He was seen by way of a telehealth visit in November 2021 as documented below. Last seen by telehealth visit 05/2020: He has liver cirrhosis. Has been advised during numerous office visits and tele visits to apply for the financial assistance program. He has been referred to hepatology specialist and gastroenterology and he has not responded to their requests to set up an appointment.  He was treated in the past for Autoimmune hepatitis in 2009 but unable to recall his treatment. His platelets are consistently 70-80s. Per Hematology he was instructed to return in 6 months for follow up and treatment in 2019. He has been lost to follow up. PER Oncology note 11-10-2017: -If his platelets continue to trend downwards <30K with no bleeding or <50K with concerns for bleeding- I would recommend Nplate injections  Today he has no complaints aside from symptoms of seasonal allergies including nasal congestion, rhinorrhea and dry itchy eyes. Onset of symptoms was a few weeks ago.   Essential Hypertension Notes Home readings: 136-138/80s. Denies chest pain, shortness of breath, palpitations, lightheadedness, dizziness, headaches or BLE edema. Currently taking losartan 50 mg daily, bystolic 10 mg daily and chlorthalidone 25 mg daily as prescribed.   This patient on arrival has a blood pressure 152/111.  He just ran out of his chlorthalidone and losartan and has a few Bystolic left. Patient has history of autoimmune hepatitis in my look in his record he was followed in [place] at the hepatology clinic up to 2009.  At that time he was on azathioprine for his inflammation of the liver.  He has since developed liver cirrhosis.  He is no  longer on the azathioprine. Patient does have chronic thrombocytopenia with of the liver disease.  He does not have hepatitis C that is active at this time apparently was treated for this in the past but it is unclear to me whether he was fully treated or not  Patient has been offered on numerous occasions financial assistance has never been able to follow through and complete the applications and obtain the assistance.  He was seen by hematology for his thrombocytopenia and they indicated as noted above that he might require treatment of his platelet counts fell further.  He denies any active bleeding at this time.  Initially when I saw the patient today I thought it was for a blood pressure check but he then described to me that he wished to change to a different provider.  I did confer with his previous provider [name] on this issue.  Patient does have home blood pressure readings and he states they have been lower than those in here in the office but when he is on all of his medications his blood pressures are quite well controlled  Note the patient is due a Covid booster he is also due a colon cancer screen Patient is also due a tetanus vaccine and a flu vaccine he did except both of these on arrival  11/21/2020  Essential hypertension Hypertension not well controlled due to lack of access to medications he has run out of his medications I will plan to refill his losartan chlorthalidone and Bystolic  Indicated to him if he can  get the blue card he can get 90-day supplies of his medicines and his cost will be lowered to him for his pharmaceuticals  Patient was adamant he had tried and failed several times and was not been to pursue the financial assistance I told him he must do this if he is to get good access to care  He also needs a referral back to hematology and gastroenterology  Autoimmune hepatitis Plan will be to follow-up his liver function alpha-fetoprotein levels hepatitis  C levels I need to get him into gastroenterology but cannot do this until we get financial assistance squared away  Hepatitis C Need to assess current state of his cirrhosis and hepatitis C  Liver cirrhosis ([place]) As per above assessments  ABNORMAL TRANSAMINASE-LFT'S Check liver function at this visit  Thrombocytopenia ([place]) Repeat platelet count   Olen was seen today for medication refill and hypertension.  Diagnoses and all orders for this visit:  Hepatitis C virus infection without hepatic coma, unspecified chronicity -     HCV Ab w Reflex to Quant PCR -     HCV RNA quant  Essential hypertension -     losartan (COZAAR) 50 MG tablet; Take 1 tablet (50 mg total) by mouth daily. -     chlorthalidone (HYGROTON) 25 MG tablet; Take 1 tablet (25 mg total) by mouth daily. -     nebivolol (BYSTOLIC) 10 MG tablet; Take 1 tablet (10 mg total) by mouth daily. -     Comprehensive metabolic panel -     CBC with Differential/Platelet  Cirrhosis of liver without ascites, unspecified hepatic cirrhosis type (HCC) -     AFP tumor marker  Colon cancer screening -     Fecal occult blood, imunochemical  Autoimmune hepatitis ([place])  ABNORMAL TRANSAMINASE-LFT'S  Thrombocytopenia ([place])   I discussed with [name] and for now we will switch the patient under my primary care service for now   Past Medical History:  Diagnosis Date  . Autoimmune hepatitis ([place])   . Cirrhosis of liver ([place])   . Hypertension   . Thrombocytopenia ([place])      Family History  Problem Relation Age of Onset  . Hypertension Sister      Social History   Socioeconomic History  . Marital status: Single    Spouse name: Not on file  . Number of children: Not on file  . Years of education: Not on file  . Highest education level: Not on file  Occupational History  . Not on file  Tobacco Use  . Smoking status: Never Smoker  . Smokeless tobacco: Never Used  Vaping Use  . Vaping Use: Never used   Substance and Sexual Activity  . Alcohol use: No    Alcohol/week: 0.0 standard drinks  . Drug use: No  . Sexual activity: Yes  Other Topics Concern  . Not on file  Social History Narrative  . Not on file   Social Determinants of Health   Financial Resource Strain: Not on file  Food Insecurity: Not on file  Transportation Needs: Not on file  Physical Activity: Not on file  Stress: Not on file  Social Connections: Not on file  Intimate Partner Violence: Not on file     Allergies  Allergen Reactions  . Nsaids     thrombocytopenia     Outpatient Medications Prior to Visit  Medication Sig Dispense Refill  . chlorthalidone (HYGROTON) 25 MG tablet TAKE 1 TABLET (25 MG TOTAL) BY MOUTH DAILY. [place]  tablet 0  . chlorthalidone (HYGROTON) 25 MG tablet TAKE 1 TABLET (25 MG TOTAL) BY MOUTH DAILY. 30 tablet 0  . chlorthalidone (HYGROTON) 25 MG tablet TAKE 1 TABLET (25 MG TOTAL) BY MOUTH DAILY. 30 tablet 0  . chlorthalidone (HYGROTON) 25 MG tablet TAKE 1 TABLET (25 MG TOTAL) BY MOUTH DAILY. 30 tablet 0  . chlorthalidone (HYGROTON) 25 MG tablet TAKE 1 TABLET (25 MG TOTAL) BY MOUTH DAILY. 30 tablet 0  . losartan (COZAAR) 50 MG tablet Take 1 tablet (50 mg total) by mouth daily. 30 tablet 3  . losartan (COZAAR) 50 MG tablet TAKE 1 TABLET (50 MG TOTAL) BY MOUTH DAILY. 30 tablet 3  . losartan (COZAAR) 50 MG tablet TAKE 1 TABLET (50 MG TOTAL) BY MOUTH DAILY. 30 tablet 0  . losartan (COZAAR) 50 MG tablet TAKE 1 TABLET (50 MG TOTAL) BY MOUTH DAILY. 30 tablet 0  . nebivolol (BYSTOLIC) 10 MG tablet Take 1 tablet (10 mg total) by mouth daily. 30 tablet 3  . nebivolol (BYSTOLIC) 10 MG tablet TAKE 1 TABLET (10 MG TOTAL) BY MOUTH DAILY. 30 tablet 3   No facility-administered medications prior to visit.      Review of Systems  Respiratory: Negative for shortness of breath and wheezing.   Cardiovascular: Negative for chest pain.  Neurological: Negative for headaches.       Objective:   Physical  Exam There were no vitals filed for this visit.  Gen: Pleasant, well-nourished, in no distress,  normal affect  ENT: No lesions,  mouth clear,  oropharynx clear, no postnasal drip  Neck: No JVD, no TMG, no carotid bruits  Lungs: No use of accessory muscles, no dullness to percussion, clear without rales or rhonchi  Cardiovascular: RRR, heart sounds normal, no murmur or gallops, no peripheral edema  Abdomen: soft and NT, no HSM,  BS normal  Musculoskeletal: No deformities, no cyanosis or clubbing  Neuro: alert, non focal  Skin: Warm, no lesions or rashes  No results found.        Assessment & Plan:  I personally reviewed all images and lab data in the [facility] system as well as any outside material available during this office visit and agree with the  radiology impressions.   No problem-specific Assessment & Plan notes found for this encounter.   There are no diagnoses linked to this encounter. I discussed with [name] and for now we will switch the patient under my primary care service for now  We will see the patient back in short-term follow-up after he gets back on his medications and connect him with financial services

## 2020-11-26 ENCOUNTER — Other Ambulatory Visit: Payer: Self-pay

## 2020-11-26 MED FILL — Losartan Potassium Tab 50 MG: ORAL | 30 days supply | Qty: 30 | Fill #1 | Status: AC

## 2020-11-28 ENCOUNTER — Other Ambulatory Visit: Payer: Self-pay

## 2020-11-28 MED FILL — Nebivolol HCl Tab 10 MG (Base Equivalent): ORAL | 90 days supply | Qty: 90 | Fill #0 | Status: AC

## 2020-12-03 ENCOUNTER — Other Ambulatory Visit: Payer: Self-pay

## 2020-12-19 ENCOUNTER — Other Ambulatory Visit: Payer: Self-pay

## 2021-02-28 ENCOUNTER — Other Ambulatory Visit: Payer: Self-pay

## 2021-03-03 ENCOUNTER — Other Ambulatory Visit: Payer: Self-pay

## 2021-03-03 MED FILL — Nebivolol HCl Tab 10 MG (Base Equivalent): ORAL | 30 days supply | Qty: 30 | Fill #1 | Status: AC

## 2021-03-05 ENCOUNTER — Other Ambulatory Visit: Payer: Self-pay

## 2021-06-09 ENCOUNTER — Ambulatory Visit: Payer: Self-pay

## 2021-06-09 NOTE — Telephone Encounter (Signed)
Pt called with complaint of swollen ankles.  Pt has hx of swollen ankles and HTN.  Pt has been out of his water pills for 6 months, and unable to see PCP due to finances.  Pt is now employed.  Pt reports no pain, fever, redness or warmth on legs.  Per protocol, pt should be seen w/in 3 days no appts available.  Pt will go to UC or ed if difficulty breathing, fever, redness or warmth in legs.  Please advise.      Reason for Disposition  Swollen ankle joint  (Exception: area of localized swelling which is itchy)  Answer Assessment - Initial Assessment Questions 1. LOCATION: "Which ankle is swollen?" "Where is the swelling?"     Both ankles 2. ONSET: "When did the swelling start?"     A few months ago 3. SIZE: "How large is the swelling?"     moderate 4. PAIN: "Is there any pain?" If Yes, ask: "How bad is it?" (Scale 1-10; or mild, moderate, severe)   - NONE (0): no pain.   - MILD (1-3): doesn't interfere with normal activities.    - MODERATE (4-7): interferes with normal activities (e.g., work or school) or awakens from sleep, limping.    - SEVERE (8-10): excruciating pain, unable to do any normal activities, unable to walk.      none 5. CAUSE: "What do you think caused the ankle swelling?"     HTN - no water pills 6. OTHER SYMPTOMS: "Do you have any other symptoms?" (e.g., fever, chest pain, difficulty breathing, calf pain)     no 7. PREGNANCY: "Is there any chance you are pregnant?" "When was your last menstrual period?"     na  Protocols used: Ankle Swelling-A-AH

## 2021-06-09 NOTE — Telephone Encounter (Signed)
Timothy [name]  Work on getting him in with me ok to double book

## 2021-06-10 ENCOUNTER — Ambulatory Visit: Payer: Self-pay | Attending: Critical Care Medicine | Admitting: Critical Care Medicine

## 2021-06-10 ENCOUNTER — Other Ambulatory Visit: Payer: Self-pay

## 2021-06-10 ENCOUNTER — Encounter: Payer: Self-pay | Admitting: Critical Care Medicine

## 2021-06-10 VITALS — BP 180/117 | HR 72 | Resp 16 | Wt 230.2 lb

## 2021-06-10 DIAGNOSIS — D696 Thrombocytopenia, unspecified: Secondary | ICD-10-CM

## 2021-06-10 DIAGNOSIS — Z139 Encounter for screening, unspecified: Secondary | ICD-10-CM

## 2021-06-10 DIAGNOSIS — I10 Essential (primary) hypertension: Secondary | ICD-10-CM

## 2021-06-10 DIAGNOSIS — Z1211 Encounter for screening for malignant neoplasm of colon: Secondary | ICD-10-CM

## 2021-06-10 DIAGNOSIS — K754 Autoimmune hepatitis: Secondary | ICD-10-CM

## 2021-06-10 DIAGNOSIS — R7401 Elevation of levels of liver transaminase levels: Secondary | ICD-10-CM

## 2021-06-10 DIAGNOSIS — K7460 Unspecified cirrhosis of liver: Secondary | ICD-10-CM

## 2021-06-10 DIAGNOSIS — B1920 Unspecified viral hepatitis C without hepatic coma: Secondary | ICD-10-CM

## 2021-06-10 DIAGNOSIS — R7402 Elevation of levels of lactic acid dehydrogenase (LDH): Secondary | ICD-10-CM

## 2021-06-10 DIAGNOSIS — R188 Other ascites: Secondary | ICD-10-CM

## 2021-06-10 MED ORDER — VALSARTAN 320 MG PO TABS
320.0000 mg | ORAL_TABLET | Freq: Every day | ORAL | 3 refills | Status: DC
Start: 2021-06-10 — End: 2021-12-15
  Filled 2021-06-10: qty 30, 30d supply, fill #0
  Filled 2021-07-07: qty 30, 30d supply, fill #1

## 2021-06-10 MED ORDER — SPIRONOLACTONE 50 MG PO TABS
50.0000 mg | ORAL_TABLET | Freq: Every day | ORAL | 2 refills | Status: DC
  Filled 2021-06-10: qty 30, 30d supply, fill #0
  Filled 2021-07-07: qty 30, 30d supply, fill #1
  Filled 2021-08-06: qty 30, 30d supply, fill #2
  Filled 2021-08-06: qty 30, 30d supply, fill #0
  Filled 2021-09-08: qty 30, 30d supply, fill #1
  Filled 2021-10-06: qty 30, 30d supply, fill #2
  Filled 2021-11-11: qty 30, 30d supply, fill #3
  Filled 2021-12-15: qty 30, 30d supply, fill #4
  Filled 2022-01-22: qty 30, 30d supply, fill #5

## 2021-06-10 MED ORDER — NEBIVOLOL HCL 20 MG PO TABS
20.0000 mg | ORAL_TABLET | Freq: Every day | ORAL | 1 refills | Status: DC
  Filled 2021-06-10: qty 30, 30d supply, fill #0

## 2021-06-10 NOTE — Progress Notes (Signed)
Established Patient Office Visit  Subjective:  Patient ID: Timothy [name], male    DOB: 10/20/1960  Age: 60 y.o. MRN: [id number]  CC:  Chief Complaint  Patient presents with   Hypertension   Foot Swelling    HPI Timothy [name] presents for primary care follow-up.  Patient has not been seen since a primary care visit in January 2022.  He missed his follow-up visit in April.  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  He does not drink alcohol.  Patient states has had increasing abdominal girth and swelling.  Patient states he denies any nausea or vomiting.  Note on arrival blood pressure is elevated 180/117.  He is only been taking Bystolic 10 mg daily.  He works [employer] at night.  He is not compliant with a low-salt diet at this time.  The patient declined a flu vaccine and will give consideration to a pneumonia vaccine  Past Medical History:  Diagnosis Date   Autoimmune hepatitis ([place])    Cirrhosis of liver ([place])    Hypertension    Thrombocytopenia ([place])     History reviewed. No pertinent surgical history.  Family History  Problem Relation Age of Onset   Hypertension Sister     Social History   Socioeconomic History   Marital status: Single    Spouse name: Not on file   Number of children: Not on file   Years of education: Not on file   Highest education level: Not on file  Occupational History   Not on file  Tobacco Use   Smoking status: Never   Smokeless tobacco: Never  Vaping Use   Vaping Use: Never used  Substance and Sexual Activity   Alcohol use: No    Alcohol/week: 0.0 standard drinks   Drug use: No   Sexual activity: Yes  Other Topics Concern   Not on file  Social History Narrative   Not on file   Social Determinants of Health   Financial Resource Strain: Not on file  Food Insecurity: Not on file  Transportation Needs: Not on file  Physical Activity: Not  on file  Stress: Not on file  Social Connections: Not on file  Intimate Partner Violence: Not on file    Outpatient Medications Prior to Visit  Medication Sig Dispense Refill   chlorthalidone (HYGROTON) 25 MG tablet TAKE 1 TABLET (25 MG TOTAL) BY MOUTH DAILY. 30 tablet 0   chlorthalidone (HYGROTON) 25 MG tablet TAKE 1 TABLET (25 MG TOTAL) BY MOUTH DAILY. 30 tablet 0   chlorthalidone (HYGROTON) 25 MG tablet TAKE 1 TABLET (25 MG TOTAL) BY MOUTH DAILY. 30 tablet 0   losartan (COZAAR) 50 MG tablet TAKE 1 TABLET (50 MG TOTAL) BY MOUTH DAILY. 30 tablet 3   losartan (COZAAR) 50 MG tablet TAKE 1 TABLET (50 MG TOTAL) BY MOUTH DAILY. 30 tablet 0   nebivolol (BYSTOLIC) 10 MG tablet Take 1 tablet (10 mg total) by mouth daily. 30 tablet 3   chlorthalidone (HYGROTON) 25 MG tablet TAKE 1 TABLET (25 MG TOTAL) BY MOUTH DAILY. 30 tablet 0   chlorthalidone (HYGROTON) 25 MG tablet TAKE 1 TABLET (25 MG TOTAL) BY MOUTH DAILY. 30 tablet 0   losartan (COZAAR) 50 MG tablet Take 1 tablet (50 mg total) by mouth daily. 30 tablet 3   losartan (COZAAR) 50 MG tablet TAKE 1 TABLET (50 MG TOTAL) BY MOUTH DAILY. 30 tablet 0   nebivolol (  BYSTOLIC) 10 MG tablet TAKE 1 TABLET (10 MG TOTAL) BY MOUTH DAILY. (Patient not taking: Reported on 06/10/2021) 30 tablet 3   No facility-administered medications prior to visit.    Allergies  Allergen Reactions   Nsaids     thrombocytopenia    ROS Review of Systems  Constitutional: Negative.  Negative for chills, diaphoresis and fever.  HENT: Negative.  Negative for congestion, ear pain, hearing loss, nosebleeds, postnasal drip, rhinorrhea, sinus pressure, sore throat, tinnitus, trouble swallowing and voice change.   Eyes: Negative.  Negative for photophobia, redness and visual disturbance.  Respiratory: Negative.  Negative for apnea, cough, choking, chest tightness, shortness of breath, wheezing and stridor.   Cardiovascular:  Positive for leg swelling. Negative for chest pain and  palpitations.  Gastrointestinal:  Positive for abdominal distention. Negative for abdominal pain, blood in stool, constipation, diarrhea, nausea and vomiting.  Endocrine: Negative for polydipsia.  Genitourinary: Negative.  Negative for difficulty urinating, dysuria, flank pain, frequency, hematuria and urgency.  Musculoskeletal: Negative.  Negative for arthralgias, back pain, myalgias and neck pain.  Skin: Negative.  Negative for rash.  Allergic/Immunologic: Negative.  Negative for environmental allergies and food allergies.  Neurological:  Positive for headaches. Negative for dizziness, tremors, seizures, syncope and weakness.  Hematological: Negative.  Negative for adenopathy. Does not bruise/bleed easily.  Psychiatric/Behavioral: Negative.  Negative for agitation, hallucinations, self-injury, sleep disturbance and suicidal ideas. The patient is not nervous/anxious.      Objective:    Physical Exam Vitals reviewed.  Constitutional:      Appearance: Normal appearance. He is well-developed. He is obese. He is not diaphoretic.  HENT:     Head: Normocephalic and atraumatic.     Nose: Nose normal. No nasal deformity, septal deviation, mucosal edema or rhinorrhea.     Right Sinus: No maxillary sinus tenderness or frontal sinus tenderness.     Left Sinus: No maxillary sinus tenderness or frontal sinus tenderness.     Mouth/Throat:     Mouth: Mucous membranes are moist.     Pharynx: Oropharynx is clear. No oropharyngeal exudate.  Eyes:     General: No scleral icterus.    Conjunctiva/sclera: Conjunctivae normal.     Pupils: Pupils are equal, round, and reactive to light.  Neck:     Thyroid: No thyromegaly.     Vascular: No carotid bruit or JVD.     Trachea: Trachea normal. No tracheal tenderness or tracheal deviation.  Cardiovascular:     Rate and Rhythm: Normal rate and regular rhythm.     Chest Wall: PMI is not displaced.     Pulses: Normal pulses. No decreased pulses.     Heart  sounds: Normal heart sounds, S1 normal and S2 normal. Heart sounds not distant. No murmur heard. No systolic murmur is present.  No diastolic murmur is present.    No friction rub. No gallop. No S3 or S4 sounds.  Pulmonary:     Effort: Pulmonary effort is normal. No tachypnea, accessory muscle usage or respiratory distress.     Breath sounds: Normal breath sounds. No stridor. No decreased breath sounds, wheezing, rhonchi or rales.  Chest:     Chest wall: No tenderness.  Abdominal:     General: Bowel sounds are normal. There is distension.     Palpations: Abdomen is soft. Abdomen is not rigid.     Tenderness: There is no abdominal tenderness. There is no guarding or rebound.     Comments: Significant fluid wave felt compatible with ascites  Musculoskeletal:        General: Normal range of motion.     Cervical back: Normal range of motion and neck supple. No edema, erythema or rigidity. No muscular tenderness. Normal range of motion.  Lymphadenopathy:     Head:     Right side of head: No submental or submandibular adenopathy.     Left side of head: No submental or submandibular adenopathy.     Cervical: No cervical adenopathy.  Skin:    General: Skin is warm and dry.     Coloration: Skin is not pale.     Findings: No rash.     Nails: There is no clubbing.  Neurological:     Mental Status: He is alert and oriented to person, place, and time.     Sensory: No sensory deficit.  Psychiatric:        Mood and Affect: Mood normal.        Speech: Speech normal.        Behavior: Behavior normal.        Thought Content: Thought content normal.        Judgment: Judgment normal.    BP (!) 180/117 (BP Location: Right Arm, Patient Position: Sitting)   Pulse 72   Resp 16   Wt 230 lb 3.2 oz (104.4 kg)   SpO2 98%   BMI 29.96 kg/m  Wt Readings from Last 3 Encounters:  06/10/21 230 lb 3.2 oz (104.4 kg)  07/30/20 234 lb (106.1 kg)  12/29/19 224 lb 9.6 oz (101.9 kg)     Health Maintenance  Due  Topic Date Due   Pneumococcal Vaccine 36-41 Years old (1 - PCV) Never done   Zoster Vaccines- Shingrix (1 of 2) Never done   COLON CANCER SCREENING ANNUAL FOBT  07/18/2020    There are no preventive care reminders to display for this patient.  Lab Results  Component Value Date   TSH 3.050 11/25/2016   Lab Results  Component Value Date   WBC 4.3 07/30/2020   HGB 13.6 07/30/2020   HCT 40.3 07/30/2020   MCV 84 07/30/2020   PLT 63 (LL) 07/30/2020   Lab Results  Component Value Date   NA 142 07/30/2020   K 4.0 07/30/2020   CHLORIDE 111 (H) 01/27/2016   CO2 25 07/30/2020   GLUCOSE 142 (H) 07/30/2020   BUN 16 07/30/2020   CREATININE 1.27 07/30/2020   BILITOT 0.9 07/30/2020   ALKPHOS 108 07/30/2020   AST 70 (H) 07/30/2020   ALT 36 07/30/2020   PROT 7.7 07/30/2020   ALBUMIN 3.2 (L) 07/30/2020   CALCIUM 9.4 07/30/2020   ANIONGAP 7 01/27/2016   EGFR 79 (L) 01/27/2016   Lab Results  Component Value Date   CHOL 159 06/11/2020   Lab Results  Component Value Date   HDL 54 06/11/2020   Lab Results  Component Value Date   LDLCALC 92 06/11/2020   Lab Results  Component Value Date   TRIG 64 06/11/2020   Lab Results  Component Value Date   CHOLHDL 2.9 06/11/2020   Lab Results  Component Value Date   HGBA1C 5.9 (H) 06/11/2020      Assessment & Plan:   Problem List Items Addressed This Visit       Cardiovascular and Mediastinum   Essential hypertension    Hypertension poorly controlled plan to increase Bystolic to 20 mg daily and begin Aldactone 50 mg daily while discontinuing chlorthalidone and losartan.  We will also begin valsartan  320 mg daily.  Will check complete metabolic panel at this visit and have educated the patient as to low-salt diet  The patient need to come back short-term follow-up 1 month      Relevant Medications   Nebivolol HCl (BYSTOLIC) 20 MG TABS   valsartan (DIOVAN) 320 MG tablet   spironolactone (ALDACTONE) 50 MG tablet    Other Relevant Orders   CBC with Differential/Platelet   Thyroid Panel With TSH     Digestive   Autoimmune hepatitis ([place])    Not currently active      Liver cirrhosis ([place]) - Primary    I am concerned cirrhotic liver disease considered is contributing to ascites and fluid retention      Relevant Orders   Comprehensive metabolic panel   CBC with Differential/Platelet   Hepatitis C    Not currently active        Hematopoietic and Hemostatic   Thrombocytopenia ([place])    Follow-up blood counts        Other   ABNORMAL TRANSAMINASE-LFT'S    Follow-up liver function      Other Visit Diagnoses     Encounter for health-related screening       Relevant Orders   Lipid panel   Colon cancer screening       Relevant Orders   Fecal occult blood, imunochemical       Meds ordered this encounter  Medications   Nebivolol HCl (BYSTOLIC) 20 MG TABS    Sig: Take 1 tablet (20 mg total) by mouth daily.    Dispense:  90 tablet    Refill:  1    Dose change   valsartan (DIOVAN) 320 MG tablet    Sig: Take 1 tablet (320 mg total) by mouth daily.    Dispense:  90 tablet    Refill:  3   spironolactone (ALDACTONE) 50 MG tablet    Sig: Take 1 tablet (50 mg total) by mouth daily.    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    Asencion Noble, MD

## 2021-06-10 NOTE — Assessment & Plan Note (Signed)
Follow-up blood counts

## 2021-06-10 NOTE — Patient Instructions (Signed)
Increase Bystolic to 20 mg daily you can take 2 of your current 10 mg tablets and take them all at once daily but the new prescription will be a single 20 mg pill to take daily  Stop losartan and chlorthalidone  Begin valsartan 1 daily and spironolactone 1 daily both for blood pressure and fluid retention  Full set of screening labs obtained today  Colon cancer kit given to you today please process bring it back so we can screen you for colon cancer  Pneumonia vaccine was given  You declined the flu vaccine  Return to see Dr. Joya Gaskins 6 weeks  Follow a healthy diet as we discussed see attachment

## 2021-06-10 NOTE — Assessment & Plan Note (Signed)
I am concerned cirrhotic liver disease considered is contributing to ascites and fluid retention

## 2021-06-10 NOTE — Assessment & Plan Note (Signed)
Follow-up liver function 

## 2021-06-10 NOTE — Assessment & Plan Note (Signed)
Not currently active.  

## 2021-06-10 NOTE — Assessment & Plan Note (Addendum)
Hypertension poorly controlled plan to increase Bystolic to 20 mg daily and begin Aldactone 50 mg daily while discontinuing chlorthalidone and losartan.  We will also begin valsartan 320 mg daily.  Will check complete metabolic panel at this visit and have educated the patient as to low-salt diet  The patient need to come back short-term follow-up 1 month

## 2021-06-11 ENCOUNTER — Other Ambulatory Visit: Payer: Self-pay | Admitting: Critical Care Medicine

## 2021-06-11 ENCOUNTER — Telehealth: Payer: Self-pay

## 2021-06-11 DIAGNOSIS — K7460 Unspecified cirrhosis of liver: Secondary | ICD-10-CM

## 2021-06-11 DIAGNOSIS — R188 Other ascites: Secondary | ICD-10-CM

## 2021-06-11 LAB — COMPREHENSIVE METABOLIC PANEL
ALT: 20 IU/L (ref 0–44)
AST: 42 IU/L — ABNORMAL HIGH (ref 0–40)
Albumin/Globulin Ratio: 0.7 — ABNORMAL LOW (ref 1.2–2.2)
Albumin: 3 g/dL — ABNORMAL LOW (ref 3.8–4.8)
Alkaline Phosphatase: 90 IU/L (ref 44–121)
BUN/Creatinine Ratio: 13 (ref 10–24)
BUN: 15 mg/dL (ref 8–27)
Bilirubin Total: 0.8 mg/dL (ref 0.0–1.2)
CO2: 25 mmol/L (ref 20–29)
Calcium: 8.6 mg/dL (ref 8.6–10.2)
Chloride: 107 mmol/L — ABNORMAL HIGH (ref 96–106)
Creatinine, Ser: 1.16 mg/dL (ref 0.76–1.27)
Globulin, Total: 4.1 g/dL (ref 1.5–4.5)
Glucose: 105 mg/dL — ABNORMAL HIGH (ref 70–99)
Potassium: 4.1 mmol/L (ref 3.5–5.2)
Sodium: 139 mmol/L (ref 134–144)
Total Protein: 7.1 g/dL (ref 6.0–8.5)
eGFR: 72 mL/min/{1.73_m2} (ref >59)

## 2021-06-11 LAB — CBC WITH DIFFERENTIAL/PLATELET
Basophils Absolute: 0.1 10*3/uL (ref 0.0–0.2)
Basos: 1 %
EOS (ABSOLUTE): 0.7 10*3/uL — ABNORMAL HIGH (ref 0.0–0.4)
Eos: 15 %
Hematocrit: 40.3 % (ref 37.5–51.0)
Hemoglobin: 12.9 g/dL — ABNORMAL LOW (ref 13.0–17.7)
Immature Grans (Abs): 0 10*3/uL (ref 0.0–0.1)
Immature Granulocytes: 0 %
Lymphocytes Absolute: 2.1 10*3/uL (ref 0.7–3.1)
Lymphs: 41 %
MCH: 28 pg (ref 26.6–33.0)
MCHC: 32 g/dL (ref 31.5–35.7)
MCV: 87 fL (ref 79–97)
Monocytes Absolute: 0.6 10*3/uL (ref 0.1–0.9)
Monocytes: 12 %
Neutrophils Absolute: 1.5 10*3/uL (ref 1.4–7.0)
Neutrophils: 31 %
Platelets: 80 10*3/uL — CL (ref 150–450)
RBC: 4.61 x10E6/uL (ref 4.14–5.80)
RDW: 14.5 % (ref 11.6–15.4)
WBC: 5 10*3/uL (ref 3.4–10.8)

## 2021-06-11 LAB — LIPID PANEL
Chol/HDL Ratio: 3.3 ratio (ref 0.0–5.0)
Cholesterol, Total: 153 mg/dL (ref 100–199)
HDL: 46 mg/dL (ref >39)
LDL Chol Calc (NIH): 94 mg/dL (ref 0–99)
Triglycerides: 66 mg/dL (ref 0–149)
VLDL Cholesterol Cal: 13 mg/dL (ref 5–40)

## 2021-06-11 LAB — THYROID PANEL WITH TSH
Free Thyroxine Index: 2 (ref 1.2–4.9)
T3 Uptake Ratio: 30 % (ref 24–39)
T4, Total: 6.8 ug/dL (ref 4.5–12.0)
TSH: 1.94 u[IU]/mL (ref 0.450–4.500)

## 2021-06-11 NOTE — Telephone Encounter (Signed)
-----   Message from Elsie Stain, MD sent at 06/11/2021 12:13 PM EST ----- Let pt know kidney normal    he has findings in his blood work concerning for liver disease so I am referring him to a liver specialist, cholesterol normal, thyroid normal

## 2021-06-11 NOTE — Telephone Encounter (Signed)
Pt was called and vm was left, Information has been sent to nurse pool and letter will be sent.

## 2021-06-16 ENCOUNTER — Telehealth: Payer: Self-pay | Admitting: Critical Care Medicine

## 2021-06-16 NOTE — Telephone Encounter (Signed)
Pt having side effects from meds.  Call him and get him in with either me or any provider to assess ASAP

## 2021-06-18 NOTE — Telephone Encounter (Signed)
Called pt and left vm mobile unit or getting an appointment with [place]

## 2021-06-23 ENCOUNTER — Other Ambulatory Visit: Payer: Self-pay

## 2021-07-08 ENCOUNTER — Other Ambulatory Visit: Payer: Self-pay

## 2021-07-17 ENCOUNTER — Telehealth: Payer: Self-pay

## 2021-07-17 NOTE — Telephone Encounter (Signed)
Call placed to patient and Vm was left informing patient to return phone call.  Ok to place on for 07/21/2021 at 9:00

## 2021-07-17 NOTE — Telephone Encounter (Signed)
Pt stated he wanted a later appt than 9am, please advise if there is a later appt open

## 2021-07-17 NOTE — Telephone Encounter (Signed)
-----   Message from Elsie Stain, MD sent at 07/17/2021  8:53 AM EST ----- Regarding: needs appt Needs face to face add on appt for next week ok to double

## 2021-07-18 NOTE — Telephone Encounter (Signed)
Had to push him out a week, the times he was available we are already double book but appointment was made.

## 2021-07-21 ENCOUNTER — Telehealth: Payer: Self-pay

## 2021-07-21 NOTE — Telephone Encounter (Signed)
-----   Message from Elsie Stain, MD sent at 07/17/2021  8:53 AM EST ----- Regarding: needs appt Needs face to face add on appt for next week ok to double

## 2021-07-21 NOTE — Telephone Encounter (Signed)
Pt already has appointment set for 07/29/2021 at 11:00 am.

## 2021-07-29 ENCOUNTER — Ambulatory Visit: Payer: Self-pay | Attending: Critical Care Medicine | Admitting: Critical Care Medicine

## 2021-07-29 NOTE — Progress Notes (Incomplete)
Established Patient Office Visit  Subjective:  Patient ID: Airam Heidecker, male    DOB: 1960/11/10  Age: 61 y.o. MRN: [id number]  CC: No chief complaint on file.   HPI Junius Finner presents for ***  Past Medical History:  Diagnosis Date   Autoimmune hepatitis ([place])    Cirrhosis of liver ([place])    Hypertension    Thrombocytopenia ([place])     No past surgical history on file.  Family History  Problem Relation Age of Onset   Hypertension Sister     Social History   Socioeconomic History   Marital status: Single    Spouse name: Not on file   Number of children: Not on file   Years of education: Not on file   Highest education level: Not on file  Occupational History   Not on file  Tobacco Use   Smoking status: Never   Smokeless tobacco: Never  Vaping Use   Vaping Use: Never used  Substance and Sexual Activity   Alcohol use: No    Alcohol/week: 0.0 standard drinks   Drug use: No   Sexual activity: Yes  Other Topics Concern   Not on file  Social History Narrative   Not on file   Social Determinants of Health   Financial Resource Strain: Not on file  Food Insecurity: Not on file  Transportation Needs: Not on file  Physical Activity: Not on file  Stress: Not on file  Social Connections: Not on file  Intimate Partner Violence: Not on file    Outpatient Medications Prior to Visit  Medication Sig Dispense Refill   Nebivolol HCl (BYSTOLIC) 20 MG TABS Take 1 tablet (20 mg total) by mouth daily. 90 tablet 1   spironolactone (ALDACTONE) 50 MG tablet Take 1 tablet (50 mg total) by mouth daily. 90 tablet 2   valsartan (DIOVAN) 320 MG tablet Take 1 tablet (320 mg total) by mouth daily. 90 tablet 3   No facility-administered medications prior to visit.    Allergies  Allergen Reactions   Nsaids     thrombocytopenia    ROS Review of Systems    Objective:    Physical Exam  There were no vitals taken for this visit. Wt Readings from Last 3  Encounters:  06/10/21 230 lb 3.2 oz (104.4 kg)  07/30/20 234 lb (106.1 kg)  12/29/19 224 lb 9.6 oz (101.9 kg)     Health Maintenance Due  Topic Date Due   Zoster Vaccines- Shingrix (1 of 2) Never done   COLON CANCER SCREENING ANNUAL FOBT  07/18/2020    There are no preventive care reminders to display for this patient.  Lab Results  Component Value Date   TSH 1.940 06/10/2021   Lab Results  Component Value Date   WBC 5.0 06/10/2021   HGB 12.9 (L) 06/10/2021   HCT 40.3 06/10/2021   MCV 87 06/10/2021   PLT 80 (LL) 06/10/2021   Lab Results  Component Value Date   NA 139 06/10/2021   K 4.1 06/10/2021   CHLORIDE 111 (H) 01/27/2016   CO2 25 06/10/2021   GLUCOSE 105 (H) 06/10/2021   BUN 15 06/10/2021   CREATININE 1.16 06/10/2021   BILITOT 0.8 06/10/2021   ALKPHOS 90 06/10/2021   AST 42 (H) 06/10/2021   ALT 20 06/10/2021   PROT 7.1 06/10/2021   ALBUMIN 3.0 (L) 06/10/2021   CALCIUM 8.6 06/10/2021   ANIONGAP 7 01/27/2016   EGFR 72 06/10/2021   Lab Results  Component Value  Date   CHOL 153 06/10/2021   Lab Results  Component Value Date   HDL 46 06/10/2021   Lab Results  Component Value Date   LDLCALC 94 06/10/2021   Lab Results  Component Value Date   TRIG 66 06/10/2021   Lab Results  Component Value Date   CHOLHDL 3.3 06/10/2021   Lab Results  Component Value Date   HGBA1C 5.9 (H) 06/11/2020      Assessment & Plan:   Problem List Items Addressed This Visit   None   No orders of the defined types were placed in this encounter.   Follow-up: No follow-ups on file.    Asencion Noble, MD

## 2021-08-06 ENCOUNTER — Other Ambulatory Visit: Payer: Self-pay

## 2021-08-08 ENCOUNTER — Other Ambulatory Visit: Payer: Self-pay

## 2021-09-08 ENCOUNTER — Other Ambulatory Visit: Payer: Self-pay

## 2021-09-09 ENCOUNTER — Other Ambulatory Visit: Payer: Self-pay

## 2021-09-26 ENCOUNTER — Ambulatory Visit (HOSPITAL_COMMUNITY)
Admission: EM | Admit: 2021-09-26 | Discharge: 2021-09-26 | Disposition: A | Discharge status: Home / Self Care | Payer: 59 | Attending: Nurse Practitioner | Admitting: Nurse Practitioner

## 2021-09-26 ENCOUNTER — Encounter (HOSPITAL_COMMUNITY): Payer: Self-pay

## 2021-09-26 ENCOUNTER — Other Ambulatory Visit: Payer: Self-pay

## 2021-09-26 DIAGNOSIS — J069 Acute upper respiratory infection, unspecified: Secondary | ICD-10-CM | POA: Diagnosis not present

## 2021-09-26 DIAGNOSIS — U071 COVID-19: Secondary | ICD-10-CM | POA: Insufficient documentation

## 2021-09-26 NOTE — ED Provider Notes (Signed)
?[place] ? ? ? ?CSN: [id number] ?Arrival date & time: 09/26/21  0941 ? ? ?  ? ?History   ?Chief Complaint ?No chief complaint on file. ? ? ?HPI ?Timothy [name] is a 61 y.o. male.  ? ?Patient reports sore throat started about 3 days ago.  He also endorses occasional congested cough, body aches, and chills.  He denies shortness of breath, wheezing, chest pain or tightness, sinus pressure, headache, ear pain or drainage, nausea, vomiting, diarrhea, change in appetite.  He has taken Advil sinus with relief of congestion.  He is requesting COVID testing today because he has a coworker who tested positive. ? ?Reports chronic sinus issues, they are stable right now. ? ? ? ? ?Past Medical History:  ?Diagnosis Date  ? Autoimmune hepatitis ([place])   ? Cirrhosis of liver ([place])   ? Hypertension   ? Thrombocytopenia ([place])   ? ? ?Patient Active Problem List  ? Diagnosis Date Noted  ? Thrombocytopenia ([place]) 10/24/2015  ? Liver cirrhosis ([place]) 10/24/2015  ? Hepatitis C 10/24/2015  ? Autoimmune hepatitis ([place]) 03/28/2013  ? Essential hypertension 11/15/2012  ? ABNORMAL TRANSAMINASE-LFT'S 12/23/2007  ? ? ?History reviewed. No pertinent surgical history. ? ? ? ? ?Home Medications   ? ?Prior to Admission medications   ?Medication Sig Start Date End Date Taking? Authorizing Provider  ?Nebivolol HCl (BYSTOLIC) 20 MG TABS Take 1 tablet (20 mg total) by mouth daily. 06/10/21   Elsie Stain, MD  ?spironolactone (ALDACTONE) 50 MG tablet Take 1 tablet (50 mg total) by mouth daily. 06/10/21   Elsie Stain, MD  ?valsartan (DIOVAN) 320 MG tablet Take 1 tablet (320 mg total) by mouth daily. 06/10/21   Elsie Stain, MD  ? ? ?Family History ?Family History  ?Problem Relation Age of Onset  ? Hypertension Sister   ? ? ?Social History ?Social History  ? ?Tobacco Use  ? Smoking status: Never  ? Smokeless tobacco: Never  ?Vaping Use  ? Vaping Use: Never used  ?Substance Use Topics  ? Alcohol use: No  ?  Alcohol/week: 0.0 standard  drinks  ? Drug use: No  ? ? ? ?Allergies   ?Nsaids ? ? ?Review of Systems ?Review of Systems ?Per HPI ? ?Physical Exam ?Triage Vital Signs ?ED Triage Vitals  ?Enc Vitals Group  ?   BP 09/26/21 1104 (!) 156/75  ?   Pulse Rate 09/26/21 1104 64  ?   Resp --   ?   Temp 09/26/21 1104 97.9 ?F (36.6 ?C)  ?   Temp Source 09/26/21 1104 Oral  ?   SpO2 09/26/21 1104 100 %  ?   Weight --   ?   Height --   ?   Head Circumference --   ?   Peak Flow --   ?   Pain Score 09/26/21 1124 0  ?   Pain Loc --   ?   Pain Edu? --   ?   Excl. in [place]? --   ? ?No data found. ? ?Updated Vital Signs ?BP (!) 156/75 (BP Location: Left Arm)   Pulse 64   Temp 97.9 ?F (36.6 ?C) (Oral)   SpO2 100%  ? ?Visual Acuity ?Right Eye Distance:   ?Left Eye Distance:   ?Bilateral Distance:   ? ?Right Eye Near:   ?Left Eye Near:    ?Bilateral Near:    ? ?Physical Exam ?Vitals and nursing note reviewed.  ?Constitutional:   ?   General: He  is not in acute distress. ?   Appearance: Normal appearance. He is not toxic-appearing.  ?HENT:  ?   Head: Normocephalic and atraumatic.  ?   Right Ear: There is impacted cerumen.  ?   Left Ear: There is impacted cerumen.  ?   Nose: Nose normal. No congestion or rhinorrhea.  ?   Mouth/Throat:  ?   Mouth: Mucous membranes are moist.  ?   Pharynx: Oropharynx is clear. No oropharyngeal exudate or posterior oropharyngeal erythema.  ?Eyes:  ?   General: No scleral icterus. ?   Extraocular Movements: Extraocular movements intact.  ?Cardiovascular:  ?   Rate and Rhythm: Normal rate and regular rhythm.  ?Pulmonary:  ?   Effort: Pulmonary effort is normal. No respiratory distress.  ?   Breath sounds: Normal breath sounds. No wheezing, rhonchi or rales.  ?Musculoskeletal:  ?   Cervical back: Normal range of motion.  ?Lymphadenopathy:  ?   Cervical: Cervical adenopathy present.  ?Skin: ?   General: Skin is warm and dry.  ?   Capillary Refill: Capillary refill takes less than 2 seconds.  ?   Coloration: Skin is not jaundiced or pale.  ?    Findings: No erythema or rash.  ?Neurological:  ?   Mental Status: He is alert and oriented to person, place, and time.  ?   Motor: No weakness.  ?   Gait: Gait normal.  ?Psychiatric:     ?   Mood and Affect: Mood normal.     ?   Behavior: Behavior normal.     ?   Thought Content: Thought content normal.     ?   Judgment: Judgment normal.  ? ? ? ?UC Treatments / Results  ?Labs ?(all labs ordered are listed, but only abnormal results are displayed) ?Labs Reviewed  ?SARS CORONAVIRUS 2 (TAT 6-24 HRS)  ? ? ?EKG ? ? ?Radiology ?No results found. ? ?Procedures ?Procedures (including critical care time) ? ?Medications Ordered in UC ?Medications - No data to display ? ?Initial Impression / Assessment and Plan / UC Course  ?I have reviewed the triage vital signs and the nursing notes. ? ?Pertinent labs & imaging results that were available during my care of the patient were reviewed by me and considered in my medical decision making (see chart for details). ? ?  ?Reassured patient that symptoms and exam findings are most consistent with a viral upper respiratory infection.  Discussed expected course and features suggestive of secondary bacterial infection.  Continue supportive care. Increase fluid intake with water or electrolyte solution like pedialyte. Encouraged acetaminophen as needed for fever/pain. Encouraged salt water gargling, chloraseptic spray and throat lozenges. Encouraged OTC guaifenesin. Encouraged saline sinus flushes and/or neti with humidified air.  Stay out of work until [place] results return - note given.   ? ?Final Clinical Impressions(s) / UC Diagnoses  ? ?Final diagnoses:  ?Viral URI with cough  ? ? ? ?Discharge Instructions   ? ?  ?- Please stay out of work until you know your COVID-19 test results ?Your symptoms and exam findings are most consistent with a viral upper respiratory infection. These usually run their course in 5-7 days.  If your symptoms last longer than 10 days and/or you start feeling  worse with facial pain, high fever, cough, shortness of breath or start feeling significantly worse, please seek emergent care.  ?Some things that can make you feel better are: ?- Increased rest ?- Increasing fluid with water/sugar free electrolytes ?-  Acetaminophen as needed for fever/pain.  ?- Salt water gargling, chloraseptic spray and throat lozenges ?- OTC guaifenesin (Mucinex).  ?- Saline sinus flushes or a neti pot.  ?- Humidifying the air. ? ? ? ? ? ?ED Prescriptions   ?None ?  ? ?PDMP not reviewed this encounter. ?  ?Eulogio Bear, NP ?09/26/21 1212 ? ?

## 2021-09-26 NOTE — Discharge Instructions (Addendum)
-   Please stay out of work until you know your COVID-19 test results ?Your symptoms and exam findings are most consistent with a viral upper respiratory infection. These usually run their course in 5-7 days.  If your symptoms last longer than 10 days and/or you start feeling worse with facial pain, high fever, cough, shortness of breath or start feeling significantly worse, please seek emergent care.  ?Some things that can make you feel better are: ?- Increased rest ?- Increasing fluid with water/sugar free electrolytes ?- Acetaminophen as needed for fever/pain.  ?- Salt water gargling, chloraseptic spray and throat lozenges ?- OTC guaifenesin (Mucinex).  ?- Saline sinus flushes or a neti pot.  ?- Humidifying the air. ? ?

## 2021-09-26 NOTE — ED Triage Notes (Signed)
Pt presents with sore throat. He would like a covid test, his co-worker tested positive for covid. ?

## 2021-09-27 LAB — SARS CORONAVIRUS 2 (TAT 6-24 HRS): SARS Coronavirus 2: POSITIVE — AB

## 2021-10-06 ENCOUNTER — Other Ambulatory Visit: Payer: Self-pay

## 2021-10-07 ENCOUNTER — Other Ambulatory Visit: Payer: Self-pay

## 2021-10-13 ENCOUNTER — Telehealth: Payer: Self-pay | Admitting: Pharmacist

## 2021-10-13 NOTE — Patient Outreach (Signed)
Patient appearing on report for [facility] - Hypertension Control report due to last documented ambulatory blood pressure of 180/117 on 06/10/2021. Next appointment with PCP is not scheduled   ? ?Outreached patient to discuss hypertension control and medication management.  ? ?Current medications: nebivolol 20 mg daily, spironolactone 50 mg daily, not taking valsartan due to diarrhea, perceived lack of benefit - last filled 1/3 for a 30 day supply ? ?Patient has an automated upper arm home BP machine. ? ?Current blood pressure readings: 120-140s, generally 130s, does not remember diastolic readings ? ?Current meal patterns: uses a low sodium seasoning; discussed lean proteins, vegetables, focusing on drinking more water and minimizing juices  ? 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 Has a pedal machine ? ?Patient denies hypotensive signs and symptoms including dizziness, lightheadedness.  ?Patient denies hypertensive symptoms including headache, chest pain, shortness of breath ? ?Patient denies side effects related to nebivolol and spironolactone   ? ?Medications Reviewed Today   ? ? Reviewed by Osker Mason, RPH-CPP (Pharmacist) on 10/13/21 at 1510  Med List Status: <None>  ? ?Medication Order Taking? Sig Documenting Provider Last Dose Status Informant  ?Nebivolol HCl (BYSTOLIC) 20 MG TABS [id number] Yes Take 1 tablet (20 mg total) by mouth daily. Elsie Stain, MD Taking Active   ?spironolactone (ALDACTONE) 50 MG tablet [id number] Yes Take 1 tablet (50 mg total) by mouth daily. Elsie Stain, MD Taking Active   ?valsartan (DIOVAN) 320 MG tablet [id number] No Take 1 tablet (320 mg total) by mouth daily.  ?Patient not taking: Reported on 10/13/2021  ? Elsie Stain, MD Not Taking Active   ? ?  ?  ? ?  ? ? ? ?Assessment/Plan: ?- Currently uncontrolled ?- - Reviewed goal blood pressure <130/80 ?- Reviewed appropriate  administration of medication regimen ?- Counseled on long term microvascular and macrovascular complications of uncontrolled hypertension ?- Reviewed appropriate home BP monitoring technique (avoid caffeine, smoking, and exercise for 30 minutes before checking, rest for at least 5 minutes before taking BP, sit with feet flat on the floor and back against a hard surface, uncross legs, and rest arm on flat surface) ?- Reviewed to check blood pressure daily, document, and provide at next provider visit ?- Discussed dietary modifications, such as reduced salt intake, focus on whole grains, vegetables, lean proteins. Discussed non-sugary flavoring packets that he could add to his water ?- Discussed eventual goal of 150 minutes of moderate intensity physical activity weekly. ?- Patient notes he needs a refill on Bystolic. Sent refill request to embedded pharmacist.  ?- Scheduled follow up with embedded pharmacist.  ? ?Catie Hedwig Morton, PharmD, BCACP ?[place] ?[phone] ? ? ?

## 2021-10-13 NOTE — Patient Instructions (Signed)
Mr. Shughart,  ? ?It was great to talk to you today! ? ?Check your blood pressure daily . Our goal is less than 130/80 ? ?We recommend a blood pressure cuff that goes around your upper arm, as these are generally the most accurate.  ? ?To appropriately check your blood pressure, make sure you do the following:  ?1) Avoid caffeine, exercise, or tobacco products for 30 minutes before checking. ?2) Sit with your back supported in a flat-backed chair. Rest your arm on something flat (arm of the chair, table, etc). ?3) Sit still with your feet flat on the floor, resting, for at least 5 minutes.  ?4) Check your blood pressure. Take 1-2 readings.  ? ?Write down these readings and bring with you to any provider appointments. Bring your home blood pressure machine with you to a provider's office for accuracy comparison at least once a year. ? ?Take care! ? ?Catie Hedwig Morton, PharmD, BCACP ?[place] ?[phone] ? ?

## 2021-10-15 ENCOUNTER — Other Ambulatory Visit: Payer: Self-pay | Admitting: Pharmacist

## 2021-10-15 ENCOUNTER — Other Ambulatory Visit: Payer: Self-pay

## 2021-10-15 DIAGNOSIS — I10 Essential (primary) hypertension: Secondary | ICD-10-CM

## 2021-10-15 MED ORDER — NEBIVOLOL HCL 20 MG PO TABS
20.0000 mg | ORAL_TABLET | Freq: Every day | ORAL | 1 refills | Status: DC
  Filled 2021-10-15: qty 90, 90d supply, fill #0

## 2021-10-21 ENCOUNTER — Other Ambulatory Visit: Payer: Self-pay

## 2021-10-23 ENCOUNTER — Other Ambulatory Visit: Payer: Self-pay

## 2021-10-24 ENCOUNTER — Other Ambulatory Visit: Payer: Self-pay

## 2021-10-24 ENCOUNTER — Ambulatory Visit: Payer: Self-pay

## 2021-10-24 NOTE — Telephone Encounter (Signed)
?  Chief Complaint: Medication dosage increase ?Symptoms: none ?Frequency: na ?Pertinent Negatives: Patient denies  ?Disposition: '[]'$ ED /'[]'$ Urgent Care (no appt availability in office) / '[]'$ Appointment(In office/virtual)/ '[]'$  [facility]/ '[]'$ Home Care/ '[]'$ Refused Recommended Disposition /'[]'$ [facility] [facility]/ '[x]'$  Follow-up with PCP ?Additional Notes: Pt picked up new supply of medication Bystolic and the medication is now 20 mg. Pt states that he was never told to increase this medication to '20mg'$ . Per chart the medication was increased during OV of 06/10/2022.  ?Pt states that his blood pressures are good at the 10 mg. Pt stated that BP's are in the 120's over 80's. His highest was 128/82. His most recent is 123/80.  Pt states he is taking his BP everyday. ?Pt would like to know why he was not told about the increase in dosage, and why it was increased at all. He states that he told Dr. Joya Gaskins that he was in a hurry and the reading taken during the OV was abnormally high for him.  ?Please return call to pt.  ? ? ? ?Summary: Medication MG Increase Advice  ? Pt is calling to ask why his medication was increased. Pt reports that Brooke Currain increased his Bystolic '10mg'$  to Rx #: [id number] Nebivolol HCl (BYSTOLIC) 20 MG TABS [[id number]]  ?Pt reports that his BP in 123/80 which is good. And he would like to know why the medication was increased '10mg'$ ?   ?  ? ?Reason for Disposition ? [1] Caller has NON-URGENT medicine question about med that PCP prescribed AND [2] triager unable to answer question ? ?Answer Assessment - Initial Assessment Questions ?1. NAME of MEDICATION: "What medicine are you calling about?" ?    Bystolic '20mg'$  ?2. QUESTION: "What is your question?" (e.g., double dose of medicine, side effect) ?    Pt now being prescribed 20 mg instead of '10mg'$  ?3. PRESCRIBING HCP: "Who prescribed it?" Reason: if prescribed by specialist, call should be referred to that group. ?    Dr. Joya Gaskins ?4. SYMPTOMS: "Do  you have any symptoms?" ?    no ?5. SEVERITY: If symptoms are present, ask "Are they mild, moderate or severe?" ?    na ?6. PREGNANCY:  "Is there any chance that you are pregnant?" "When was your last menstrual period?" ?    na ? ?Protocols used: Medication Question Call-A-AH ? ?

## 2021-10-27 NOTE — Telephone Encounter (Signed)
Called pt but couldn't leave vm  ?

## 2021-11-11 ENCOUNTER — Other Ambulatory Visit: Payer: Self-pay

## 2021-11-12 ENCOUNTER — Other Ambulatory Visit: Payer: Self-pay

## 2021-11-12 NOTE — Progress Notes (Unsigned)
? ?  S:    ?Timothy [name] is a 61 y.o. male who presents for hypertension evaluation, education, and management. PMH is significant for HTN. Patient was last seen by Primary Care Provider, Dr. Joya Gaskins, on 06/10/21. At last visit, BP was 180/117.  ? ?Today, patient arrives in *** spirits and presents {w-w/o:315700} assistance. *** Denies dizziness, headache, blurred vision, swelling.  ?***not taking valsartan (diarrhea?) ?-past meds: chlorthalidone, hctz, losartan, amlodipine? ? ?Patient reports hypertension was diagnosed in ***.  ? ?Family/Social history:  ?-Never smoker ?-HTN in sister ? ?Medication adherence *** . Patient has *** taken BP medications today.  ? ?Current antihypertensives include: spironolactone 50 mg daily, nebivolol 20 mg daily ? ?Antihypertensives tried in the past include: valsartan (diarrhea) ? ?Reported home BP readings: *** ? ?Patient reported dietary habits: Eats *** meals/day ?Breakfast: *** ?Lunch: *** ?Dinner: *** ?Snacks: *** ?Drinks: *** ? ?Patient-reported exercise habits: *** ? ?ASCVD risk factors include: HTN ? ?O:  ? ?Last 3 Office BP readings: ?BP Readings from Last 3 Encounters:  ?09/26/21 (!) 156/75  ?06/10/21 (!) 180/117  ?07/30/20 (!) 152/111  ? ? ?BMET ?   ?Component Value Date/Time  ? NA 139 06/10/2021 1157  ? NA 141 01/27/2016 1005  ? K 4.1 06/10/2021 1157  ? K 4.1 01/27/2016 1005  ? CL 107 (H) 06/10/2021 1157  ? CO2 25 06/10/2021 1157  ? CO2 24 01/27/2016 1005  ? GLUCOSE 105 (H) 06/10/2021 1157  ? GLUCOSE 98 01/27/2016 1005  ? BUN 15 06/10/2021 1157  ? BUN 15.0 01/27/2016 1005  ? CREATININE 1.16 06/10/2021 1157  ? CREATININE 1.2 01/27/2016 1005  ? CALCIUM 8.6 06/10/2021 1157  ? CALCIUM 8.8 01/27/2016 1005  ? GFRNONAA 61 07/30/2020 1148  ? GFRAA 71 07/30/2020 1148  ? ? ?Renal function: ?CrCl cannot be calculated (Patient's most recent lab result is older than the maximum 21 days allowed.). ? ?Clinical ASCVD: No  ?The 10-year ASCVD risk score (Arnett DK, et al., 2019) is: 18.7% ?   Values used to calculate the score: ?    Age: 30 years ?    Sex: Male ?    Is Non-Hispanic African American: Yes ?    Diabetic: No ?    Tobacco smoker: No ?    Systolic Blood Pressure: 109 mmHg ?    Is BP treated: Yes ?    HDL Cholesterol: 46 mg/dL ?    Total Cholesterol: 153 mg/dL ? ?A/P: ?Hypertension diagnosed *** currently *** on current medications. BP goal < 130/80 *** mmHg. Medication adherence appears ***. Control is suboptimal due to ***.  ?-{Meds adjust:18428} ***.  ?-Patient educated on purpose, proper use, and potential adverse effects of ***.  ?-F/u labs ordered - *** ?-Counseled on lifestyle modifications for blood pressure control including reduced dietary sodium, increased exercise, adequate sleep. ?-Encouraged patient to check BP at home and bring log of readings to next visit. Counseled on proper use of home BP cuff.  ? ?Results reviewed and written information provided. Patient verbalized understanding of treatment plan. Total time in face-to-face counseling *** minutes.  ? ?F/u clinic visit in ***.  ? ?

## 2021-11-17 ENCOUNTER — Ambulatory Visit: Payer: 59 | Admitting: Pharmacist

## 2021-12-15 ENCOUNTER — Encounter: Payer: Self-pay | Admitting: Pharmacist

## 2021-12-15 ENCOUNTER — Other Ambulatory Visit: Payer: Self-pay | Admitting: Pharmacist

## 2021-12-15 ENCOUNTER — Telehealth: Payer: Self-pay

## 2021-12-15 ENCOUNTER — Other Ambulatory Visit: Payer: Self-pay

## 2021-12-15 ENCOUNTER — Other Ambulatory Visit: Payer: Self-pay | Admitting: Critical Care Medicine

## 2021-12-15 DIAGNOSIS — D696 Thrombocytopenia, unspecified: Secondary | ICD-10-CM

## 2021-12-15 DIAGNOSIS — I10 Essential (primary) hypertension: Secondary | ICD-10-CM

## 2021-12-15 MED ORDER — NEBIVOLOL HCL 20 MG PO TABS: 10.0000 mg | ORAL_TABLET | Freq: Every day | ORAL | 1 refills | Status: DC

## 2021-12-15 NOTE — Patient Instructions (Signed)
Mr. Jowett,   It was great talking to you today!  Please talk to Dr. Joya Gaskins about the Shingrix (shingles) vaccine and the Tdap vaccine.  Check your blood pressure once daily, and any time you have concerning symptoms like headache, chest pain, dizziness, shortness of breath, or vision changes.   Our goal is less than 130/80.  To appropriately check your blood pressure, make sure you do the following:  1) Avoid caffeine, exercise, or tobacco products for 30 minutes before checking. Empty your bladder. 2) Sit with your back supported in a flat-backed chair. Rest your arm on something flat (arm of the chair, table, etc). 3) Sit still with your feet flat on the floor, resting, for at least 5 minutes.  4) Check your blood pressure. Take 1-2 readings.  5) Write down these readings and bring with you to any provider appointments.  Bring your home blood pressure machine with you to a provider's office for accuracy comparison at least once a year.   Make sure you take your blood pressure medications before you come to any office visit, even if you were asked to fast for labs.  Take care!  Catie Hedwig Morton, PharmD, [facility] [phone]

## 2021-12-15 NOTE — Telephone Encounter (Signed)
At request of Catie Jodi Mourning [facility], I contacted patient about dental resources.  He explained that he no longer has [employer] and is in need of having 2 teeth replaced.  He is working and said that he has money for the procedure as long as it is a reasonable cost. A payment plan would be best.  He called [facility] and received an estimate of $500. I provided him with the phone number for [facility]. He said he would call them. I explained that they may not offer the service he is looking for but may be able to make a suggestion for another provider.  I sent a message to Maren Reamer, [facility] Referral Coordinator inquiring about other possible options for the patient.

## 2021-12-15 NOTE — Chronic Care Management (AMB) (Signed)
Patient appearing on report for [facility] - Hypertension Control report due to last documented ambulatory blood pressure of 180/117 on 06/10/21. Next appointment with PCP is 01/27/22   Outreached patient to discuss hypertension control and medication management.   Current antihypertensives: nebivolol 10 mg (cutting tablet in half), spironolactone 50 mg daily; self discontinued valsartan due to diarrhea.   Patient has an automated upper arm home BP machine.  Current blood pressure readings: 110-120s/80s at home.    Patient denies hypotensive signs and symptoms including dizziness, lightheadedness.  Patient denies hypertensive symptoms including headache, chest pain, shortness of breath.  Patient denies side effects related to nebivolol or spironolactone   Assessment/Plan: - Currently controlled - - Reviewed goal blood pressure <130/80 - Reviewed to check blood pressure daily, document, and provide at next provider visit   Patient notes he is uninsured, lost [employer] coverage. He is interested in dental work - referral placed for [facility]. Also will notify PCP.   Catie Hedwig Morton, PharmD, [facility] [phone]

## 2021-12-17 ENCOUNTER — Other Ambulatory Visit: Payer: Self-pay

## 2021-12-17 NOTE — Telephone Encounter (Signed)
Message received from Maren Reamer, [facility] Referral Coordinator stating that the only recommendation she has for the dental procedure patient is requesting would be [facility].    I called the patient and shared the above noted information with him.  He said he called [facility] and they have no payment plan, the cost is $475 for xrays and exam and then $1900 for each tooth.  He will now contact [facility] to schedule his procedures.  He said that [facility] is able to do all of the work in 1 day.

## 2021-12-25 ENCOUNTER — Other Ambulatory Visit: Payer: Self-pay

## 2022-01-22 ENCOUNTER — Other Ambulatory Visit: Payer: Self-pay

## 2022-01-23 ENCOUNTER — Other Ambulatory Visit: Payer: Self-pay

## 2022-01-26 NOTE — Progress Notes (Signed)
Established Patient Office Visit  Subjective:  Patient ID: Timothy [name], male    DOB: 1960/10/02  Age: 61 y.o. MRN: [id number]  CC:  No chief complaint on file.   HPI 06/2021 Junius Finner presents for primary care follow-up.  Patient has not been seen since a primary care visit in January 2022.  He missed his follow-up visit in April.  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  He does not drink alcohol.  Patient states has had increasing abdominal girth and swelling.  Patient states he denies any nausea or vomiting.  Note on arrival blood pressure is elevated 180/117.  He is only been taking Bystolic 10 mg daily.  He works [employer] at night.  He is not compliant with a low-salt diet at this time.  The patient declined a flu vaccine and will give consideration to a pneumonia vaccine  7/25 12/2021 cpp Current antihypertensives: nebivolol 10 mg (cutting tablet in half), spironolactone 50 mg daily; self discontinued valsartan due to diarrhea.    Patient has an automated upper arm home BP machine.   Current blood pressure readings: 110-120s/80s at home.      Patient denies hypotensive signs and symptoms including dizziness, lightheadedness.  Patient denies hypertensive symptoms including headache, chest pain, shortness of breath.   Patient denies side effects related to nebivolol or spironolactone    Assessment/Plan: - Currently controlled - - Reviewed goal blood pressure <130/80 - Reviewed to check blood pressure daily, document, and provide at next provider visit     Patient notes he is uninsured, lost [employer] coverage. He is interested in dental work - referral placed for [facility]. Also will notify PCP Past Medical History:  Diagnosis Date  . Autoimmune hepatitis ([place])   . Cirrhosis of liver ([place])   . Hypertension   . Thrombocytopenia ([place])     No past surgical  history on file.  Family History  Problem Relation Age of Onset  . Hypertension Sister     Social History   Socioeconomic History  . Marital status: Single    Spouse name: Not on file  . Number of children: Not on file  . Years of education: Not on file  . Highest education level: Not on file  Occupational History  . Not on file  Tobacco Use  . Smoking status: Never  . Smokeless tobacco: Never  Vaping Use  . Vaping Use: Never used  Substance and Sexual Activity  . Alcohol use: No    Alcohol/week: 0.0 standard drinks of alcohol  . Drug use: No  . Sexual activity: Yes  Other Topics Concern  . Not on file  Social History Narrative  . Not on file   Social Determinants of Health   Financial Resource Strain: Not on file  Food Insecurity: Not on file  Transportation Needs: Not on file  Physical Activity: Not on file  Stress: Not on file  Social Connections: Not on file  Intimate Partner Violence: Not on file    Outpatient Medications Prior to Visit  Medication Sig Dispense Refill  . Nebivolol HCl (BYSTOLIC) 20 MG TABS Take 0.5 tablets (10 mg total) by mouth daily. 90 tablet 1  . spironolactone (ALDACTONE) 50 MG tablet Take 1 tablet (50 mg total) by mouth daily. 90 tablet 2   No facility-administered medications prior to visit.    Allergies  Allergen Reactions  . Nsaids     thrombocytopenia  ROS Review of Systems  Constitutional: Negative.  Negative for chills, diaphoresis and fever.  HENT: Negative.  Negative for congestion, ear pain, hearing loss, nosebleeds, postnasal drip, rhinorrhea, sinus pressure, sore throat, tinnitus, trouble swallowing and voice change.   Eyes: Negative.  Negative for photophobia, redness and visual disturbance.  Respiratory: Negative.  Negative for apnea, cough, choking, chest tightness, shortness of breath, wheezing and stridor.   Cardiovascular:  Positive for leg swelling. Negative for chest pain and palpitations.   Gastrointestinal:  Positive for abdominal distention. Negative for abdominal pain, blood in stool, constipation, diarrhea, nausea and vomiting.  Endocrine: Negative for polydipsia.  Genitourinary: Negative.  Negative for difficulty urinating, dysuria, flank pain, frequency, hematuria and urgency.  Musculoskeletal: Negative.  Negative for arthralgias, back pain, myalgias and neck pain.  Skin: Negative.  Negative for rash.  Allergic/Immunologic: Negative.  Negative for environmental allergies and food allergies.  Neurological:  Positive for headaches. Negative for dizziness, tremors, seizures, syncope and weakness.  Hematological: Negative.  Negative for adenopathy. Does not bruise/bleed easily.  Psychiatric/Behavioral: Negative.  Negative for agitation, hallucinations, self-injury, sleep disturbance and suicidal ideas. The patient is not nervous/anxious.       Objective:    Physical Exam Vitals reviewed.  Constitutional:      Appearance: Normal appearance. He is well-developed. He is obese. He is not diaphoretic.  HENT:     Head: Normocephalic and atraumatic.     Nose: Nose normal. No nasal deformity, septal deviation, mucosal edema or rhinorrhea.     Right Sinus: No maxillary sinus tenderness or frontal sinus tenderness.     Left Sinus: No maxillary sinus tenderness or frontal sinus tenderness.     Mouth/Throat:     Mouth: Mucous membranes are moist.     Pharynx: Oropharynx is clear. No oropharyngeal exudate.  Eyes:     General: No scleral icterus.    Conjunctiva/sclera: Conjunctivae normal.     Pupils: Pupils are equal, round, and reactive to light.  Neck:     Thyroid: No thyromegaly.     Vascular: No carotid bruit or JVD.     Trachea: Trachea normal. No tracheal tenderness or tracheal deviation.  Cardiovascular:     Rate and Rhythm: Normal rate and regular rhythm.     Chest Wall: PMI is not displaced.     Pulses: Normal pulses. No decreased pulses.     Heart sounds: Normal  heart sounds, S1 normal and S2 normal. Heart sounds not distant. No murmur heard.    No systolic murmur is present.     No diastolic murmur is present.     No friction rub. No gallop. No S3 or S4 sounds.  Pulmonary:     Effort: Pulmonary effort is normal. No tachypnea, accessory muscle usage or respiratory distress.     Breath sounds: Normal breath sounds. No stridor. No decreased breath sounds, wheezing, rhonchi or rales.  Chest:     Chest wall: No tenderness.  Abdominal:     General: Bowel sounds are normal. There is distension.     Palpations: Abdomen is soft. Abdomen is not rigid.     Tenderness: There is no abdominal tenderness. There is no guarding or rebound.     Comments: Significant fluid wave felt compatible with ascites  Musculoskeletal:        General: Normal range of motion.     Cervical back: Normal range of motion and neck supple. No edema, erythema or rigidity. No muscular tenderness. Normal range of motion.  Lymphadenopathy:     Head:     Right side of head: No submental or submandibular adenopathy.     Left side of head: No submental or submandibular adenopathy.     Cervical: No cervical adenopathy.  Skin:    General: Skin is warm and dry.     Coloration: Skin is not pale.     Findings: No rash.     Nails: There is no clubbing.  Neurological:     Mental Status: He is alert and oriented to person, place, and time.     Sensory: No sensory deficit.  Psychiatric:        Mood and Affect: Mood normal.        Speech: Speech normal.        Behavior: Behavior normal.        Thought Content: Thought content normal.        Judgment: Judgment normal.    There were no vitals taken for this visit. Wt Readings from Last 3 Encounters:  06/10/21 230 lb 3.2 oz (104.4 kg)  07/30/20 234 lb (106.1 kg)  12/29/19 224 lb 9.6 oz (101.9 kg)     Health Maintenance Due  Topic Date Due  . TETANUS/TDAP  Never done  . Zoster Vaccines- Shingrix (1 of 2) Never done  . COLON CANCER  SCREENING ANNUAL FOBT  07/18/2020    There are no preventive care reminders to display for this patient.  Lab Results  Component Value Date   TSH 1.940 06/10/2021   Lab Results  Component Value Date   WBC 5.0 06/10/2021   HGB 12.9 (L) 06/10/2021   HCT 40.3 06/10/2021   MCV 87 06/10/2021   PLT 80 (LL) 06/10/2021   Lab Results  Component Value Date   NA 139 06/10/2021   K 4.1 06/10/2021   CHLORIDE 111 (H) 01/27/2016   CO2 25 06/10/2021   GLUCOSE 105 (H) 06/10/2021   BUN 15 06/10/2021   CREATININE 1.16 06/10/2021   BILITOT 0.8 06/10/2021   ALKPHOS 90 06/10/2021   AST 42 (H) 06/10/2021   ALT 20 06/10/2021   PROT 7.1 06/10/2021   ALBUMIN 3.0 (L) 06/10/2021   CALCIUM 8.6 06/10/2021   ANIONGAP 7 01/27/2016   EGFR 72 06/10/2021   Lab Results  Component Value Date   CHOL 153 06/10/2021   Lab Results  Component Value Date   HDL 46 06/10/2021   Lab Results  Component Value Date   LDLCALC 94 06/10/2021   Lab Results  Component Value Date   TRIG 66 06/10/2021   Lab Results  Component Value Date   CHOLHDL 3.3 06/10/2021   Lab Results  Component Value Date   HGBA1C 5.9 (H) 06/11/2020      Assessment & Plan:   Problem List Items Addressed This Visit   None  No orders of the defined types were placed in this encounter.  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    Asencion Noble, MD

## 2022-01-27 ENCOUNTER — Other Ambulatory Visit: Payer: Self-pay

## 2022-01-27 ENCOUNTER — Encounter: Payer: Self-pay | Admitting: Critical Care Medicine

## 2022-01-27 ENCOUNTER — Ambulatory Visit: Payer: 59 | Attending: Critical Care Medicine | Admitting: Critical Care Medicine

## 2022-01-27 VITALS — BP 117/74 | HR 80 | Ht 73.5 in | Wt 221.6 lb

## 2022-01-27 DIAGNOSIS — I10 Essential (primary) hypertension: Secondary | ICD-10-CM | POA: Diagnosis not present

## 2022-01-27 DIAGNOSIS — K7460 Unspecified cirrhosis of liver: Secondary | ICD-10-CM | POA: Diagnosis not present

## 2022-01-27 DIAGNOSIS — Z13220 Encounter for screening for lipoid disorders: Secondary | ICD-10-CM

## 2022-01-27 DIAGNOSIS — D696 Thrombocytopenia, unspecified: Secondary | ICD-10-CM | POA: Diagnosis not present

## 2022-01-27 DIAGNOSIS — D229 Melanocytic nevi, unspecified: Secondary | ICD-10-CM

## 2022-01-27 DIAGNOSIS — Z1211 Encounter for screening for malignant neoplasm of colon: Secondary | ICD-10-CM

## 2022-01-27 DIAGNOSIS — R188 Other ascites: Secondary | ICD-10-CM

## 2022-01-27 MED ORDER — NEBIVOLOL HCL 10 MG PO TABS
10.0000 mg | ORAL_TABLET | Freq: Every day | ORAL | 3 refills | Status: DC
  Filled 2022-01-27: qty 30, 30d supply, fill #0
  Filled 2022-03-05: qty 30, 30d supply, fill #1
  Filled 2022-04-10: qty 30, 30d supply, fill #2

## 2022-01-27 MED ORDER — CLOBETASOL PROPIONATE 0.05 % EX CREA
1.0000 | TOPICAL_CREAM | Freq: Two times a day (BID) | CUTANEOUS | 0 refills | Status: DC
  Filled 2022-01-27: qty 30, 30d supply, fill #0

## 2022-01-27 MED ORDER — SPIRONOLACTONE 50 MG PO TABS
50.0000 mg | ORAL_TABLET | Freq: Every day | ORAL | 2 refills | Status: DC
  Filled 2022-01-27: qty 90, 90d supply, fill #0
  Filled 2022-03-05: qty 30, 30d supply, fill #0
  Filled 2022-04-10: qty 30, 30d supply, fill #1

## 2022-01-27 NOTE — Assessment & Plan Note (Signed)
Affecting all areas of the skin on the back with irritation we will give topical Temovate

## 2022-01-27 NOTE — Assessment & Plan Note (Addendum)
Blood pressure is at goal we will change Bystolic to a 10 mg tablet daily continue Aldactone 50 mg daily and obtain complete set of metabolic panel and blood counts  We went over with the patient a healthy lifestyle management diet  The following Lifestyle Medicine recommendations according to Daniels of Lifestyle Medicine [facility]) were discussed and offered to patient who agrees to start the journey:  A. Whole Foods, Plant-based plate comprising of fruits and vegetables, plant-based proteins, whole-grain carbohydrates was discussed in detail with the patient.   A list for source of those nutrients were also provided to the patient.  Patient will use only water or unsweetened tea for hydration. B.  The need to stay away from risky substances including alcohol, smoking; obtaining 7 to 9 hours of restorative sleep, at least 150 minutes of moderate intensity exercise weekly, the importance of healthy social connections,  and stress reduction techniques were discussed.

## 2022-01-27 NOTE — Assessment & Plan Note (Signed)
Reassess CBC 

## 2022-01-27 NOTE — Assessment & Plan Note (Signed)
Plan to reassess liver function and also include pro time n INR

## 2022-01-27 NOTE — Patient Instructions (Addendum)
Please pick up application for orange card so that we can get you to dentist and orthopedics  Handicaps sticker application filled out  Bystolic dose change 10 mg daily for refills  Stay on spironolactone daily  Follow healthy diet as outlined on the lifestyle medicine handout we gave  Complete screening labs obtained today also please pick up colon cancer screening kit and return this  Return to see Dr. Joya Gaskins 3 months

## 2022-01-28 ENCOUNTER — Other Ambulatory Visit: Payer: Self-pay | Admitting: Critical Care Medicine

## 2022-01-28 DIAGNOSIS — K754 Autoimmune hepatitis: Secondary | ICD-10-CM

## 2022-01-28 DIAGNOSIS — K7460 Unspecified cirrhosis of liver: Secondary | ICD-10-CM

## 2022-01-28 LAB — COMPREHENSIVE METABOLIC PANEL
ALT: 34 IU/L (ref 0–44)
AST: 68 IU/L — ABNORMAL HIGH (ref 0–40)
Albumin/Globulin Ratio: 0.9 — ABNORMAL LOW (ref 1.2–2.2)
Albumin: 3.5 g/dL — ABNORMAL LOW (ref 3.9–4.9)
Alkaline Phosphatase: 90 IU/L (ref 44–121)
BUN/Creatinine Ratio: 12 (ref 10–24)
BUN: 16 mg/dL (ref 8–27)
Bilirubin Total: 0.8 mg/dL (ref 0.0–1.2)
CO2: 17 mmol/L — ABNORMAL LOW (ref 20–29)
Calcium: 9.1 mg/dL (ref 8.6–10.2)
Chloride: 110 mmol/L — ABNORMAL HIGH (ref 96–106)
Creatinine, Ser: 1.34 mg/dL — ABNORMAL HIGH (ref 0.76–1.27)
Globulin, Total: 4 g/dL (ref 1.5–4.5)
Glucose: 88 mg/dL (ref 70–99)
Potassium: 5.6 mmol/L — ABNORMAL HIGH (ref 3.5–5.2)
Sodium: 142 mmol/L (ref 134–144)
Total Protein: 7.5 g/dL (ref 6.0–8.5)
eGFR: 60 mL/min/{1.73_m2} (ref >59)

## 2022-01-28 LAB — CBC WITH DIFFERENTIAL/PLATELET
Basophils Absolute: 0 10*3/uL (ref 0.0–0.2)
Basos: 1 %
EOS (ABSOLUTE): 0.4 10*3/uL (ref 0.0–0.4)
Eos: 11 %
Hematocrit: 37.4 % — ABNORMAL LOW (ref 37.5–51.0)
Hemoglobin: 12.4 g/dL — ABNORMAL LOW (ref 13.0–17.7)
Immature Grans (Abs): 0 10*3/uL (ref 0.0–0.1)
Immature Granulocytes: 0 %
Lymphocytes Absolute: 1 10*3/uL (ref 0.7–3.1)
Lymphs: 30 %
MCH: 29.8 pg (ref 26.6–33.0)
MCHC: 33.2 g/dL (ref 31.5–35.7)
MCV: 90 fL (ref 79–97)
Monocytes Absolute: 0.5 10*3/uL (ref 0.1–0.9)
Monocytes: 14 %
Neutrophils Absolute: 1.4 10*3/uL (ref 1.4–7.0)
Neutrophils: 44 %
Platelets: 56 10*3/uL — CL (ref 150–450)
RBC: 4.16 x10E6/uL (ref 4.14–5.80)
RDW: 14.4 % (ref 11.6–15.4)
WBC: 3.3 10*3/uL — ABNORMAL LOW (ref 3.4–10.8)

## 2022-01-28 LAB — LIPID PANEL
Chol/HDL Ratio: 2.6 ratio (ref 0.0–5.0)
Cholesterol, Total: 159 mg/dL (ref 100–199)
HDL: 62 mg/dL (ref >39)
LDL Chol Calc (NIH): 86 mg/dL (ref 0–99)
Triglycerides: 50 mg/dL (ref 0–149)
VLDL Cholesterol Cal: 11 mg/dL (ref 5–40)

## 2022-01-28 LAB — PROTIME-INR
INR: 1.2 (ref 0.9–1.2)
Prothrombin Time: 12.3 s — ABNORMAL HIGH (ref 9.1–12.0)

## 2022-01-28 NOTE — Progress Notes (Signed)
Pt aware of results.  Referral to GI and low potassium diet

## 2022-01-30 ENCOUNTER — Other Ambulatory Visit: Payer: Self-pay

## 2022-01-30 LAB — FECAL OCCULT BLOOD, IMMUNOCHEMICAL: Fecal Occult Bld: NEGATIVE

## 2022-02-01 NOTE — Progress Notes (Signed)
Let pt know colon cancer test negative

## 2022-02-02 ENCOUNTER — Telehealth: Payer: Self-pay

## 2022-02-02 NOTE — Telephone Encounter (Signed)
Pt was called and is aware of results, DOB was confirmed.  ?

## 2022-02-02 NOTE — Telephone Encounter (Signed)
-----   Message from Elsie Stain, MD sent at 02/01/2022  7:59 AM EDT ----- Let pt know colon cancer test negative

## 2022-03-05 ENCOUNTER — Other Ambulatory Visit: Payer: Self-pay

## 2022-04-10 ENCOUNTER — Other Ambulatory Visit: Payer: Self-pay

## 2022-04-27 ENCOUNTER — Ambulatory Visit (HOSPITAL_COMMUNITY): Payer: Self-pay

## 2022-04-27 ENCOUNTER — Other Ambulatory Visit: Payer: Self-pay

## 2022-04-27 ENCOUNTER — Ambulatory Visit (HOSPITAL_COMMUNITY): Payer: Worker's Compensation

## 2022-04-27 ENCOUNTER — Ambulatory Visit (HOSPITAL_COMMUNITY)
Admission: EM | Admit: 2022-04-27 | Discharge: 2022-04-27 | Disposition: A | Discharge status: Home / Self Care | Payer: Worker's Compensation | Attending: Nurse Practitioner | Admitting: Nurse Practitioner

## 2022-04-27 ENCOUNTER — Encounter (HOSPITAL_COMMUNITY): Payer: Self-pay | Admitting: *Deleted

## 2022-04-27 ENCOUNTER — Ambulatory Visit (INDEPENDENT_AMBULATORY_CARE_PROVIDER_SITE_OTHER): Payer: Worker's Compensation

## 2022-04-27 DIAGNOSIS — M25521 Pain in right elbow: Secondary | ICD-10-CM

## 2022-04-27 DIAGNOSIS — S0990XA Unspecified injury of head, initial encounter: Secondary | ICD-10-CM | POA: Diagnosis not present

## 2022-04-27 DIAGNOSIS — S01511A Laceration without foreign body of lip, initial encounter: Secondary | ICD-10-CM | POA: Diagnosis not present

## 2022-04-27 DIAGNOSIS — W19XXXA Unspecified fall, initial encounter: Secondary | ICD-10-CM

## 2022-04-27 DIAGNOSIS — S46911A Strain of unspecified muscle, fascia and tendon at shoulder and upper arm level, right arm, initial encounter: Secondary | ICD-10-CM | POA: Diagnosis not present

## 2022-04-27 NOTE — ED Triage Notes (Signed)
T reports he fell at work. Pt reports he fell forward onto face . Pt has swelling to upper lip where his tooth cut his lip. Pt also reports he can not straighten his RT arm. Pt pointing to anterior fore arm as site of pain from fall.

## 2022-04-27 NOTE — Discharge Instructions (Addendum)
  Make sure a responsible adult should stay with you for 24 hours after your injury and check on you often. Physical rest  Brain rest (avoid excess screen time with cell phone, tablets, computers, television) Over-the-counter pain medicines as needed    Go to the ED immediately if:  You have: A very bad headache that is not helped by medicine. Trouble walking or weakness in your arms and legs. Clear or bloody fluid coming from your nose or ears. Changes in how you see (vision). A seizure. More confusion or more grumpy moods. Your symptoms get worse. You are sleepier than normal and have trouble staying awake. You lose your balance. The black centers of your eyes (pupils) change in size. Your speech is slurred. Your dizziness gets worse. You vomit.

## 2022-04-27 NOTE — ED Provider Notes (Signed)
[facility]    CSN: [id number] Arrival date & time: 04/27/22  1620      History   Chief Complaint Chief Complaint  Patient presents with   Fall    HPI Timothy [name] is a 61 y.o. male.   Timothy [name] is a 61 y.o. male that is here for evaluation after a fall. Patient was reportedly rolling a table across the floor, lost his balance and fell. He fell face forward into the carpeted floor. Patient reports hitting his head but denies any loss of consciousness. He reports brief dizziness that has since resolved. He does have a headache. He also reports upper lip swelling, upper lip bleeding and right elbow pain. The lip bleeding occurred immediately after the fall but has stopped prior to arrival. Patient denies neck pain, low back pain, nausea, vomiting, numbness, tingling, incontinence, or inability to ambulate. The patient has tried nothing PTA for his symptoms.           Past Medical History:  Diagnosis Date   Autoimmune hepatitis ([place])    Cirrhosis of liver ([place])    Hypertension    Thrombocytopenia ([place])     Patient Active Problem List   Diagnosis Date Noted   Multiple benign nevi 01/27/2022   Thrombocytopenia ([place]) 10/24/2015   Liver cirrhosis ([place]) 10/24/2015   Hepatitis C 10/24/2015   Autoimmune hepatitis ([place]) 03/28/2013   Essential hypertension 11/15/2012    History reviewed. No pertinent surgical history.     Home Medications    Prior to Admission medications   Medication Sig Start Date End Date Taking? Authorizing Provider  clobetasol cream (TEMOVATE) 2.59 % Apply 1 Application topically 2 (two) times daily. 01/27/22   Elsie Stain, MD  nebivolol (BYSTOLIC) 10 MG tablet Take 1 tablet (10 mg total) by mouth daily. 01/27/22   Elsie Stain, MD  spironolactone (ALDACTONE) 50 MG tablet Take 1 tablet (50 mg total) by mouth daily. 01/27/22   Elsie Stain, MD    Family History Family History  Problem Relation Age of Onset   Hypertension  Sister     Social History Social History   Tobacco Use   Smoking status: Never   Smokeless tobacco: Never  Vaping Use   Vaping Use: Never used  Substance Use Topics   Alcohol use: No    Alcohol/week: 0.0 standard drinks of alcohol   Drug use: No     Allergies   Nsaids   Review of Systems Review of Systems  HENT:  Negative for dental problem.   Eyes:  Negative for visual disturbance.  Gastrointestinal:  Negative for nausea and vomiting.  Musculoskeletal:  Negative for back pain, gait problem and neck pain.  Skin:  Negative for wound.  Neurological:  Positive for dizziness and headaches. Negative for tremors, syncope, speech difficulty, weakness and numbness.  All other systems reviewed and are negative.    Physical Exam Triage Vital Signs ED Triage Vitals  Enc Vitals Group     BP 04/27/22 1757 136/84     Pulse Rate 04/27/22 1757 66     Resp 04/27/22 1757 18     Temp 04/27/22 1757 98.8 F (37.1 C)     Temp src --      SpO2 04/27/22 1757 100 %     Weight --      Height --      Head Circumference --      Peak Flow --      Pain Score 04/27/22  1755 8     Pain Loc --      Pain Edu? --      Excl. in [place]? --    No data found.  Updated Vital Signs BP 136/84   Pulse 66   Temp 98.8 F (37.1 C)   Resp 18   SpO2 100%   Visual Acuity Right Eye Distance:   Left Eye Distance:   Bilateral Distance:    Right Eye Near:   Left Eye Near:    Bilateral Near:     Physical Exam Vitals reviewed.  Constitutional:      General: He is not in acute distress.    Appearance: Normal appearance. He is not toxic-appearing.  HENT:     Head: Normocephalic and atraumatic.     Nose: Nose normal.     Mouth/Throat:     Lips: Pink.     Mouth: Mucous membranes are moist. Injury and lacerations present.     Dentition: Normal dentition. No gingival swelling.     Tongue: No lesions.     Pharynx: Oropharynx is clear.     Comments: Upper lip swelling noted. Small laceration noted  within the inner upper lip. No active bleeding. The laceration does not penetrate through the entire lip.  Eyes:     General: Vision grossly intact. No visual field deficit.    Extraocular Movements: Extraocular movements intact.     Conjunctiva/sclera: Conjunctivae normal.  Cardiovascular:     Rate and Rhythm: Normal rate.  Pulmonary:     Effort: Pulmonary effort is normal.     Breath sounds: Normal breath sounds.  Musculoskeletal:        General: Normal range of motion.     Right elbow: No swelling, deformity, effusion or lacerations. Normal range of motion. Tenderness present in medial epicondyle.     Cervical back: Full passive range of motion without pain, normal range of motion and neck supple. No tenderness.  Skin:    General: Skin is warm and dry.  Neurological:     General: No focal deficit present.     Mental Status: He is alert and oriented to person, place, and time.     GCS: GCS eye subscore is 4. GCS verbal subscore is 5. GCS motor subscore is 6.     Cranial Nerves: Cranial nerves 2-12 are intact. No cranial nerve deficit, dysarthria or facial asymmetry.     Sensory: Sensation is intact. No sensory deficit.     Motor: Motor function is intact.     Coordination: Coordination is intact.     Gait: Gait is intact.  Psychiatric:        Mood and Affect: Mood normal.        Behavior: Behavior normal.      UC Treatments / Results  Labs (all labs ordered are listed, but only abnormal results are displayed) Labs Reviewed - No data to display  EKG   Radiology DG Elbow Complete Right  Result Date: 04/27/2022 CLINICAL DATA:  Fall; Head Pain; Lip Pain; Right Arm Pain; (WC)fall EXAM: RIGHT ELBOW - COMPLETE 3+ VIEW COMPARISON:  None Available. FINDINGS: No evidence of fracture of the ulna or humerus. The radial head is normal. No joint effusion. IMPRESSION: No fracture or dislocation. Electronically Signed   By: Suzy Bouchard M.D.   On: 04/27/2022 18:58     Procedures Procedures (including critical care time)  Medications Ordered in UC Medications - No data to display  Initial Impression / Assessment and  Plan / UC Course  I have reviewed the triage vital signs and the nursing notes.  Pertinent labs & imaging results that were available during my care of the patient were reviewed by me and considered in my medical decision making (see chart for details).    61 year old male presenting with headache, dizziness, upper lip swelling, upper lip laceration and right elbow pain status post fall earlier this afternoon.  Patient's dizziness and bleeding from the upper lip resolved prior to arrival to urgent care.  The laceration is in the inner aspect of the lip and does not go through and through.  Primary closure not indicated.  Patient alert and oriented.  Afebrile.  Nontoxic.  Vital signs stable.  No focal neurodeficits noted.  X-ray of the right elbow negative for any acute fractures or abnormalities.  CT not indicated. Negative CATCH Rule.  Supportive care advised.  Patient should have a responsible adult stay with him for the next 24 hours.  Physical and cognitive rest.  Over-the-counter medications as needed for pain.  Today's evaluation has revealed no signs of a dangerous process. Discussed diagnosis with patient and/or guardian. Patient and/or guardian aware of their diagnosis, possible red flag symptoms to watch out for and need for close follow up. Patient and/or guardian understands verbal and written discharge instructions. Patient and/or guardian comfortable with plan and disposition.  Patient and/or guardian has a clear mental status at this time, good insight into illness (after discussion and teaching) and has clear judgment to make decisions regarding their care  Documentation was completed with the aid of voice recognition software. Transcription may contain typographical errors. Final Clinical Impressions(s) / UC Diagnoses   Final  diagnoses:  Minor head injury, initial encounter  Fall, initial encounter  Laceration of lip without complication, initial encounter  Elbow strain, right, initial encounter     Discharge Instructions       Make sure a responsible adult should stay with you for 24 hours after your injury and check on you often. Physical rest  Brain rest (avoid excess screen time with cell phone, tablets, computers, television) Over-the-counter pain medicines as needed    Go to the ED immediately if:  You have: A very bad headache that is not helped by medicine. Trouble walking or weakness in your arms and legs. Clear or bloody fluid coming from your nose or ears. Changes in how you see (vision). A seizure. More confusion or more grumpy moods. Your symptoms get worse. You are sleepier than normal and have trouble staying awake. You lose your balance. The black centers of your eyes (pupils) change in size. Your speech is slurred. Your dizziness gets worse. You vomit.     ED Prescriptions   None    PDMP not reviewed this encounter.   Enrique Sack, [place] 04/27/22 1933

## 2022-04-29 ENCOUNTER — Ambulatory Visit: Payer: 59 | Admitting: Critical Care Medicine

## 2022-04-29 ENCOUNTER — Ambulatory Visit: Payer: Self-pay | Attending: Critical Care Medicine | Admitting: Physician Assistant

## 2022-04-29 ENCOUNTER — Encounter: Payer: Self-pay | Admitting: Physician Assistant

## 2022-04-29 ENCOUNTER — Other Ambulatory Visit: Payer: Self-pay

## 2022-04-29 VITALS — BP 132/86 | HR 61 | Ht 73.5 in | Wt 210.2 lb

## 2022-04-29 DIAGNOSIS — Z09 Encounter for follow-up examination after completed treatment for conditions other than malignant neoplasm: Secondary | ICD-10-CM

## 2022-04-29 DIAGNOSIS — I10 Essential (primary) hypertension: Secondary | ICD-10-CM

## 2022-04-29 DIAGNOSIS — K7460 Unspecified cirrhosis of liver: Secondary | ICD-10-CM

## 2022-04-29 DIAGNOSIS — D696 Thrombocytopenia, unspecified: Secondary | ICD-10-CM

## 2022-04-29 DIAGNOSIS — S0003XS Contusion of scalp, sequela: Secondary | ICD-10-CM

## 2022-04-29 MED ORDER — NEBIVOLOL HCL 10 MG PO TABS
10.0000 mg | ORAL_TABLET | Freq: Every day | ORAL | 1 refills | Status: DC
  Filled 2022-04-29 – 2022-05-18 (×2): qty 90, 90d supply, fill #0
  Filled 2022-09-09: qty 90, 90d supply, fill #1

## 2022-04-29 MED ORDER — SPIRONOLACTONE 50 MG PO TABS
50.0000 mg | ORAL_TABLET | Freq: Every day | ORAL | 1 refills | Status: DC
  Filled 2022-04-29 – 2022-05-18 (×2): qty 90, 90d supply, fill #0
  Filled 2022-09-09: qty 90, 90d supply, fill #1

## 2022-04-29 NOTE — Progress Notes (Signed)
Patient ID: Timothy [name], male   DOB: 23-Nov-1960, 61 y.o.   MRN: [id number]     Timothy [name], is a 61 y.o. male  FHQ:[id number]  QDI:[id number]  DOB - 1961/01/03  Chief Complaint  Patient presents with   Hypertension       Subjective:   Timothy [name] is a 61 y.o. male here today for a follow up visit   After ED visit 29/48:  61 year old male presenting with headache, dizziness, upper lip swelling, upper lip laceration and right elbow pain status post fall earlier this afternoon.  Patient's dizziness and bleeding from the upper lip resolved prior to arrival to urgent care.  The laceration is in the inner aspect of the lip and does not go through and through.  Primary closure not indicated.  Patient alert and oriented.  Afebrile.  Nontoxic.  Vital signs stable.  No focal neurodeficits noted.  X-ray of the right elbow negative for any acute fractures or abnormalities.  CT not indicated. Negative CATCH Rule.  Supportive care advised.  Patient should have a responsible adult stay with him for the next 24 hours.  Physical and cognitive rest.  Over-the-counter medications as needed for pain.   Today's evaluation has revealed no signs of a dangerous process.   Also here for med RF BP recheck.  No LOC with injury.  Elbow lip and head improving.  No vision changes since injuries   No problems updated.  ALLERGIES: Allergies  Allergen Reactions   Nsaids     Patient denies any allergy to NSAIDs (particular Advil) He takes this medication frequently without any adverse side effects     PAST MEDICAL HISTORY: Past Medical History:  Diagnosis Date   Autoimmune hepatitis ([place])    Cirrhosis of liver ([place])    Hypertension    Thrombocytopenia ([place])     MEDICATIONS AT HOME: Prior to Admission medications   Medication Sig Start Date End Date Taking? Authorizing Provider  clobetasol cream (TEMOVATE) 4.07 % Apply 1 Application topically 2 (two) times daily. Patient not taking: Reported on 04/29/2022  01/27/22   Elsie Stain, MD  nebivolol (BYSTOLIC) 10 MG tablet Take 1 tablet (10 mg total) by mouth daily. 04/29/22   Argentina Donovan, PA-C  spironolactone (ALDACTONE) 50 MG tablet Take 1 tablet (50 mg total) by mouth daily. 04/29/22   Djibril Glogowski, Dionne Bucy, PA-C    ROS: Neg HEENT Neg resp Neg cardiac Neg GI Neg GU Neg MS Neg psych Neg neuro  Objective:   Vitals:   04/29/22 0933 04/29/22 0957  BP: (!) 160/93 132/86  Pulse: 61   SpO2: 99%   Weight: 210 lb 3.2 oz (95.3 kg)   Height: 6' 1.5" (1.867 m)    Exam General appearance : Awake, alert, not in any distress. Speech Clear. Not toxic looking HEENT: Atraumatic and Normocephalic, pupils equally reactive to light and accomodation Neck: Supple, no JVD. No cervical lymphadenopathy.  Chest: Good air entry bilaterally, CTAB.  No rales/rhonchi/wheezing CVS: S1 S2 regular, no murmurs.  Extremities: B/L Lower Ext shows no edema, both legs are warm to touch Neurology: Awake alert, and oriented X 3, CN II-XII intact, Non focal Skin: No Rash  Data Review Lab Results  Component Value Date   HGBA1C 5.9 (H) 06/11/2020   HGBA1C 6.0 (H) 10/31/2019   HGBA1C 5.8 (H) 07/17/2019    Assessment & Plan   1. Thrombocytopenia (HCC) - Comprehensive metabolic panel - CBC with Differential/Platelet  2. Essential hypertension - Comprehensive metabolic panel -  nebivolol (BYSTOLIC) 10 MG tablet; Take 1 tablet (10 mg total) by mouth daily.  Dispense: 90 tablet; Refill: 1  3. Cirrhosis of liver without ascites, unspecified hepatic cirrhosis type (HCC) - Comprehensive metabolic panel - spironolactone (ALDACTONE) 50 MG tablet; Take 1 tablet (50 mg total) by mouth daily.  Dispense: 90 tablet; Refill: 1  4. Hospital discharge follow-up  5. Contusion of scalp, sequela improving    Return in about 6 months (around 10/29/2022) for PCP.  The patient was given clear instructions to go to ER or return to medical center if symptoms don't  improve, worsen or new problems develop. The patient verbalized understanding. The patient was told to call to get lab results if they haven't heard anything in the next week.      Freeman Caldron, PA-C [facility], [place]   04/29/2022, 9:59 AM

## 2022-04-30 LAB — CBC WITH DIFFERENTIAL/PLATELET
Basophils Absolute: 0 10*3/uL (ref 0.0–0.2)
Basos: 1 %
EOS (ABSOLUTE): 0.3 10*3/uL (ref 0.0–0.4)
Eos: 9 %
Hematocrit: 36.2 % — ABNORMAL LOW (ref 37.5–51.0)
Hemoglobin: 12.2 g/dL — ABNORMAL LOW (ref 13.0–17.7)
Immature Grans (Abs): 0 10*3/uL (ref 0.0–0.1)
Immature Granulocytes: 0 %
Lymphocytes Absolute: 0.8 10*3/uL (ref 0.7–3.1)
Lymphs: 23 %
MCH: 29.8 pg (ref 26.6–33.0)
MCHC: 33.7 g/dL (ref 31.5–35.7)
MCV: 89 fL (ref 79–97)
Monocytes Absolute: 0.5 10*3/uL (ref 0.1–0.9)
Monocytes: 16 %
Neutrophils Absolute: 1.6 10*3/uL (ref 1.4–7.0)
Neutrophils: 51 %
Platelets: 46 10*3/uL — CL (ref 150–450)
RBC: 4.09 x10E6/uL — ABNORMAL LOW (ref 4.14–5.80)
RDW: 13.7 % (ref 11.6–15.4)
WBC: 3.2 10*3/uL — ABNORMAL LOW (ref 3.4–10.8)

## 2022-04-30 LAB — COMPREHENSIVE METABOLIC PANEL
ALT: 35 IU/L (ref 0–44)
AST: 67 IU/L — ABNORMAL HIGH (ref 0–40)
Albumin/Globulin Ratio: 0.8 — ABNORMAL LOW (ref 1.2–2.2)
Albumin: 3.3 g/dL — ABNORMAL LOW (ref 3.9–4.9)
Alkaline Phosphatase: 86 IU/L (ref 44–121)
BUN/Creatinine Ratio: 14 (ref 10–24)
BUN: 17 mg/dL (ref 8–27)
Bilirubin Total: 0.7 mg/dL (ref 0.0–1.2)
CO2: 20 mmol/L (ref 20–29)
Calcium: 9 mg/dL (ref 8.6–10.2)
Chloride: 110 mmol/L — ABNORMAL HIGH (ref 96–106)
Creatinine, Ser: 1.22 mg/dL (ref 0.76–1.27)
Globulin, Total: 3.9 g/dL (ref 1.5–4.5)
Glucose: 78 mg/dL (ref 70–99)
Potassium: 4.6 mmol/L (ref 3.5–5.2)
Sodium: 141 mmol/L (ref 134–144)
Total Protein: 7.2 g/dL (ref 6.0–8.5)
eGFR: 67 mL/min/{1.73_m2} (ref >59)

## 2022-05-07 ENCOUNTER — Encounter: Payer: Self-pay | Admitting: *Deleted

## 2022-05-18 ENCOUNTER — Other Ambulatory Visit: Payer: Self-pay

## 2022-07-27 ENCOUNTER — Other Ambulatory Visit: Payer: Self-pay

## 2022-07-27 MED ORDER — COVID-19 MRNA VAC-TRIS(PFIZER) 30 MCG/0.3ML IM SUSY
0.3000 mL | PREFILLED_SYRINGE | Freq: Once | INTRAMUSCULAR | 0 refills | Status: AC
  Filled 2022-07-27: qty 0.3, 1d supply, fill #0

## 2022-09-09 ENCOUNTER — Other Ambulatory Visit: Payer: Self-pay

## 2022-09-11 ENCOUNTER — Other Ambulatory Visit: Payer: Self-pay

## 2022-10-20 ENCOUNTER — Encounter (HOSPITAL_COMMUNITY): Payer: Self-pay | Admitting: Anesthesiology

## 2022-10-20 ENCOUNTER — Ambulatory Visit (HOSPITAL_COMMUNITY)
Admission: EM | Admit: 2022-10-20 | Discharge: 2022-10-20 | Disposition: A | Discharge status: Home / Self Care | Payer: Medicaid Other | Attending: Family Medicine | Admitting: Family Medicine

## 2022-10-20 ENCOUNTER — Inpatient Hospital Stay (HOSPITAL_COMMUNITY): Payer: Medicaid Other

## 2022-10-20 ENCOUNTER — Encounter (HOSPITAL_COMMUNITY): Payer: Self-pay

## 2022-10-20 ENCOUNTER — Other Ambulatory Visit: Payer: Self-pay

## 2022-10-20 ENCOUNTER — Encounter (HOSPITAL_COMMUNITY)
Admission: EM | Disposition: A | Discharge status: Home / Self Care | Payer: Self-pay | Source: Home / Self Care | Attending: Internal Medicine

## 2022-10-20 ENCOUNTER — Emergency Department (HOSPITAL_COMMUNITY): Payer: Medicaid Other

## 2022-10-20 ENCOUNTER — Encounter (HOSPITAL_COMMUNITY): Payer: Self-pay | Admitting: Pulmonary Disease

## 2022-10-20 ENCOUNTER — Inpatient Hospital Stay (HOSPITAL_COMMUNITY)
Admission: EM | Admit: 2022-10-20 | Discharge: 2022-10-24 | DRG: 432 | Disposition: A | Discharge status: Home / Self Care | Payer: Medicaid Other | Attending: Internal Medicine | Admitting: Internal Medicine

## 2022-10-20 DIAGNOSIS — K317 Polyp of stomach and duodenum: Secondary | ICD-10-CM | POA: Diagnosis present

## 2022-10-20 DIAGNOSIS — I440 Atrioventricular block, first degree: Secondary | ICD-10-CM | POA: Diagnosis present

## 2022-10-20 DIAGNOSIS — I8500 Esophageal varices without bleeding: Secondary | ICD-10-CM | POA: Diagnosis not present

## 2022-10-20 DIAGNOSIS — K625 Hemorrhage of anus and rectum: Secondary | ICD-10-CM

## 2022-10-20 DIAGNOSIS — I959 Hypotension, unspecified: Secondary | ICD-10-CM | POA: Diagnosis present

## 2022-10-20 DIAGNOSIS — I10 Essential (primary) hypertension: Secondary | ICD-10-CM | POA: Diagnosis present

## 2022-10-20 DIAGNOSIS — R188 Other ascites: Secondary | ICD-10-CM | POA: Diagnosis present

## 2022-10-20 DIAGNOSIS — D62 Acute posthemorrhagic anemia: Secondary | ICD-10-CM | POA: Diagnosis present

## 2022-10-20 DIAGNOSIS — K754 Autoimmune hepatitis: Secondary | ICD-10-CM | POA: Diagnosis present

## 2022-10-20 DIAGNOSIS — E876 Hypokalemia: Secondary | ICD-10-CM | POA: Diagnosis not present

## 2022-10-20 DIAGNOSIS — Z87442 Personal history of urinary calculi: Secondary | ICD-10-CM | POA: Diagnosis not present

## 2022-10-20 DIAGNOSIS — K7460 Unspecified cirrhosis of liver: Principal | ICD-10-CM | POA: Diagnosis present

## 2022-10-20 DIAGNOSIS — D696 Thrombocytopenia, unspecified: Secondary | ICD-10-CM | POA: Diagnosis present

## 2022-10-20 DIAGNOSIS — Z8249 Family history of ischemic heart disease and other diseases of the circulatory system: Secondary | ICD-10-CM

## 2022-10-20 DIAGNOSIS — R161 Splenomegaly, not elsewhere classified: Secondary | ICD-10-CM | POA: Diagnosis present

## 2022-10-20 DIAGNOSIS — I8511 Secondary esophageal varices with bleeding: Secondary | ICD-10-CM | POA: Insufficient documentation

## 2022-10-20 DIAGNOSIS — R55 Syncope and collapse: Secondary | ICD-10-CM | POA: Diagnosis not present

## 2022-10-20 DIAGNOSIS — N179 Acute kidney failure, unspecified: Secondary | ICD-10-CM | POA: Diagnosis present

## 2022-10-20 DIAGNOSIS — Z886 Allergy status to analgesic agent status: Secondary | ICD-10-CM | POA: Diagnosis not present

## 2022-10-20 DIAGNOSIS — K922 Gastrointestinal hemorrhage, unspecified: Secondary | ICD-10-CM | POA: Diagnosis not present

## 2022-10-20 DIAGNOSIS — K254 Chronic or unspecified gastric ulcer with hemorrhage: Secondary | ICD-10-CM | POA: Diagnosis not present

## 2022-10-20 DIAGNOSIS — K219 Gastro-esophageal reflux disease without esophagitis: Secondary | ICD-10-CM | POA: Diagnosis present

## 2022-10-20 DIAGNOSIS — K920 Hematemesis: Secondary | ICD-10-CM | POA: Diagnosis not present

## 2022-10-20 DIAGNOSIS — R404 Transient alteration of awareness: Secondary | ICD-10-CM

## 2022-10-20 DIAGNOSIS — E875 Hyperkalemia: Secondary | ICD-10-CM | POA: Diagnosis present

## 2022-10-20 DIAGNOSIS — K766 Portal hypertension: Secondary | ICD-10-CM | POA: Diagnosis not present

## 2022-10-20 DIAGNOSIS — I8501 Esophageal varices with bleeding: Secondary | ICD-10-CM | POA: Diagnosis not present

## 2022-10-20 DIAGNOSIS — Z79899 Other long term (current) drug therapy: Secondary | ICD-10-CM

## 2022-10-20 DIAGNOSIS — E8809 Other disorders of plasma-protein metabolism, not elsewhere classified: Secondary | ICD-10-CM | POA: Diagnosis present

## 2022-10-20 DIAGNOSIS — Z452 Encounter for adjustment and management of vascular access device: Secondary | ICD-10-CM | POA: Diagnosis not present

## 2022-10-20 DIAGNOSIS — K8020 Calculus of gallbladder without cholecystitis without obstruction: Secondary | ICD-10-CM | POA: Diagnosis not present

## 2022-10-20 HISTORY — PX: ESOPHAGEAL BANDING: SHX5518

## 2022-10-20 HISTORY — PX: ESOPHAGOGASTRODUODENOSCOPY: SHX5428

## 2022-10-20 LAB — TYPE AND SCREEN

## 2022-10-20 LAB — CBC WITH DIFFERENTIAL/PLATELET
Abs Immature Granulocytes: 0.06 10*3/uL (ref 0.00–0.07)
Abs Immature Granulocytes: 0.09 10*3/uL — ABNORMAL HIGH (ref 0.00–0.07)
Basophils Absolute: 0 10*3/uL (ref 0.0–0.1)
Basophils Absolute: 0 10*3/uL (ref 0.0–0.1)
Basophils Relative: 0 %
Basophils Relative: 0 %
Eosinophils Absolute: 0.1 10*3/uL (ref 0.0–0.5)
Eosinophils Absolute: 0 10*3/uL (ref 0.0–0.5)
Eosinophils Relative: 2 %
Eosinophils Relative: 0 %
HCT: 15 % — ABNORMAL LOW (ref 39.0–52.0)
HCT: 23.9 % — ABNORMAL LOW (ref 39.0–52.0)
Hemoglobin: 4.9 g/dL — CL (ref 13.0–17.0)
Hemoglobin: 7.9 g/dL — ABNORMAL LOW (ref 13.0–17.0)
Immature Granulocytes: 1 %
Immature Granulocytes: 1 %
Lymphocytes Relative: 31 %
Lymphocytes Relative: 14 %
Lymphs Abs: 1.9 10*3/uL (ref 0.7–4.0)
Lymphs Abs: 1.9 10*3/uL (ref 0.7–4.0)
MCH: 31 pg (ref 26.0–34.0)
MCH: 29.2 pg (ref 26.0–34.0)
MCHC: 32.7 g/dL (ref 30.0–36.0)
MCHC: 33.1 g/dL (ref 30.0–36.0)
MCV: 94.9 fL (ref 80.0–100.0)
MCV: 88.2 fL (ref 80.0–100.0)
Monocytes Absolute: 0.5 10*3/uL (ref 0.1–1.0)
Monocytes Absolute: 1.1 10*3/uL — ABNORMAL HIGH (ref 0.1–1.0)
Monocytes Relative: 9 %
Monocytes Relative: 8 %
Neutro Abs: 3.5 10*3/uL (ref 1.7–7.7)
Neutro Abs: 10.5 10*3/uL — ABNORMAL HIGH (ref 1.7–7.7)
Neutrophils Relative %: 57 %
Neutrophils Relative %: 77 %
Platelets: 46 10*3/uL — ABNORMAL LOW (ref 150–400)
Platelets: 70 10*3/uL — ABNORMAL LOW (ref 150–400)
RBC: 1.58 MIL/uL — ABNORMAL LOW (ref 4.22–5.81)
RBC: 2.71 MIL/uL — ABNORMAL LOW (ref 4.22–5.81)
RDW: 16.3 % — ABNORMAL HIGH (ref 11.5–15.5)
RDW: 16.2 % — ABNORMAL HIGH (ref 11.5–15.5)
WBC: 6.1 10*3/uL (ref 4.0–10.5)
WBC: 13.6 10*3/uL — ABNORMAL HIGH (ref 4.0–10.5)
nRBC: 0 % (ref 0.0–0.2)
nRBC: 0 % (ref 0.0–0.2)

## 2022-10-20 LAB — BASIC METABOLIC PANEL
Anion gap: 4 — ABNORMAL LOW (ref 5–15)
Anion gap: 15 (ref 5–15)
Anion gap: 4 — ABNORMAL LOW (ref 5–15)
BUN: 40 mg/dL — ABNORMAL HIGH (ref 8–23)
BUN: 35 mg/dL — ABNORMAL HIGH (ref 8–23)
BUN: 42 mg/dL — ABNORMAL HIGH (ref 8–23)
CO2: 16 mmol/L — ABNORMAL LOW (ref 22–32)
CO2: 18 mmol/L — ABNORMAL LOW (ref 22–32)
CO2: 16 mmol/L — ABNORMAL LOW (ref 22–32)
Calcium: 7.2 mg/dL — ABNORMAL LOW (ref 8.9–10.3)
Calcium: 6.9 mg/dL — ABNORMAL LOW (ref 8.9–10.3)
Calcium: 7 mg/dL — ABNORMAL LOW (ref 8.9–10.3)
Chloride: 118 mmol/L — ABNORMAL HIGH (ref 98–111)
Chloride: 109 mmol/L (ref 98–111)
Chloride: 116 mmol/L — ABNORMAL HIGH (ref 98–111)
Creatinine, Ser: 1.59 mg/dL — ABNORMAL HIGH (ref 0.61–1.24)
Creatinine, Ser: 1.56 mg/dL — ABNORMAL HIGH (ref 0.61–1.24)
Creatinine, Ser: 1.69 mg/dL — ABNORMAL HIGH (ref 0.61–1.24)
GFR, Estimated: 49 mL/min — ABNORMAL LOW (ref >60)
GFR, Estimated: 50 mL/min — ABNORMAL LOW (ref >60)
GFR, Estimated: 45 mL/min — ABNORMAL LOW (ref >60)
Glucose, Bld: 126 mg/dL — ABNORMAL HIGH (ref 70–99)
Glucose, Bld: 179 mg/dL — ABNORMAL HIGH (ref 70–99)
Glucose, Bld: 143 mg/dL — ABNORMAL HIGH (ref 70–99)
Potassium: 5.5 mmol/L — ABNORMAL HIGH (ref 3.5–5.1)
Potassium: 5.2 mmol/L — ABNORMAL HIGH (ref 3.5–5.1)
Potassium: 5.7 mmol/L — ABNORMAL HIGH (ref 3.5–5.1)
Sodium: 138 mmol/L (ref 135–145)
Sodium: 142 mmol/L (ref 135–145)
Sodium: 136 mmol/L (ref 135–145)

## 2022-10-20 LAB — PROTIME-INR
INR: 2.8 — ABNORMAL HIGH (ref 0.8–1.2)
INR: 1.3 — ABNORMAL HIGH (ref 0.8–1.2)
Prothrombin Time: 29.1 seconds — ABNORMAL HIGH (ref 11.4–15.2)
Prothrombin Time: 16.2 seconds — ABNORMAL HIGH (ref 11.4–15.2)

## 2022-10-20 LAB — COMPREHENSIVE METABOLIC PANEL
Alkaline Phosphatase: 21 U/L — ABNORMAL LOW (ref 38–126)
Anion gap: 5 (ref 5–15)
BUN: 23 mg/dL (ref 8–23)
Glucose, Bld: 73 mg/dL (ref 70–99)
Total Protein: <3 g/dL — ABNORMAL LOW (ref 6.5–8.1)

## 2022-10-20 LAB — PREPARE PLATELET PHERESIS

## 2022-10-20 LAB — MAGNESIUM
Magnesium: 1.7 mg/dL (ref 1.7–2.4)
Magnesium: 1.6 mg/dL — ABNORMAL LOW (ref 1.7–2.4)

## 2022-10-20 LAB — BPAM RBC
Blood Product Expiration Date: 202405112359
Unit Type and Rh: 6200
Unit Type and Rh: 6200

## 2022-10-20 LAB — TROPONIN I (HIGH SENSITIVITY)
Troponin I (High Sensitivity): 4 ng/L (ref <18)
Troponin I (High Sensitivity): 48 ng/L — ABNORMAL HIGH (ref <18)

## 2022-10-20 LAB — MRSA NEXT GEN BY PCR, NASAL: MRSA by PCR Next Gen: NOT DETECTED

## 2022-10-20 LAB — LIPASE, BLOOD: Lipase: 37 U/L (ref 11–51)

## 2022-10-20 LAB — HIV ANTIBODY (ROUTINE TESTING W REFLEX): HIV Screen 4th Generation wRfx: NONREACTIVE

## 2022-10-20 LAB — ABO/RH: ABO/RH(D): A POS

## 2022-10-20 LAB — BPAM FFP

## 2022-10-20 LAB — POC HEMOCCULT BLD/STL (OFFICE/1-CARD/DIAGNOSTIC): Fecal Occult Blood, POC: POSITIVE — AB

## 2022-10-20 LAB — PREPARE RBC (CROSSMATCH)

## 2022-10-20 LAB — ETHANOL: Alcohol, Ethyl (B): <10 mg/dL (ref <10)

## 2022-10-20 LAB — PREPARE FRESH FROZEN PLASMA: Unit division: 0

## 2022-10-20 LAB — GLUCOSE, CAPILLARY
Glucose-Capillary: 96 mg/dL (ref 70–99)
Glucose-Capillary: 131 mg/dL — ABNORMAL HIGH (ref 70–99)

## 2022-10-20 SURGERY — EGD (ESOPHAGOGASTRODUODENOSCOPY)
Anesthesia: Moderate Sedation

## 2022-10-20 MED ORDER — SODIUM CHLORIDE 0.9 % IV SOLN
1.0000 g | INTRAVENOUS | Status: DC
  Administered 2022-10-20: 1 g via INTRAVENOUS
  Filled 2022-10-20: qty 10

## 2022-10-20 MED ORDER — SODIUM CHLORIDE 0.9 % IV SOLN: INTRAVENOUS | Status: DC

## 2022-10-20 MED ORDER — SODIUM CHLORIDE 0.9 % IV BOLUS
1000.0000 mL | Freq: Once | INTRAVENOUS | Status: AC
  Administered 2022-10-20: 1000 mL via INTRAVENOUS

## 2022-10-20 MED ORDER — OCTREOTIDE LOAD VIA INFUSION
50.0000 ug | Freq: Once | INTRAVENOUS | Status: AC
  Administered 2022-10-20: 50 ug via INTRAVENOUS
  Filled 2022-10-20: qty 25

## 2022-10-20 MED ORDER — FENTANYL CITRATE (PF) 100 MCG/2ML IJ SOLN
INTRAMUSCULAR | Status: AC
  Filled 2022-10-20: qty 4

## 2022-10-20 MED ORDER — FENTANYL CITRATE PF 50 MCG/ML IJ SOSY
PREFILLED_SYRINGE | INTRAMUSCULAR | Status: AC
  Administered 2022-10-20: 50 ug via INTRAVENOUS
  Filled 2022-10-20: qty 1

## 2022-10-20 MED ORDER — PROPOFOL 1000 MG/100ML IV EMUL: 5.0000 ug/kg/min | INTRAVENOUS | Status: DC

## 2022-10-20 MED ORDER — POTASSIUM CHLORIDE 10 MEQ/100ML IV SOLN
10.0000 meq | INTRAVENOUS | Status: DC
  Administered 2022-10-20 (×3): 10 meq via INTRAVENOUS
  Filled 2022-10-20 (×5): qty 100

## 2022-10-20 MED ORDER — METOCLOPRAMIDE HCL 5 MG/ML IJ SOLN
20.0000 mg | Freq: Once | INTRAVENOUS | Status: AC
  Administered 2022-10-20: 20 mg via INTRAVENOUS
  Filled 2022-10-20: qty 4

## 2022-10-20 MED ORDER — SODIUM CHLORIDE 0.9 % IV SOLN: 1.0000 g | Freq: Once | INTRAVENOUS | Status: DC

## 2022-10-20 MED ORDER — SODIUM CHLORIDE 0.9% IV SOLUTION: Freq: Once | INTRAVENOUS | Status: AC

## 2022-10-20 MED ORDER — SODIUM CHLORIDE 0.9 % IV SOLN
50.0000 ug/h | INTRAVENOUS | Status: AC
  Administered 2022-10-20 – 2022-10-23 (×7): 50 ug/h via INTRAVENOUS
  Filled 2022-10-20 (×8): qty 1

## 2022-10-20 MED ORDER — ROCURONIUM BROMIDE 10 MG/ML (PF) SYRINGE
100.0000 mg | PREFILLED_SYRINGE | Freq: Once | INTRAVENOUS | Status: AC
  Administered 2022-10-20: 80 mg via INTRAVENOUS
  Filled 2022-10-20: qty 10

## 2022-10-20 MED ORDER — ONDANSETRON HCL 4 MG/2ML IJ SOLN
4.0000 mg | Freq: Once | INTRAMUSCULAR | Status: AC
  Administered 2022-10-20: 4 mg via INTRAVENOUS
  Filled 2022-10-20: qty 2

## 2022-10-20 MED ORDER — MAGNESIUM SULFATE 2 GM/50ML IV SOLN
2.0000 g | Freq: Once | INTRAVENOUS | Status: AC
  Administered 2022-10-20: 2 g via INTRAVENOUS
  Filled 2022-10-20: qty 50

## 2022-10-20 MED ORDER — POLYETHYLENE GLYCOL 3350 17 G PO PACK: 17.0000 g | PACK | Freq: Every day | ORAL | Status: DC | PRN

## 2022-10-20 MED ORDER — EPINEPHRINE 1 MG/10ML IJ SOSY
PREFILLED_SYRINGE | INTRAMUSCULAR | Status: AC
  Filled 2022-10-20: qty 10

## 2022-10-20 MED ORDER — VITAMIN K1 10 MG/ML IJ SOLN
5.0000 mg | Freq: Once | INTRAVENOUS | Status: AC
  Administered 2022-10-20: 5 mg via INTRAVENOUS
  Filled 2022-10-20: qty 0.5

## 2022-10-20 MED ORDER — MIDAZOLAM HCL (PF) 5 MG/ML IJ SOLN
INTRAMUSCULAR | Status: AC
  Filled 2022-10-20: qty 2

## 2022-10-20 MED ORDER — PROPOFOL 10 MG/ML IV BOLUS: 100.0000 mg | Freq: Once | INTRAVENOUS | Status: DC

## 2022-10-20 MED ORDER — ETOMIDATE 2 MG/ML IV SOLN
10.0000 mg | Freq: Once | INTRAVENOUS | Status: AC
  Administered 2022-10-20: 10 mg via INTRAVENOUS
  Filled 2022-10-20: qty 10

## 2022-10-20 MED ORDER — PANTOPRAZOLE 80MG IVPB - SIMPLE MED
80.0000 mg | Freq: Once | INTRAVENOUS | Status: AC
  Administered 2022-10-20: 80 mg via INTRAVENOUS
  Filled 2022-10-20: qty 100

## 2022-10-20 MED ORDER — ROCURONIUM BROMIDE 10 MG/ML (PF) SYRINGE
PREFILLED_SYRINGE | INTRAVENOUS | Status: AC
  Filled 2022-10-20: qty 10

## 2022-10-20 MED ORDER — SODIUM CHLORIDE 0.9% IV SOLUTION: Freq: Once | INTRAVENOUS | Status: DC

## 2022-10-20 MED ORDER — PHENYLEPHRINE 80 MCG/ML (10ML) SYRINGE FOR IV PUSH (FOR BLOOD PRESSURE SUPPORT)
80.0000 ug | PREFILLED_SYRINGE | Freq: Once | INTRAVENOUS | Status: AC | PRN
  Administered 2022-10-20: 200 ug via INTRAVENOUS
  Filled 2022-10-20: qty 10

## 2022-10-20 MED ORDER — FENTANYL CITRATE PF 50 MCG/ML IJ SOSY
100.0000 ug | PREFILLED_SYRINGE | Freq: Once | INTRAMUSCULAR | Status: AC
  Administered 2022-10-20: 100 ug via INTRAVENOUS
  Filled 2022-10-20: qty 2

## 2022-10-20 MED ORDER — LABETALOL HCL 5 MG/ML IV SOLN
10.0000 mg | INTRAVENOUS | Status: DC | PRN
  Administered 2022-10-20: 10 mg via INTRAVENOUS
  Filled 2022-10-20: qty 4

## 2022-10-20 MED ORDER — PROPOFOL 1000 MG/100ML IV EMUL
INTRAVENOUS | Status: AC
  Filled 2022-10-20: qty 100

## 2022-10-20 MED ORDER — DOCUSATE SODIUM 100 MG PO CAPS: 100.0000 mg | ORAL_CAPSULE | Freq: Two times a day (BID) | ORAL | Status: DC | PRN

## 2022-10-20 MED ORDER — FENTANYL CITRATE PF 50 MCG/ML IJ SOSY: 50.0000 ug | PREFILLED_SYRINGE | Freq: Once | INTRAMUSCULAR | Status: AC

## 2022-10-20 NOTE — ED Notes (Signed)
Pt threw up over 1200 mL of blood.  At least 600 of which made it into a suction canister that I gave him to throw up into

## 2022-10-20 NOTE — Progress Notes (Signed)
Spoke with Primary RN regarding need for PICC line. Pt had a TL CL placed this shift, and currently does not need a PICC. Order to be cancelled.

## 2022-10-20 NOTE — Procedures (Signed)
Extubation Procedure Note  Patient Details:   Name: Timothy [name] DOB: 08-21-1960 MRN: [id number]   Airway Documentation:    Vent end date: 10/20/22 Vent end time: 1756   Evaluation  O2 sats: stable throughout Complications: No apparent complications Patient did tolerate procedure well. Bilateral Breath Sounds: Diminished, Clear   Yes  [name] 10/20/2022, 5:56 PM Extubated to 3L [place] at this time.

## 2022-10-20 NOTE — Progress Notes (Signed)
Sent secure chat and paged Dr. Denese Killings with no call back to make MD aware that patient's troponin is critical 48 and Mag 1.7, Doy Mince, RN charge nurse also paged MD with no call back yet.

## 2022-10-20 NOTE — ED Notes (Signed)
Let RN Hannie know BP low at 76/45(56) RN Josh at bedside

## 2022-10-20 NOTE — ED Notes (Signed)
Patient is being discharged from the Urgent Care and sent to the Emergency Department via [facility] . Per Dr Marlinda Mike, patient is in need of higher level of care due to rectal bleeding, syncope. Patient is aware and verbalizes understanding of plan of care.  Vitals:   10/20/22 1017  BP: 99/64  Pulse: 87  Resp: 18  Temp: 99.2 F (37.3 C)  SpO2: 98%

## 2022-10-20 NOTE — H&P (Addendum)
NAME:  Timothy [name], MRN:  [id number], DOB:  21-Jul-1960, LOS: 0 ADMISSION DATE:  10/20/2022, CONSULTATION DATE: 10/20/2022 REFERRING MD: EDP, CHIEF COMPLAINT: Gastrointestinal hemorrhage  HISTORY OF PRESENT ILLNESS   This is a 62 year old male with a past medical history of autoimmune hepatitis previously on azathioprine, liver cirrhosis, hypertension who presents to the emergency department with concerns of hematochezia.  Patient had a 3-day history of hematochezia, and in the emergency department patient had large-volume hematemesis with bright red blood.  Hemoglobin found to be low at 4.9.  Emergency transfusion started, and patient admitted to ICU for further evaluation and management  SIGNIFICANT PAST MEDICAL HISTORY   Autoimmune hepatitis previously on azathioprine, liver cirrhosis, hypertension  SIGNIFICANT EVENTS:  10/20/2022: Patient admitted to ICU for gastrointestinal hemorrhage  STUDIES:   Initial CBC showed hemoglobin of 4.9 with hematocrit of 15.  Platelet count 46. PT 29.1 INR 2.8 CMP showing potassium of 2.3, creatinine 0.86 chloride 129, CO2 10, calcium 12.3, AST 29, ALT 17, albumin less than 1.5. Initial troponin 4 Lipase 37 Chest x-ray showing no acute cardiopulmonary processes Chest x-ray showing sinus bradycardia with a rate of 57, first-degree AV block noted with prolonged PR interval CULTURES:  N/A  ANTIBIOTICS:  N/A  LINES/TUBES:  Multiple peripheral IVs  CONSULTANTS:  Gastroenterology  SUBJECTIVE:  Patient evaluated at bedside after having large-volume hematemesis of bright red blood.  Patient states he has never had a GI bleed before.  He denies any history of hepatitis.  He denies any family history of liver disease.  He denies any history of stomach ulcers, hemorrhoids, or any other bleeding etiologies.  He states he does not use any ibuprofen, aspirin, or naproxen, BC powders, Goody powders.  He has never had an H. pylori infection.  He states that he  was completely fine before 3 days ago when he started having this bright red blood in his stool.  He states he has since became worse.  Patient had large volume hematemesis prior to my exam.  He states he has not had any vomiting before arriving to the ED.  This was his first episode.    CONSTITUTIONAL: BP 112/69   Pulse 83   Temp 98.2 F (36.8 C) (Oral)   Resp 16   SpO2 100%   No intake/output data recorded.        PHYSICAL EXAM: General: Resting in bed, no acute distress Neuro: Able to follow commands HEENT: Normocephalic atraumatic, pale conjunctive appreciated Cardiovascular: Regular rate and rhythm Lungs: Clear to auscultation bilaterally Abdomen: Soft, nontender, normal active bowel sounds  RESOLVED PROBLEM LIST   ASSESSMENT AND PLAN   This is a 62 year old male with past medical history of autoimmune hepatitis, previously on azathioprine, hypertension, liver cirrhosis who presents to the emergency department with concerns of hematochezia.  Patient found to have hemoglobin of 4.9 and patient admitted for further evaluation and management of GI bleed likely secondary to esophageal varices.  #Gastrointestinal bleed #Suspected esophageal varices #Acute blood loss anemia Patient presents to the emergency department with concerns hematochezia.  Patient also had large volume hematemesis.  Patient is actively bleeding from GI source.  Given history of liver cirrhosis, I do suspect he may have esophageal varices causing this bleed.  Hemoglobin at 4.9 initially.  Patient is not requiring pressors at this time, and volume resuscitation was proven effective.  Patient will need urgent scope for evaluation.  Given severely decreased hemoglobin, will give patient blood as well as fresh frozen plasma  and platelets. -Continue to monitor CBC -Transfuse two emergent packed red blood cell units, and then followed by 2 packed red blood cell units -Transfuse 1 emergent fresh frozen plasma,  followed by 1 unit of fresh frozen plasma -Transfuse 1 emergent platelets, followed by 1 unit of platelets -GI following, planning for urgent scope, giving Vitamin K  -Continue to monitor blood pressure to see if patient becomes hemodynamically unstable -Continue octreotide and PPI  -Hold spironolactone and Bystolic  #Autoimmune hepatitis #Liver cirrhosis #Hypoalbuminemia Per charting, patient used to be on azathioprine for autoimmune hepatitis.  Patient was lost to follow-up, and is no longer taking azathioprine.  Unclear on why patient was lost to follow-up.  Patient does not seem like he has abdominal distention.  Patient could benefit from hepatology follow-up in the outpatient setting. Will obtain imaging while patient is here.  Patient has hypoalbuminemia, likely secondary to cirrhosis. -Will continue with octreotide -Hold spironolactone and Bystolic -Start SBP prophylaxis -Plan for [place] while inpatient   #Thrombocytopenia Platelet count at 46 today.  This is likely in the setting of acute GI bleed as well as history of liver cirrhosis with splenic sequestration. -Transfuse platelets -Continue to trend CBC with platelets  #Hypokalemia Patient initially had potassium 2.3.  Will replete. -Recheck BMP in a.m.   Best Practice / Goals of Care / Disposition.   DVT PROPHYLAXIS: SCDs NUTRITION: N.p.o. MOBILITY: Ambulate as tolerated FAMILY DISCUSSIONS: N/A DISPOSITION plan to admit to ICU, will likely need urgent scope from GI  LABS  Glucose Recent Labs  Lab 10/20/22 1455  GLUCAP 96    BMET Recent Labs  Lab 10/20/22 1155  NA 144  K 2.3*  CL 129*  CO2 10*  BUN 23  CREATININE 0.86  GLUCOSE 73    Liver Enzymes Recent Labs  Lab 10/20/22 1155  AST 29  ALT 17  ALKPHOS 21*  BILITOT 0.8  ALBUMIN <1.5*    Electrolytes Recent Labs  Lab 10/20/22 1155  CALCIUM 4.3*    CBC Recent Labs  Lab 10/20/22 1155  WBC 6.1  HGB 4.9*  HCT 15.0*  PLT 46*    ABG No  results for input(s): "PHART", "PCO2ART", "PO2ART" in the last 168 hours.  Coag's Recent Labs  Lab 10/20/22 1155  INR 2.8*    Sepsis Markers No results for input(s): "LATICACIDVEN", "PROCALCITON", "O2SATVEN" in the last 168 hours.  Cardiac Enzymes No results for input(s): "TROPONINI", "PROBNP" in the last 168 hours.  PAST MEDICAL HISTORY :   He  has a past medical history of Autoimmune hepatitis, Cirrhosis of liver, Hypertension, and Thrombocytopenia.  PAST SURGICAL HISTORY:  He  has no past surgical history on file.  Allergies  Allergen Reactions   Nsaids     Patient denies any allergy to NSAIDs (particular Advil) He takes this medication frequently without any adverse side effects     No current facility-administered medications on file prior to encounter.   Current Outpatient Medications on File Prior to Encounter  Medication Sig   nebivolol (BYSTOLIC) 10 MG tablet Take 1 tablet (10 mg total) by mouth daily.   spironolactone (ALDACTONE) 50 MG tablet Take 1 tablet (50 mg total) by mouth daily.    FAMILY HISTORY:   His family history includes Hypertension in his sister.  SOCIAL HISTORY:  He  reports that he has never smoked. He has never used smokeless tobacco. He reports that he does not drink alcohol and does not use drugs.  REVIEW OF SYSTEMS:  Negative except for what is stated in HPI  Modena Slater, DO Internal Medicine PGY-1 Pager: [phone]

## 2022-10-20 NOTE — ED Triage Notes (Signed)
Pt transferred by  [facility] from the UC.  Pt went there d/t GI bleed from rectum.  Pt became dizzy in the bathroom at [facility] and had a syncopal episode with lots of blood in the toilet. Pt was hypotensive in the bathroom and hypotensive on arrival to ED.

## 2022-10-20 NOTE — Procedures (Addendum)
I discussed the findings and treatment plan with the patient's wife in person after the procedure Time provided for questions and answers and she thanked me for our help  I am inserting pictures from endoscopy below as they would not load into the endoscopy software during the case.

## 2022-10-20 NOTE — ED Provider Notes (Signed)
[place] EMERGENCY DEPARTMENT AT [facility] Provider Note   CSN: [id number] Arrival date & time: 10/20/22  1133     History  No chief complaint on file.   Timothy [name] is a 62 y.o. male.  62 year old male presents with EMS from urgent care.  Patient presented to urgent care with complaint of 3 days of bloody stools.  Patient had a syncopal event in the urgent care bathroom.  Patient was unresponsive at urgent care.  EMS reports bloody stool in toilet.  During transport the patient the patient regained consciousness.  Upon arrival to the ED he is alert and oriented x 3.  Patient is noted to be hypotensive with a blood pressure in the 70s.  Patient reports prior history of cirrhosis and hypertension.  Patient denies prior history of known esophageal varices.  Patient reports that prior GI care had occurred at [facility] multiple years ago.  He is without current GI care.    The history is provided by the patient and medical records.       Home Medications Prior to Admission medications   Medication Sig Start Date End Date Taking? Authorizing Provider  nebivolol (BYSTOLIC) 10 MG tablet Take 1 tablet (10 mg total) by mouth daily. 04/29/22  Yes Anders Simmonds, PA-C  spironolactone (ALDACTONE) 50 MG tablet Take 1 tablet (50 mg total) by mouth daily. 04/29/22  Yes Anders Simmonds, PA-C      Allergies    Nsaids    Review of Systems   Review of Systems  All other systems reviewed and are negative.   Physical Exam Updated Vital Signs BP 95/79   Pulse 92   Temp 98.6 F (37 C) (Oral)   Resp 18   SpO2 98%  Physical Exam Vitals and nursing note reviewed.  Constitutional:      General: He is not in acute distress.    Appearance: Normal appearance. He is well-developed.  HENT:     Head: Normocephalic and atraumatic.  Eyes:     Pupils: Pupils are equal, round, and reactive to light.     Comments: Pale conjunctiva   Cardiovascular:     Rate and Rhythm: Normal  rate and regular rhythm.     Heart sounds: Normal heart sounds.  Pulmonary:     Effort: Pulmonary effort is normal. No respiratory distress.     Breath sounds: Normal breath sounds.  Abdominal:     General: There is no distension.     Palpations: Abdomen is soft.     Tenderness: There is no abdominal tenderness.  Musculoskeletal:        General: No deformity. Normal range of motion.     Cervical back: Normal range of motion and neck supple.  Skin:    General: Skin is warm and dry.  Neurological:     General: No focal deficit present.     Mental Status: He is alert and oriented to person, place, and time. Mental status is at baseline.     ED Results / Procedures / Treatments   Labs (all labs ordered are listed, but only abnormal results are displayed) Labs Reviewed  CBC WITH DIFFERENTIAL/PLATELET - Abnormal; Notable for the following components:      Result Value   RBC 1.58 (*)    Hemoglobin 4.9 (*)    HCT 15.0 (*)    RDW 16.3 (*)    Platelets 46 (*)    All other components within normal limits  PROTIME-INR - Abnormal;  Notable for the following components:   Prothrombin Time 29.1 (*)    INR 2.8 (*)    All other components within normal limits  COMPREHENSIVE METABOLIC PANEL  ETHANOL  LIPASE, BLOOD  I-STAT CHEM 8, ED  TYPE AND SCREEN  PREPARE RBC (CROSSMATCH)  ABO/RH  PREPARE RBC (CROSSMATCH)  TROPONIN I (HIGH SENSITIVITY)  TROPONIN I (HIGH SENSITIVITY)    EKG None  Radiology DG Chest Port 1 View  Result Date: 10/20/2022 CLINICAL DATA:  Syncope EXAM: PORTABLE CHEST 1 VIEW COMPARISON:  Chest radiograph 02/07/2008 FINDINGS: The heart is borderline enlarged. The upper mediastinal contours are normal There is no focal consolidation or pulmonary edema. There is no pleural effusion or pneumothorax There is no acute osseous abnormality. IMPRESSION: No radiographic evidence of acute cardiopulmonary process. Electronically Signed   By: Lesia Hausen M.D.   On: 10/20/2022  12:18    Procedures Procedures    Medications Ordered in ED Medications  octreotide (SANDOSTATIN) 2 mcg/mL load via infusion 50 mcg (50 mcg Intravenous Bolus from Bag 10/20/22 1229)    And  octreotide (SANDOSTATIN) 500 mcg in sodium chloride 0.9 % 250 mL (2 mcg/mL) infusion (0 mcg/hr Intravenous Stopped 10/20/22 1306)  0.9 %  sodium chloride infusion (Manually program via Guardrails IV Fluids) (has no administration in time range)  0.9 %  sodium chloride infusion (Manually program via Guardrails IV Fluids) (has no administration in time range)  sodium chloride 0.9 % bolus 1,000 mL (has no administration in time range)  cefTRIAXone (ROCEPHIN) 1 g in sodium chloride 0.9 % 100 mL IVPB (has no administration in time range)  sodium chloride 0.9 % bolus 1,000 mL (0 mLs Intravenous Stopped 10/20/22 1245)  pantoprazole (PROTONIX) 80 mg /NS 100 mL IVPB (0 mg Intravenous Stopped 10/20/22 1318)  sodium chloride 0.9 % bolus 1,000 mL (0 mLs Intravenous Stopped 10/20/22 1255)  ondansetron (ZOFRAN) injection 4 mg (4 mg Intravenous Given 10/20/22 1245)    ED Course/ Medical Decision Making/ A&P                             Medical Decision Making Amount and/or Complexity of Data Reviewed Labs: ordered. Radiology: ordered.  Risk Prescription drug management. Decision regarding hospitalization.    Medical Screen Complete  This patient presented to the ED with complaint of syncope, GI bleed.  This complaint involves an extensive number of treatment options. The initial differential diagnosis includes, but is not limited to, GI bleed, metabolic abnormality, etc.  This presentation is: Acute, Self-Limited, Previously Undiagnosed, Uncertain Prognosis, Complicated, Systemic Symptoms, and Threat to Life/Bodily Function  Patient with known history of cirrhosis and hypertension presents with 3 days of bloody stools.  Patient had brief syncopal event in urgent care.  He was transported to the ED.  On  arrival to the ED the patient is hypotensive with a blood pressure in the 70s.  Patient with known history of cirrhosis.  Patient without history of esophageal varices.  Blood pressure improved with IV fluid resuscitation.  Patient mentating well during entirety of ED evaluation.  Patient started on Protonix, octreotide, IV fluids.  Type specific PRBC transfusion ordered.  Shortly after arrival patient had large-volume hematemesis.  Patient did not lose consciousness with this event.  Patient did not drop blood pressure after this event.  Airway protected.  Critical care made aware of case and will evaluate for admission.  [facility] made aware of case and will evaluate for  emergent endoscopy.   Additional history obtained:  Additional history obtained from EMS External records from outside sources obtained and reviewed including prior ED visits and prior Inpatient records.    Lab Tests:  I ordered and personally interpreted labs.  The pertinent results include: CBC, INR, CMP, troponin, type and screen, lipase   Imaging Studies ordered:  I ordered imaging studies including chest x-ray  I independently visualized and interpreted obtained imaging which showed NAD I agree with the radiologist interpretation.   Cardiac Monitoring:  The patient was maintained on a cardiac monitor.  I personally viewed and interpreted the cardiac monitor which showed an underlying rhythm of: NSR   Medicines ordered:  I ordered medication including IVF, PRBC, octreotide, Protonix for suspected GI bleed Reevaluation of the patient after these medicines showed that the patient: improved   Consultations Obtained:  I consulted critical care, GI,  and discussed lab and imaging findings as well as pertinent plan of care.    Problem List / ED Course:  Upper GI bleed, hematemesis   Reevaluation:  After the interventions noted above, I reevaluated the patient and found that they have:  improved  Disposition:  After consideration of the diagnostic results and the patients response to treatment, I feel that the patent would benefit from admission.   CRITICAL CARE Performed by: Wynetta Fines   Total critical care time: 45 minutes  Critical care time was exclusive of separately billable procedures and treating other patients.  Critical care was necessary to treat or prevent imminent or life-threatening deterioration.  Critical care was time spent personally by me on the following activities: development of treatment plan with patient and/or surrogate as well as nursing, discussions with consultants, evaluation of patient's response to treatment, examination of patient, obtaining history from patient or surrogate, ordering and performing treatments and interventions, ordering and review of laboratory studies, ordering and review of radiographic studies, pulse oximetry and re-evaluation of patient's condition.          Final Clinical Impression(s) / ED Diagnoses Final diagnoses:  Gastrointestinal hemorrhage, unspecified gastrointestinal hemorrhage type    Rx / DC Orders ED Discharge Orders     None         Wynetta Fines, MD 10/20/22 1356

## 2022-10-20 NOTE — Consult Note (Signed)
Consultation Note   Referring Provider:  PCCM PCP: Storm Frisk, MD Primary Gastroenterologist: Remotely Dr. Christella Hartigan    Reason for consultation: upper Gi bleed, cirrhosis  Hospital Day: 1   Assessment   62 yo male with:   Cirrhosis, possibly decompensated ( ? 2/2 to autoimmune hepatitis) with acute hemodynamically unstable upper GI bleed with rectal bleeding and massive hematemesis. Variceal bleed is high on differential list   MELD 3.0: 11  MELD-Na: 18  -No details about cirrhosis available in Epic or Care Everywhere other than office visit with Dr. Christella Hartigan in 2009. Sounds like he hasn't been followed by GI / Hepatology in many years.  -Takes aldactone at home so possibly has a history of ascites  Hypokalemia, K+ 2.8 / Hypocalcemia, Ca+ 4.3 (serum albumin < 1.5)  Repletion per admitting team  HTN, on Bystolic at home  Plan   -continue octreotide infusion -continue pantoprazole infusion -will add Rocephin for SBP prophylaxis. When stable can get [place] to evaluate for ascites and if positive get fluid studies. -will give a dose of IV Vitamin K -2 units of blood ordered and starting now.  -he will need EGD, possibly today if can get at least a unit of blood in and electrolytes corrected. We spoke with PCCM. They will sedate him at bedside. If vomits again then will intubate. The risks and benefits of EGD were discussed with the patient who agrees to proceed.   -AFP. Eventual RUQ for HCC screening  History of Present Illness   Patient is a 62 y.o. year old male with a past medical history of cirrhosis, autoimmune hepatitis,  HTN, renal stones     See PMH for any additional medical problems.  He saw Dr. Christella Hartigan in 2009, we ordered a liver biopsy but it wasn't done. He hasn't seen [place] since. He says he has not been followed by GI or Hepatology in many years. In Epic there is a referral to Annamarie Major, NP with [facility] in Feb  2021. Not sure who referred him there, maybe his PCP  ED WORKUP:   Patient presented to Urgent Care this am with rectal bleeding. He has been passing red blood for three days. He syncopal episode and brought to ED. Initially hypotensive but improved with IV fluids. In the ED he had massive hematemesis with ~ 1200 ml of red blood. He hasn't had any abdominal pain. He doesn't take NSAID. He doesn't know the etiology of cirrhosis though autoimmune hepatitis is listed in PMH. Patient has no history of significant Etoh use.   WBC 6.1, hgb 4.9 ( baseline ~12), platelets 46. CMP is pending. INR 2.8.    Previous GI History / Evaluation :  None   Imaging and Labs    Recent Labs    10/20/22 1155  WBC 6.1  HGB 4.9*  HCT 15.0*  PLT PENDING   No results for input(s): "NA", "K", "CL", "CO2", "GLUCOSE", "BUN", "CREATININE", "CALCIUM" in the last 72 hours. No results for input(s): "PROT", "ALBUMIN", "AST", "ALT", "ALKPHOS", "BILITOT", "BILIDIR", "IBILI" in the last 72 hours. No results for input(s): "HEPBSAG", "HCVAB", "HEPAIGM", "HEPBIGM" in the last 72 hours. Recent Labs    10/20/22 1155  LABPROT 29.1*  INR 2.8*  Past Medical History:  Diagnosis Date   Autoimmune hepatitis    Cirrhosis of liver    Hypertension    Thrombocytopenia     No past surgical history on file.  Family History  Problem Relation Age of Onset   Hypertension Sister     Prior to Admission medications   Medication Sig Start Date End Date Taking? Authorizing Provider  clobetasol cream (TEMOVATE) 0.05 % Apply 1 Application topically 2 (two) times daily. Patient not taking: Reported on 04/29/2022 01/27/22   Storm Frisk, MD  nebivolol (BYSTOLIC) 10 MG tablet Take 1 tablet (10 mg total) by mouth daily. 04/29/22   Anders Simmonds, PA-C  spironolactone (ALDACTONE) 50 MG tablet Take 1 tablet (50 mg total) by mouth daily. 04/29/22   Anders Simmonds, PA-C    Current Facility-Administered Medications   Medication Dose Route Frequency Provider Last Rate Last Admin   0.9 %  sodium chloride infusion (Manually program via Guardrails IV Fluids)   Intravenous Once Wynetta Fines, MD       0.9 %  sodium chloride infusion (Manually program via Guardrails IV Fluids)   Intravenous Once Wynetta Fines, MD       octreotide (SANDOSTATIN) 500 mcg in sodium chloride 0.9 % 250 mL (2 mcg/mL) infusion  50 mcg/hr Intravenous Continuous Wynetta Fines, MD 25 mL/hr at 10/20/22 1227 50 mcg/hr at 10/20/22 1227   ondansetron (ZOFRAN) injection 4 mg  4 mg Intravenous Once Wynetta Fines, MD       sodium chloride 0.9 % bolus 1,000 mL  1,000 mL Intravenous Once Wynetta Fines, MD       sodium chloride 0.9 % bolus 1,000 mL  1,000 mL Intravenous Once Wynetta Fines, MD       Current Outpatient Medications  Medication Sig Dispense Refill   clobetasol cream (TEMOVATE) 0.05 % Apply 1 Application topically 2 (two) times daily. (Patient not taking: Reported on 04/29/2022) 30 g 0   nebivolol (BYSTOLIC) 10 MG tablet Take 1 tablet (10 mg total) by mouth daily. 90 tablet 1   spironolactone (ALDACTONE) 50 MG tablet Take 1 tablet (50 mg total) by mouth daily. 90 tablet 1    Allergies as of 10/20/2022 - Review Complete 10/20/2022  Allergen Reaction Noted   Nsaids  04/27/2022    Social History   Socioeconomic History   Marital status: Single    Spouse name: Not on file   Number of children: Not on file   Years of education: Not on file   Highest education level: Not on file  Occupational History   Not on file  Tobacco Use   Smoking status: Never   Smokeless tobacco: Never  Vaping Use   Vaping Use: Never used  Substance and Sexual Activity   Alcohol use: No    Alcohol/week: 0.0 standard drinks of alcohol   Drug use: No   Sexual activity: Yes  Other Topics Concern   Not on file  Social History Narrative   Not on file   Social Determinants of Health   Financial Resource Strain: Not on file  Food  Insecurity: Not on file  Transportation Needs: Not on file  Physical Activity: Not on file  Stress: Not on file  Social Connections: Not on file  Intimate Partner Violence: Not on file    Review of Systems: All systems reviewed and negative except where noted in HPI.  Physical Exam: Vital signs in last 24 hours: Temp:  [99.2 F (  37.3 C)] 99.2 F (37.3 C) (04/16 1017) Pulse Rate:  [73-107] 78 (04/16 1210) Resp:  [13-27] 17 (04/16 1210) BP: (71-138)/(51-100) 110/96 (04/16 1210) SpO2:  [97 %-100 %] 100 % (04/16 1210)    General:  Alert male in NAD Psych:  Pleasant, cooperative. Normal mood and affect Eyes: Pupils equal Ears:  Normal auditory acuity Nose: No deformity, discharge or lesions Neck:  Supple, no masses felt Lungs:  Clear to auscultation.  Heart:  sinus tachycardia  Abdomen:  Soft, nondistended, nontender, active bowel sounds, no masses felt Rectal :  Deferred Msk: Symmetrical without gross deformities.  Neurologic:  Alert, oriented, grossly normal neurologically Extremities : 1+ BLE edema Skin:  Intact without significant lesions.    Intake/Output from previous day: No intake/output data recorded. Intake/Output this shift:  Total I/O In: 1000 [IV Piggyback:1000] Out: -     Active Problems:   * No active hospital problems. Willette Cluster, NP-C @  10/20/2022, 1:08 PM

## 2022-10-20 NOTE — Progress Notes (Signed)
Dr. Denese Killings notified of Trop of 48 and magnesium level of 1.7.

## 2022-10-20 NOTE — Procedures (Signed)
Central Venous Catheter Insertion Procedure Note  Timothy [name]  [id number]  08-May-1961  Date:10/20/22  Time:4:24 PM   Provider Performing:Yohann Curl   Procedure: Insertion of Non-tunneled Central Venous [phone]) with US guidance ([id number])   Indication(s) Medication administration and Difficult access  Consent Risks of the procedure as well as the alternatives and risks of each were explained to the patient and/or caregiver.  Consent for the procedure was obtained and is signed in the bedside chart  Anesthesia Topical only with 1% lidocaine   Timeout Verified patient identification, verified procedure, site/side was marked, verified correct patient position, special equipment/implants available, medications/allergies/relevant history reviewed, required imaging and test results available.  Sterile Technique Maximal sterile technique including full sterile barrier drape, hand hygiene, sterile gown, sterile gloves, mask, hair covering, sterile ultrasound probe cover (if used).  Procedure Description Area of catheter insertion was cleaned with chlorhexidine and draped in sterile fashion.  With real-time ultrasound guidance a central venous catheter was placed into the left subclavian vein. Nonpulsatile blood flow and easy flushing noted in all ports.  The catheter was sutured in place and sterile dressing applied.    Complications/Tolerance None; patient tolerated the procedure well. Chest X-ray is ordered to verify placement for internal jugular or subclavian cannulation.   Chest x-ray is not ordered for femoral cannulation.  EBL Minimal  Specimen(s) None  Lynnell Catalan, MD [facility] ICU Physician [facility]  Pager: [phone] Or Epic Secure Chat After hours: [phone].  10/20/2022, 4:24 PM

## 2022-10-20 NOTE — ED Notes (Signed)
Blood rate increased to 300 mL/hr

## 2022-10-20 NOTE — Procedures (Signed)
Intubation Procedure Note  Timothy [name]  [id number]  Nov 26, 1960  Date:10/20/22  Time:4:21 PM   Provider Performing:Archana Eckman    Procedure: Intubation (31500)  Indication(s) Procedural airway protection.   Consent Risks of the procedure as well as the alternatives and risks of each were explained to the patient and/or caregiver.  Consent for the procedure was obtained and is signed in the bedside chart   Anesthesia Etomidate, Fentanyl, Rocuronium, and Propofol   Time Out Verified patient identification, verified procedure, site/side was marked, verified correct patient position, special equipment/implants available, medications/allergies/relevant history reviewed, required imaging and test results available. MP 1, good neck mobility,    Sterile Technique Usual hand hygeine, masks, and gloves were used   Procedure Description Patient positioned in bed supine.  Sedation given as noted above.  Patient was intubated with endotracheal tube using  MAC4 .  View was Grade 1 full glottis .  Number of attempts was 1.  Colorimetric CO2 detector was consistent with tracheal placement.   Complications/Tolerance None; patient tolerated the procedure well. Chest X-ray is ordered to verify placement.   EBL none   Specimen(s) None   Lynnell Catalan, MD [facility] ICU Physician [facility] Critical Care  Pager: [phone] Or Epic Secure Chat After hours: [phone].  10/20/2022, 4:23 PM

## 2022-10-20 NOTE — ED Provider Notes (Signed)
[facility]    CSN: [id number] Arrival date & time: 10/20/22  0846      History   Chief Complaint Chief Complaint  Patient presents with   Rectal Bleeding    HPI Timothy [name] is a 62 y.o. male.   Patient presents today with a 3-day history of hematochezia.  He describes this as frank blood in the toilet and on toilet tissue after having a bowel movement.  This is occurring 3 times per day for the past 3 days.  Denies any associated abdominal pain, nausea, vomiting, urinary symptoms, fever.  Does report some dizziness but denies any syncope, lightheadedness, chest pain, palpitations, shortness of breath.  He does not take any blood thinning medications.  He does have a history of thrombocytopenia associated with autoimmune hepatitis resulting in cirrhosis and last platelet count was 46 on 04/29/2022.  He denies any additional bleeding including hematuria or significant bruising.  He denies history of internal hemorrhoids or previous bright red blood per rectum.  He denies any rectal pain.    Past Medical History:  Diagnosis Date   Autoimmune hepatitis    Cirrhosis of liver    Hypertension    Thrombocytopenia     Patient Active Problem List   Diagnosis Date Noted   Multiple benign nevi 01/27/2022   Thrombocytopenia 10/24/2015   Liver cirrhosis 10/24/2015   Hepatitis C 10/24/2015   Autoimmune hepatitis 03/28/2013   Essential hypertension 11/15/2012    History reviewed. No pertinent surgical history.     Home Medications    Prior to Admission medications   Medication Sig Start Date End Date Taking? Authorizing Provider  nebivolol (BYSTOLIC) 10 MG tablet Take 1 tablet (10 mg total) by mouth daily. 04/29/22  Yes Anders Simmonds, PA-C  spironolactone (ALDACTONE) 50 MG tablet Take 1 tablet (50 mg total) by mouth daily. 04/29/22  Yes Georgian Co M, PA-C  clobetasol cream (TEMOVATE) 0.05 % Apply 1 Application topically 2 (two) times daily. Patient not taking:  Reported on 04/29/2022 01/27/22   Storm Frisk, MD    Family History Family History  Problem Relation Age of Onset   Hypertension Sister     Social History Social History   Tobacco Use   Smoking status: Never   Smokeless tobacco: Never  Vaping Use   Vaping Use: Never used  Substance Use Topics   Alcohol use: No    Alcohol/week: 0.0 standard drinks of alcohol   Drug use: No     Allergies   Nsaids   Review of Systems Review of Systems  Constitutional:  Positive for activity change. Negative for appetite change, fatigue and fever.  Respiratory:  Negative for shortness of breath.   Cardiovascular:  Negative for chest pain.  Gastrointestinal:  Positive for blood in stool and nausea. Negative for abdominal pain, constipation, diarrhea, rectal pain and vomiting.  Neurological:  Positive for dizziness. Negative for syncope, weakness, light-headedness and headaches.     Physical Exam Triage Vital Signs ED Triage Vitals [10/20/22 1017]  Enc Vitals Group     BP 99/64     Pulse Rate 87     Resp 18     Temp 99.2 F (37.3 C)     Temp Source Oral     SpO2 98 %     Weight      Height      Head Circumference      Peak Flow      Pain Score  Pain Loc      Pain Edu?      Excl. in GC?    No data found.  Updated Vital Signs BP 99/64 (BP Location: Left Arm)   Pulse 87   Temp 99.2 F (37.3 C) (Oral)   Resp 18   SpO2 98%   Visual Acuity Right Eye Distance:   Left Eye Distance:   Bilateral Distance:    Right Eye Near:   Left Eye Near:    Bilateral Near:     Physical Exam Vitals reviewed.  Constitutional:      General: He is awake.     Appearance: Normal appearance. He is well-developed. He is not ill-appearing.     Comments: Very pleasant male appears stated age in no acute distress sitting comfortably in exam room  HENT:     Head: Normocephalic and atraumatic.     Mouth/Throat:     Pharynx: Uvula midline. No oropharyngeal exudate or posterior  oropharyngeal erythema.  Cardiovascular:     Rate and Rhythm: Normal rate and regular rhythm.     Heart sounds: Normal heart sounds, S1 normal and S2 normal. No murmur heard. Pulmonary:     Effort: Pulmonary effort is normal.     Breath sounds: Normal breath sounds. No stridor. No wheezing, rhonchi or rales.     Comments: Clear auscultation bilaterally Abdominal:     General: Bowel sounds are normal.     Palpations: Abdomen is soft.     Tenderness: There is no abdominal tenderness. There is no right CVA tenderness, left CVA tenderness, guarding or rebound.     Comments: Benign abdominal exam  Genitourinary:    Rectum: No tenderness, anal fissure, external hemorrhoid or internal hemorrhoid.     Comments: Dark substance surrounding rectum Neurological:     Mental Status: He is alert.  Psychiatric:        Behavior: Behavior is cooperative.      UC Treatments / Results  Labs (all labs ordered are listed, but only abnormal results are displayed) Labs Reviewed  POC HEMOCCULT BLD/STL (OFFICE/1-CARD/DIAGNOSTIC) - Abnormal; Notable for the following components:      Result Value   Fecal Occult Blood, POC Positive (*)    All other components within normal limits    EKG   Radiology No results found.  Procedures Procedures (including critical care time)  Medications Ordered in UC Medications - No data to display  Initial Impression / Assessment and Plan / UC Course  I have reviewed the triage vital signs and the nursing notes.  Pertinent labs & imaging results that were available during my care of the patient were reviewed by me and considered in my medical decision making (see chart for details).     Patient was well-appearing but did have soft blood pressure.  He did have what appeared to be dried blood versus blood/stool combination around his rectum.  Hemoccult was positive.  During his visit he went to the bathroom where he then had a syncopal episode.  EMS was called  for transport as he continued to have altered level of consciousness and he was taken to [facility], ER.  Final Clinical Impressions(s) / UC Diagnoses   Final diagnoses:  Syncope and collapse  Rectal bleeding  Altered level of consciousness   Discharge Instructions   None    ED Prescriptions   None    PDMP not reviewed this encounter.   Jeani Hawking, PA-C 10/20/22 1107

## 2022-10-20 NOTE — Op Note (Signed)
[facility] Patient Name: Timothy [name] Procedure Date : 10/20/2022 MRN: [id number] Attending MD: Beverley Fiedler , MD, [id number] Date of Birth: 11/29/1960 CSN: [id number] Age: 62 Admit Type: Inpatient Procedure:                Upper GI endoscopy Indications:              Hematemesis, Active gastrointestinal bleeding in                            patient with hx of cirrhosis (no recent follow-up,                            possible hx of AIH) Providers:                [name], MD, Melany Guernsey, Technician Referring MD:             Pulmonary Critical Care Medicine Medicines:                IV sedation per ICU team Complications:            No immediate complications. Estimated Blood Loss:     Estimated blood loss: none. Procedure:                Pre-Anesthesia Assessment:                           - Prior to the procedure, a History and Physical                            was performed, and patient medications and                            allergies were reviewed. The patient's tolerance of                            previous anesthesia was also reviewed. The risks                            and benefits of the procedure and the sedation                            options and risks were discussed with the patient.                            All questions were answered, and informed consent                            was obtained. Prior Anticoagulants: The patient has                            taken no anticoagulant or antiplatelet agents. ASA                            Grade Assessment: III - A patient with severe  systemic disease. After reviewing the risks and                            benefits, the patient was deemed in satisfactory                            condition to undergo the procedure.                           After obtaining informed consent, the endoscope was                            passed under direct vision. Throughout the                             procedure, the patient's blood pressure, pulse, and                            oxygen saturations were monitored continuously. The                            [id number]-[id number] ([id number]) Olympus endoscope was introduced                            through the mouth, and advanced to the second part                            of duodenum. The upper GI endoscopy was                            accomplished without difficulty. The patient                            tolerated the procedure well. Scope In: Scope Out: Findings:      Four columns of large (> 5 mm) varices oozing blood were found in the       middle third of the esophagus and in the lower third of the esophagus,       33 to 40 cm from the incisors. Red wale signs were present with one       nipple sign. Seven bands were successfully placed with decompression and       incomplete eradication of varices but with good treatment effect. There       was no bleeding at the end of the procedure.      Clotted blood was found in the gastric fundus. This prevented complete       visualization of the fundus.      There is no endoscopic evidence of varices in the cardia and in the       visualized gastric fundus.      A single 5 mm sessile polyp with no bleeding was found in the second       portion of the duodenum. Polypectomy was not attempted due to the       patient being hemodynamically unstable. Impression:               - Large (>  5 mm) esophageal varices oozing blood, 1                            with fibrin clot. Banded x 7.                           - Clotted blood in the gastric fundus, but no                            gastric varices seen.                           - A single duodenal polyp. Resection not attempted.                           - No specimens collected.                           - Pictures not obtained due to issue with Provation                            (endoscopic software), but taken and added  to the                            EMR via Epic/CHL. Moderate Sedation:      per ICU team Recommendation:           - Return patient to ICU for ongoing care.                           - Advance diet as tolerated.                           - Continue present medications including octreotide                            and pantoprazole infusion (octreotide x 72 hrs).                            Empiric antibiotics x 5 days until we know                            definitively if he has ascites.                           - Complete abd [place] while here.                           - Repeat upper endoscopy in 4-6 weeks for                            retreatment.                           - Initiation of nonselective beta blocker when  stable and medical appropriate is recommended. Procedure Code(s):        --- Professional ---                           [phone], Esophagogastroduodenoscopy, flexible,                            transoral; with band ligation of esophageal/gastric                            varices Diagnosis Code(s):        --- Professional ---                           I85.01, Esophageal varices with bleeding                           K92.2, Gastrointestinal hemorrhage, unspecified                           K31.7, Polyp of stomach and duodenum                           K92.0, Hematemesis CPT copyright 2022 [employer]. All rights reserved. The codes documented in this report are preliminary and upon coder review may  be revised to meet current compliance requirements. Beverley Fiedler, MD 10/20/2022 5:17:53 PM This report has been signed electronically. Number of Addenda: 0

## 2022-10-20 NOTE — ED Triage Notes (Signed)
Pt reports rectal bleeding x 3 days.

## 2022-10-21 DIAGNOSIS — I8501 Esophageal varices with bleeding: Secondary | ICD-10-CM

## 2022-10-21 DIAGNOSIS — K920 Hematemesis: Secondary | ICD-10-CM

## 2022-10-21 DIAGNOSIS — K922 Gastrointestinal hemorrhage, unspecified: Secondary | ICD-10-CM

## 2022-10-21 DIAGNOSIS — K7460 Unspecified cirrhosis of liver: Principal | ICD-10-CM

## 2022-10-21 DIAGNOSIS — D62 Acute posthemorrhagic anemia: Secondary | ICD-10-CM

## 2022-10-21 DIAGNOSIS — E876 Hypokalemia: Secondary | ICD-10-CM

## 2022-10-21 DIAGNOSIS — N179 Acute kidney failure, unspecified: Secondary | ICD-10-CM

## 2022-10-21 DIAGNOSIS — K317 Polyp of stomach and duodenum: Secondary | ICD-10-CM

## 2022-10-21 LAB — HEPATIC FUNCTION PANEL
ALT: 34 U/L (ref 0–44)
AST: 54 U/L — ABNORMAL HIGH (ref 15–41)
Albumin: 2 g/dL — ABNORMAL LOW (ref 3.5–5.0)
Alkaline Phosphatase: 41 U/L (ref 38–126)
Bilirubin, Direct: 0.2 mg/dL (ref 0.0–0.2)
Indirect Bilirubin: 1 mg/dL — ABNORMAL HIGH (ref 0.3–0.9)
Total Bilirubin: 1.2 mg/dL (ref 0.3–1.2)
Total Protein: 5.3 g/dL — ABNORMAL LOW (ref 6.5–8.1)

## 2022-10-21 LAB — BPAM RBC
Blood Product Expiration Date: 202405112359
ISSUE DATE / TIME: 202404161257
ISSUE DATE / TIME: 202404161257
Unit Type and Rh: 6200

## 2022-10-21 LAB — CBC WITH DIFFERENTIAL/PLATELET
Abs Immature Granulocytes: 0.05 10*3/uL (ref 0.00–0.07)
Abs Immature Granulocytes: 0.09 10*3/uL — ABNORMAL HIGH (ref 0.00–0.07)
Abs Immature Granulocytes: 0.11 10*3/uL — ABNORMAL HIGH (ref 0.00–0.07)
Basophils Absolute: 0.1 10*3/uL (ref 0.0–0.1)
Basophils Absolute: 0.1 10*3/uL (ref 0.0–0.1)
Basophils Absolute: 0.1 10*3/uL (ref 0.0–0.1)
Basophils Relative: 1 %
Basophils Relative: 1 %
Basophils Relative: 1 %
Eosinophils Absolute: 0.2 10*3/uL (ref 0.0–0.5)
Eosinophils Absolute: 0.5 10*3/uL (ref 0.0–0.5)
Eosinophils Absolute: 0.5 10*3/uL (ref 0.0–0.5)
Eosinophils Relative: 2 %
Eosinophils Relative: 4 %
Eosinophils Relative: 5 %
HCT: 24.6 % — ABNORMAL LOW (ref 39.0–52.0)
HCT: 28.8 % — ABNORMAL LOW (ref 39.0–52.0)
HCT: 24.2 % — ABNORMAL LOW (ref 39.0–52.0)
Hemoglobin: 8.6 g/dL — ABNORMAL LOW (ref 13.0–17.0)
Hemoglobin: 9.2 g/dL — ABNORMAL LOW (ref 13.0–17.0)
Hemoglobin: 8.2 g/dL — ABNORMAL LOW (ref 13.0–17.0)
Immature Granulocytes: 0 %
Immature Granulocytes: 1 %
Immature Granulocytes: 1 %
Lymphocytes Relative: 27 %
Lymphocytes Relative: 30 %
Lymphocytes Relative: 30 %
Lymphs Abs: 3.5 10*3/uL (ref 0.7–4.0)
Lymphs Abs: 3.7 10*3/uL (ref 0.7–4.0)
Lymphs Abs: 3 10*3/uL (ref 0.7–4.0)
MCH: 30.1 pg (ref 26.0–34.0)
MCH: 29.1 pg (ref 26.0–34.0)
MCH: 29.7 pg (ref 26.0–34.0)
MCHC: 35 g/dL (ref 30.0–36.0)
MCHC: 31.9 g/dL (ref 30.0–36.0)
MCHC: 33.9 g/dL (ref 30.0–36.0)
MCV: 86 fL (ref 80.0–100.0)
MCV: 91.1 fL (ref 80.0–100.0)
MCV: 87.7 fL (ref 80.0–100.0)
Monocytes Absolute: 1.3 10*3/uL — ABNORMAL HIGH (ref 0.1–1.0)
Monocytes Absolute: 1.3 10*3/uL — ABNORMAL HIGH (ref 0.1–1.0)
Monocytes Absolute: 1.1 10*3/uL — ABNORMAL HIGH (ref 0.1–1.0)
Monocytes Relative: 10 %
Monocytes Relative: 10 %
Monocytes Relative: 11 %
Neutro Abs: 7.8 10*3/uL — ABNORMAL HIGH (ref 1.7–7.7)
Neutro Abs: 7 10*3/uL (ref 1.7–7.7)
Neutro Abs: 5.3 10*3/uL (ref 1.7–7.7)
Neutrophils Relative %: 60 %
Neutrophils Relative %: 54 %
Neutrophils Relative %: 52 %
Platelets: 73 10*3/uL — ABNORMAL LOW (ref 150–400)
Platelets: 65 10*3/uL — ABNORMAL LOW (ref 150–400)
Platelets: 64 10*3/uL — ABNORMAL LOW (ref 150–400)
RBC: 2.86 MIL/uL — ABNORMAL LOW (ref 4.22–5.81)
RBC: 3.16 MIL/uL — ABNORMAL LOW (ref 4.22–5.81)
RBC: 2.76 MIL/uL — ABNORMAL LOW (ref 4.22–5.81)
RDW: 16.3 % — ABNORMAL HIGH (ref 11.5–15.5)
RDW: 17 % — ABNORMAL HIGH (ref 11.5–15.5)
RDW: 16.6 % — ABNORMAL HIGH (ref 11.5–15.5)
WBC: 12.9 10*3/uL — ABNORMAL HIGH (ref 4.0–10.5)
WBC: 12.6 10*3/uL — ABNORMAL HIGH (ref 4.0–10.5)
WBC: 10.1 10*3/uL (ref 4.0–10.5)
nRBC: 0 % (ref 0.0–0.2)
nRBC: 0 % (ref 0.0–0.2)
nRBC: 0 % (ref 0.0–0.2)

## 2022-10-21 LAB — PREPARE PLATELET PHERESIS: Unit division: 0

## 2022-10-21 LAB — TYPE AND SCREEN
ABO/RH(D): A POS
Unit division: 0

## 2022-10-21 LAB — PREPARE FRESH FROZEN PLASMA

## 2022-10-21 LAB — BASIC METABOLIC PANEL
Anion gap: 8 (ref 5–15)
BUN: 46 mg/dL — ABNORMAL HIGH (ref 8–23)
CO2: 17 mmol/L — ABNORMAL LOW (ref 22–32)
Calcium: 7.4 mg/dL — ABNORMAL LOW (ref 8.9–10.3)
Chloride: 116 mmol/L — ABNORMAL HIGH (ref 98–111)
Creatinine, Ser: 1.79 mg/dL — ABNORMAL HIGH (ref 0.61–1.24)
GFR, Estimated: 42 mL/min — ABNORMAL LOW (ref >60)
Glucose, Bld: 128 mg/dL — ABNORMAL HIGH (ref 70–99)
Potassium: 5 mmol/L (ref 3.5–5.1)
Sodium: 141 mmol/L (ref 135–145)

## 2022-10-21 LAB — BPAM PLATELET PHERESIS
Blood Product Expiration Date: 202404172359
ISSUE DATE / TIME: 202404161654
Unit Type and Rh: 5100

## 2022-10-21 LAB — BPAM FFP
Blood Product Expiration Date: 202404172359
ISSUE DATE / TIME: 202404161823
Unit Type and Rh: 6200

## 2022-10-21 LAB — MAGNESIUM: Magnesium: 2.3 mg/dL (ref 1.7–2.4)

## 2022-10-21 MED ORDER — SODIUM CHLORIDE 0.9 % IV SOLN
1.0000 g | INTRAVENOUS | Status: AC
  Administered 2022-10-21 – 2022-10-22 (×3): 1 g via INTRAVENOUS
  Filled 2022-10-21 (×3): qty 10

## 2022-10-21 MED ORDER — PANTOPRAZOLE INFUSION (NEW) - SIMPLE MED
8.0000 mg/h | INTRAVENOUS | Status: DC
  Administered 2022-10-21 – 2022-10-22 (×2): 8 mg/h via INTRAVENOUS
  Filled 2022-10-21 (×4): qty 100

## 2022-10-21 MED ORDER — CHLORHEXIDINE GLUCONATE CLOTH 2 % EX PADS
6.0000 | MEDICATED_PAD | Freq: Every day | CUTANEOUS | Status: DC
  Administered 2022-10-21 – 2022-10-22 (×2): 6 via TOPICAL

## 2022-10-21 MED ORDER — ORAL CARE MOUTH RINSE: 15.0000 mL | OROMUCOSAL | Status: DC | PRN

## 2022-10-21 MED ORDER — PANTOPRAZOLE SODIUM 40 MG IV SOLR: 40.0000 mg | Freq: Two times a day (BID) | INTRAVENOUS | Status: DC

## 2022-10-21 NOTE — Progress Notes (Signed)
Pt is stable and tolerating clears. No more episodes of bleeding noted. Will transfer out of ICU and to hospitalist service.  Chilton Greathouse MD [place] [facility] See Amion for pager  If no response to pager , please call [phone] until 7pm After 7:00 pm call Elink  [phone] 10/21/2022, 4:04 PM

## 2022-10-21 NOTE — Progress Notes (Signed)
NAME:  Timothy [name], MRN:  [id number], DOB:  1960/09/05, LOS: 1 ADMISSION DATE:  10/20/2022, CONSULTATION DATE:  10/20/2022 REFERRING MD: EDP, CHIEF COMPLAINT: GI bleed  History of Present Illness:   This is a 62 year old male with a past medical history of autoimmune hepatitis previously on azathioprine, liver cirrhosis, hypertension who presents to the emergency department with concerns of hematochezia. Patient had a 3-day history of hematochezia, and in the emergency department patient had large-volume hematemesis with bright red blood. Hemoglobin found to be low at 4.9. Emergency transfusion started, and patient admitted to ICU for further evaluation and management   Pertinent  Medical History   Autoimmune hepatitis previously on azathioprine, liver cirrhosis, hypertension   Significant Hospital Events: Including procedures, antibiotic start and stop dates in addition to other pertinent events   10/20/2022: Patient admitted to ICU for gastrointestinal hemorrhage, intubated for EGD with findings of esophageal varices which were banded.  Extubated postprocedure  Interim History / Subjective:   Awake, no distress. Had a small bloody bowel movement overnight.   Objective   Blood pressure 119/80, pulse 82, temperature 98.3 F (36.8 C), temperature source Oral, resp. rate 13, height 6' 1.5" (1.867 m), weight 97 kg, SpO2 96 %.    Vent Mode: PRVC FiO2 (%):  [60 %] 60 % Set Rate:  [15 bmp] 15 bmp Vt Set:  [640 mL] 640 mL PEEP:  [5 cmH20] 5 cmH20   Intake/Output Summary (Last 24 hours) at 10/21/2022 0844 Last data filed at 10/21/2022 0600 Gross per 24 hour  Intake 4953.45 ml  Output 1950 ml  Net 3003.45 ml   Filed Weights   10/20/22 1710 10/21/22 0500  Weight: 97.4 kg 97 kg    Examination: Blood pressure 119/80, pulse 82, temperature 98.3 F (36.8 C), temperature source Oral, resp. rate 13, height 6' 1.5" (1.867 m), weight 97 kg, SpO2 96 %. Gen:      No acute distress HEENT:  EOMI,  sclera anicteric Neck:     No masses; no thyromegaly Lungs:    Clear to auscultation bilaterally; normal respiratory effort CV:         Regular rate and rhythm; no murmurs Abd:      + bowel sounds; soft, non-tender; no palpable masses, no distension Ext:    No edema; adequate peripheral perfusion Skin:      Warm and dry; no rash Neuro: alert and oriented x 3 Psych: normal mood and affect   Labs/imaging reviewed Significant for BUN/creatinine 46/1.79 AST 54, ALT 34 WBC 4.9, hemoglobin 8.6, platelets 73   Resolved Hospital Problem list     Assessment & Plan:  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 S/p EGD Acute blood loss anemia Continue on octreotide, Protonix drips Ceftriaxone prophylaxis for 5 days Monitor CBC  Auto immune Hepatitis, liver cirrhosis Monitor LFTs  AKI Monitor BUN and creatinine   Best Practice (right click and "Reselect all SmartList Selections" daily)   Diet/type: NPO DVT prophylaxis: SCD GI prophylaxis: PPI Lines: Central line Foley:  N/A Code Status:  full code Last date of multidisciplinary goals of care discussion   Critical care time:    The patient is critically ill with multiple organ system failure and requires high complexity decision making for assessment and support, frequent evaluation and titration of therapies, advanced monitoring, review of radiographic studies and interpretation of complex data.   Critical Care Time devoted to patient care services, exclusive of separately billable procedures, described in this note is 35 minutes.   Timothy [name]  Timothy Bangs MD [facility] See Amion for pager  If no response to pager , please call [phone] until 7pm After 7:00 pm call Elink  [phone] 10/21/2022, 8:52 AM

## 2022-10-21 NOTE — Progress Notes (Signed)
Daily Progress Note  DOA: 10/20/2022 Hospital Day: 2 Chief Complaint: variceal bleed   ASSESSMENT   62 yo Timothy [name] with:    Decompensated cirrhosis ( ? 2/2 to autoimmune hepatitis) with hemodynamically unstable esophageal variceal bleed s/p EGD with banding x7.  No further GI bleeding  MELD 3.0: 11  MELD-Na: 18  INR down to 1.3 after FFP and Vit K  AKI.  Creatinine up from 1.5 on admit to 1.79 ( baseline 0.8)  *Acute blood loss anemia.  Presenting hgb 4.9. Improved to 9.2 after 2 u of blood  *Duodenal polyp, not resected during EGD yesterday   PLAN   -Continue octreotide infusion for total of 72 hours -Continue pantoprazole infusion for total of 72 hours -Will need to continue PPI upon discharge to minimize risk of post banding ulcers -Continue Rocephin for SBP prophylaxis -Initiate beta-blocker when medically stable -Will need repeat EGD in 4 to 6 weeks -[place] when stable to assess for ascites and HCC -Await AFP results -Patient lost to outpatient follow up, last seen by [place] in 2009. Will need ongoing outpatient management of cirrhosis including further evaluation of ? Autoimmune hepatitis.  -Clear liquids okay from GI standpoint  Subjective / New events:   No vomiting. Feels okay. No chest pain post banding. No further bleeding. Wants a diet   Objective     10/20/22 EGD - Large (> 5 mm) esophageal varices oozing blood, 1 with fibrin clot. Banded x 7. - Clotted blood in the gastric fundus, but no gastric varices seen. - A single duodenal polyp. Resection not attempted. No specimens collected.    Recent Labs    10/20/22 1916 10/21/22 0159 10/21/22 0809  WBC 13.6* 12.9* 12.6*  HGB 7.9* 8.6* 9.2*  HCT 23.9* 24.6* 28.8*  PLT 70* 73* 65*   BMET Recent Labs    10/20/22 1916 10/20/22 2112 10/21/22 0159  NA 142 136 141  K 5.2* 5.7* 5.0  CL 109 116* 116*  CO2 18* 16* 17*  GLUCOSE 179* 143* 128*  BUN 35* 42* 46*  CREATININE 1.56* 1.69* 1.79*  CALCIUM  6.9* 7.0* 7.4*   LFT Recent Labs    10/21/22 0159  PROT 5.3*  ALBUMIN 2.0*  AST 54*  ALT Timothy  ALKPHOS 41  BILITOT 1.2  BILIDIR 0.2  IBILI 1.0*   PT/INR Recent Labs    10/20/22 1155 10/20/22 1916  LABPROT 29.1* 16.2*  INR 2.8* 1.3*     Scheduled inpatient medications:   sodium chloride   Intravenous Once   sodium chloride   Intravenous Once   Chlorhexidine Gluconate Cloth  6 each Topical Daily   [START ON 10/24/2022] pantoprazole  40 mg Intravenous Q12H   Continuous inpatient infusions:   cefTRIAXone (ROCEPHIN)  IV     octreotide (SANDOSTATIN) 500 mcg in sodium chloride 0.9 % 250 mL (2 mcg/mL) infusion 50 mcg/hr (10/21/22 0600)   pantoprazole     PRN inpatient medications: docusate sodium, labetalol, mouth rinse, polyethylene glycol  Vital signs in last 24 hours: Temp:  [98.2 F (36.8 C)-99.6 F (37.6 C)] 98.3 F (36.8 C) (04/17 0757) Pulse Rate:  [73-113] 82 (04/17 0630) Resp:  [11-28] 13 (04/17 0630) BP: (71-180)/(50-137) 119/80 (04/17 0630) SpO2:  [94 %-100 %] 96 % (04/17 0630) FiO2 (%):  [60 %] 60 % (04/16 1655) Weight:  [97 kg-97.4 kg] 97 kg (04/17 0500) Last BM Date : 10/20/22  Intake/Output Summary (Last 24 hours) at 10/21/2022 1056 Last data filed at 10/21/2022  0600 Gross per 24 hour  Intake 4953.45 ml  Output 1950 ml  Net 3003.45 ml    Intake/Output from previous day: 04/16 0701 - 04/17 0700 In: 4953.5 [I.V.:270.7; Blood:1105; IV Piggyback:3577.8] Out: 1950 [Urine:400; Blood:350] Intake/Output this shift: No intake/output data recorded.   Physical Exam:  General: Alert Timothy [name] in NAD Heart:  Regular rate and rhythm.  Pulmonary: Normal respiratory effort Abdomen: Soft, nondistended, nontender. Normal bowel sounds. Extremities: No lower extremity edema  Neurologic: Alert and oriented Psych: Pleasant. Cooperative. Insight appears normal.    Principal Problem:   GI bleed Active Problems:   Acute upper GI bleed   Esophageal varices with  bleeding in diseases classified elsewhere   Portal hypertension with esophageal varices     LOS: 1 day   Willette Cluster ,NP 10/21/2022, 10:56 AM

## 2022-10-22 ENCOUNTER — Telehealth: Payer: Self-pay

## 2022-10-22 ENCOUNTER — Encounter (HOSPITAL_COMMUNITY): Payer: Self-pay | Admitting: Internal Medicine

## 2022-10-22 ENCOUNTER — Other Ambulatory Visit: Payer: Self-pay

## 2022-10-22 ENCOUNTER — Inpatient Hospital Stay (HOSPITAL_COMMUNITY): Payer: Medicaid Other

## 2022-10-22 DIAGNOSIS — I8511 Secondary esophageal varices with bleeding: Secondary | ICD-10-CM

## 2022-10-22 DIAGNOSIS — K766 Portal hypertension: Secondary | ICD-10-CM

## 2022-10-22 DIAGNOSIS — K7460 Unspecified cirrhosis of liver: Secondary | ICD-10-CM

## 2022-10-22 DIAGNOSIS — K922 Gastrointestinal hemorrhage, unspecified: Secondary | ICD-10-CM

## 2022-10-22 DIAGNOSIS — K920 Hematemesis: Secondary | ICD-10-CM

## 2022-10-22 DIAGNOSIS — I8500 Esophageal varices without bleeding: Secondary | ICD-10-CM

## 2022-10-22 LAB — CBC WITH DIFFERENTIAL/PLATELET
Abs Immature Granulocytes: 0.04 10*3/uL (ref 0.00–0.07)
Basophils Absolute: 0.1 10*3/uL (ref 0.0–0.1)
Basophils Relative: 1 %
Eosinophils Absolute: 0.6 10*3/uL — ABNORMAL HIGH (ref 0.0–0.5)
Eosinophils Relative: 7 %
HCT: 22.9 % — ABNORMAL LOW (ref 39.0–52.0)
Hemoglobin: 7.9 g/dL — ABNORMAL LOW (ref 13.0–17.0)
Immature Granulocytes: 0 %
Lymphocytes Relative: 33 %
Lymphs Abs: 3.1 10*3/uL (ref 0.7–4.0)
MCH: 30.2 pg (ref 26.0–34.0)
MCHC: 34.5 g/dL (ref 30.0–36.0)
MCV: 87.4 fL (ref 80.0–100.0)
Monocytes Absolute: 1 10*3/uL (ref 0.1–1.0)
Monocytes Relative: 10 %
Neutro Abs: 4.6 10*3/uL (ref 1.7–7.7)
Neutrophils Relative %: 49 %
Platelets: 59 10*3/uL — ABNORMAL LOW (ref 150–400)
RBC: 2.62 MIL/uL — ABNORMAL LOW (ref 4.22–5.81)
RDW: 16.6 % — ABNORMAL HIGH (ref 11.5–15.5)
WBC: 9.4 10*3/uL (ref 4.0–10.5)
nRBC: 0 % (ref 0.0–0.2)

## 2022-10-22 LAB — BASIC METABOLIC PANEL
Anion gap: 6 (ref 5–15)
BUN: 39 mg/dL — ABNORMAL HIGH (ref 8–23)
CO2: 19 mmol/L — ABNORMAL LOW (ref 22–32)
Calcium: 7.4 mg/dL — ABNORMAL LOW (ref 8.9–10.3)
Chloride: 114 mmol/L — ABNORMAL HIGH (ref 98–111)
Creatinine, Ser: 1.72 mg/dL — ABNORMAL HIGH (ref 0.61–1.24)
GFR, Estimated: 44 mL/min — ABNORMAL LOW (ref >60)
Glucose, Bld: 108 mg/dL — ABNORMAL HIGH (ref 70–99)
Potassium: 4 mmol/L (ref 3.5–5.1)
Sodium: 139 mmol/L (ref 135–145)

## 2022-10-22 LAB — SODIUM, URINE, RANDOM: Sodium, Ur: 43 mmol/L

## 2022-10-22 MED ORDER — SODIUM CHLORIDE 0.9 % IV SOLN: INTRAVENOUS | Status: AC

## 2022-10-22 MED ORDER — PANTOPRAZOLE SODIUM 40 MG PO TBEC
40.0000 mg | DELAYED_RELEASE_TABLET | Freq: Two times a day (BID) | ORAL | Status: DC
  Administered 2022-10-22 – 2022-10-24 (×4): 40 mg via ORAL
  Filled 2022-10-22 (×4): qty 1

## 2022-10-22 NOTE — Progress Notes (Signed)
[facility] PROGRESS NOTE    Progress Note  Timothy [name]  WJX:[id number] DOB: January 06, 1961 DOA: 10/20/2022 PCP: Storm Frisk, MD     Brief Narrative:   Timothy [name] is an 62 y.o. male past medical history of autoimmune hepatitis previously on Imuran, liver cirrhosis comes into the ED for hematochezia that started 3 days prior to admission hemoglobin in the ED was 4.9 due to gastric intestinal hemorrhage admitted to the ICU intubated EGD showed esophageal varices which were banded s/p extubation on 10/20/2022   Assessment/Plan:   Acute upper GI bleed due to esophageal varices: Started on IV Protonix, IV Rocephin (SBP prophylaxis for 5 days) and octreotide on admission. GI was consulted EGD was done that showed esophageal varices with status post banding. Repeat EGD in 4 to 6 weeks as an outpatient. Check abdominal ultrasound for ascites. Alpha-fetoprotein pending. He is status post 2 units of packed red blood cells and a unit of fresh frozen plasma's and platelets. hemoglobin this morning 7.9 check a CBC tomorrow morning. GI to dictate when stop octreotide and advance diet.  Autoimmune hepatitis/liver cirrhosis: Follow-up with GI as an outpatient started immediate passive therapy.  Acute kidney injury, With a baseline creatinine of less than 1 on admission 1.8, this morning is 1.7 will continue IV fluids for 24 hours. Check Urinary Sodium, if less than 10 high suspicious for hepatorenal syndrome.  Hyperkalemia: Resolved with IV fluids rotation.  DVT prophylaxis: SCD Family Communication:none Status is: Inpatient Remains inpatient appropriate because: Acute upper GI bleed.    Code Status:     Code Status Orders  (From admission, onward)           Start     Ordered   10/20/22 1347  Full code  Continuous       Question:  By:  Answer:  Consent: discussion documented in EHR   10/20/22 1347           Code Status History     This patient has a current code  status but no historical code status.         IV Access:   Peripheral IV   Procedures and diagnostic studies:   DG CHEST PORT 1 VIEW  Result Date: 10/20/2022 CLINICAL DATA:  Central line placement. EXAM: PORTABLE CHEST 1 VIEW COMPARISON:  Earlier today FINDINGS: Left subclavian central venous catheter tip overlies the lower SVC. No pneumothorax. Endotracheal tube tip is 4.9 cm from the carina. Heart size upper normal. Suggestion of mild vague developing retrocardiac opacity. No large pleural effusion. No pulmonary edema. IMPRESSION: 1. Left subclavian central venous catheter tip overlies the lower SVC. No pneumothorax. 2. Endotracheal tube tip 4.9 cm from the carina. 3. Suggestion of vague developing retrocardiac opacity. Electronically Signed   By: Narda Rutherford M.D.   On: 10/20/2022 17:51   [place] EKG SITE RITE  Result Date: 10/20/2022 If Site Rite image not attached, placement could not be confirmed due to current cardiac rhythm.  DG Chest Port 1 View  Result Date: 10/20/2022 CLINICAL DATA:  Syncope EXAM: PORTABLE CHEST 1 VIEW COMPARISON:  Chest radiograph 02/07/2008 FINDINGS: The heart is borderline enlarged. The upper mediastinal contours are normal There is no focal consolidation or pulmonary edema. There is no pleural effusion or pneumothorax There is no acute osseous abnormality. IMPRESSION: No radiographic evidence of acute cardiopulmonary process. Electronically Signed   By: Lesia Hausen M.D.   On: 10/20/2022 12:18     Medical Consultants:   None.  Subjective:    Timothy [name] no complaints.  Passing black stools.  Objective:    Vitals:   10/21/22 1700 10/21/22 2003 10/21/22 2300 10/22/22 0528  BP: (!) 128/95  119/84 121/78  Pulse: 79   76  Resp: (!) 22  20 20   Temp:  98.8 F (37.1 C) 98.7 F (37.1 C) 98.4 F (36.9 C)  TempSrc:  Oral Oral Oral  SpO2: 99%   97%  Weight:   100.1 kg 99.3 kg  Height:       SpO2: 97 % O2 Flow Rate (L/min): 1 L/min FiO2 (%):  60 %   Intake/Output Summary (Last 24 hours) at 10/22/2022 0728 Last data filed at 10/22/2022 0537 Gross per 24 hour  Intake 739.8 ml  Output 2750 ml  Net -2010.2 ml   Filed Weights   10/21/22 0500 10/21/22 2300 10/22/22 0528  Weight: 97 kg 100.1 kg 99.3 kg    Exam: General exam: In no acute distress. Respiratory system: Good air movement and clear to auscultation. Cardiovascular system: S1 & S2 heard, RRR. No JVD.  Gastrointestinal system: Abdomen is nondistended, soft and nontender.  Extremities: No pedal edema. Skin: No rashes, lesions or ulcers Psychiatry: Judgement and insight appear normal. Mood & affect appropriate.    Data Reviewed:    Labs: Basic Metabolic Panel: Recent Labs  Lab 10/20/22 1634 10/20/22 1916 10/20/22 2112 10/21/22 0159 10/22/22 0215  NA 138 142 136 141 139  K 5.5* 5.2* 5.7* 5.0 4.0  CL 118* 109 116* 116* 114*  CO2 16* 18* 16* 17* 19*  GLUCOSE 126* 179* 143* 128* 108*  BUN 40* 35* 42* 46* 39*  CREATININE 1.59* 1.56* 1.69* 1.79* 1.72*  CALCIUM 7.2* 6.9* 7.0* 7.4* 7.4*  MG 1.7 1.6*  --  2.3  --    GFR Estimated Creatinine Clearance: 55.7 mL/min (A) (by C-G formula based on SCr of 1.72 mg/dL (H)). Liver Function Tests: Recent Labs  Lab 10/20/22 1155 10/21/22 0159  AST 29 54*  ALT 17 34  ALKPHOS 21* 41  BILITOT 0.8 1.2  PROT <3.0* 5.3*  ALBUMIN <1.5* 2.0*   Recent Labs  Lab 10/20/22 1155  LIPASE 37   No results for input(s): "AMMONIA" in the last 168 hours. Coagulation profile Recent Labs  Lab 10/20/22 1155 10/20/22 1916  INR 2.8* 1.3*   COVID-19 Labs  No results for input(s): "DDIMER", "FERRITIN", "LDH", "CRP" in the last 72 hours.  Lab Results  Component Value Date   SARSCOV2NAA POSITIVE (A) 09/26/2021   SARSCOV2NAA Not Detected 07/04/2020    CBC: Recent Labs  Lab 10/20/22 1916 10/21/22 0159 10/21/22 0809 10/21/22 1930 10/22/22 0200  WBC 13.6* 12.9* 12.6* 10.1 9.4  NEUTROABS 10.5* 7.8* 7.0 5.3 4.6  HGB  7.9* 8.6* 9.2* 8.2* 7.9*  HCT 23.9* 24.6* 28.8* 24.2* 22.9*  MCV 88.2 86.0 91.1 87.7 87.4  PLT 70* 73* 65* 64* 59*   Cardiac Enzymes: No results for input(s): "CKTOTAL", "CKMB", "CKMBINDEX", "TROPONINI" in the last 168 hours. BNP (last 3 results) No results for input(s): "PROBNP" in the last 8760 hours. CBG: Recent Labs  Lab 10/20/22 1455 10/20/22 2003  GLUCAP 96 131*   D-Dimer: No results for input(s): "DDIMER" in the last 72 hours. Hgb A1c: No results for input(s): "HGBA1C" in the last 72 hours. Lipid Profile: No results for input(s): "CHOL", "HDL", "LDLCALC", "TRIG", "CHOLHDL", "LDLDIRECT" in the last 72 hours. Thyroid function studies: No results for input(s): "TSH", "T4TOTAL", "T3FREE", "THYROIDAB" in the last 72 hours.  Invalid input(s): "FREET3" Anemia work up: No results for input(s): "VITAMINB12", "FOLATE", "FERRITIN", "TIBC", "IRON", "RETICCTPCT" in the last 72 hours. Sepsis Labs: Recent Labs  Lab 10/21/22 0159 10/21/22 0809 10/21/22 1930 10/22/22 0200  WBC 12.9* 12.6* 10.1 9.4   Microbiology Recent Results (from the past 240 hour(s))  MRSA Next Gen by PCR, Nasal     Status: None   Collection Time: 10/20/22  3:13 PM   Specimen: Nasal Mucosa; Nasal Swab  Result Value Ref Range Status   MRSA by PCR Next Gen NOT DETECTED NOT DETECTED Final    Comment: (NOTE) The GeneXpert MRSA Assay (FDA approved for NASAL specimens only), is one component of a comprehensive MRSA colonization surveillance program. It is not intended to diagnose MRSA infection nor to guide or monitor treatment for MRSA infections. Test performance is not FDA approved in patients less than 71 years old. Performed at [facility], [address] [place], [place] [address]      Medications:    sodium chloride   Intravenous Once   sodium chloride   Intravenous Once   Chlorhexidine Gluconate Cloth  6 each Topical Daily   [START ON 10/24/2022] pantoprazole  40 mg Intravenous Q12H    Continuous Infusions:  cefTRIAXone (ROCEPHIN)  IV 1 g (10/21/22 1820)   octreotide (SANDOSTATIN) 500 mcg in sodium chloride 0.9 % 250 mL (2 mcg/mL) infusion 50 mcg/hr (10/21/22 1500)   pantoprazole 8 mg/hr (10/22/22 0706)      LOS: 2 days   Marinda Elk  [facility]  10/22/2022, 7:28 AM

## 2022-10-22 NOTE — Telephone Encounter (Signed)
Per Doug Sou PA V.O pt has an appt with Willette Cluster NP on 12/21/22 at 1130 am.  He has also been scheduled for EGD at [facility] on 11/11/22 at 9 am with Dr Rhea Belton.  All instructions have been sent to My Chart and mailed to the pt. He will also be given a copy at discharge.

## 2022-10-22 NOTE — Progress Notes (Signed)
[facility] Progress Note  CC:  Variceal bleed  Subjective:  Feels well.  Wants to eat.  No sign of bleeding, stools darkish but not black per se.  Objective:  Vital signs in last 24 hours: Temp:  [98.4 F (36.9 C)-98.8 F (37.1 C)] 98.6 F (37 C) (04/18 1155) Pulse Rate:  [70-84] 71 (04/18 1155) Resp:  [12-22] 19 (04/18 1155) BP: (116-128)/(73-98) 123/73 (04/18 1155) SpO2:  [96 %-99 %] 96 % (04/18 1155) Weight:  [99.3 kg-100.1 kg] 99.3 kg (04/18 0528) Last BM Date : 10/21/22 General:  Alert, Well-developed, in NAD Heart:  Regular rate and rhythm; no murmurs Pulm:  CTAB.  No W/R/R. Abdomen:  Soft, non-distended.  BS present.  Non-tender. Extremities:  Without edema. Neurologic:  Alert and oriented x 4;  grossly normal neurologically. Psych:  Alert and cooperative. Normal mood and affect.  Intake/Output from previous day: 04/17 0701 - 04/18 0700 In: 739.8 [P.O.:360; I.V.:279.8; IV Piggyback:100] Out: 2750 [Urine:2750] Intake/Output this shift: Total I/O In: -  Out: 150 [Urine:150]  Lab Results: Recent Labs    10/21/22 0809 10/21/22 1930 10/22/22 0200  WBC 12.6* 10.1 9.4  HGB 9.2* 8.2* 7.9*  HCT 28.8* 24.2* 22.9*  PLT 65* 64* 59*   BMET Recent Labs    10/20/22 2112 10/21/22 0159 10/22/22 0215  NA 136 141 139  K 5.7* 5.0 4.0  CL 116* 116* 114*  CO2 16* 17* 19*  GLUCOSE 143* 128* 108*  BUN 42* 46* 39*  CREATININE 1.69* 1.79* 1.72*  CALCIUM 7.0* 7.4* 7.4*   LFT Recent Labs    10/21/22 0159  PROT 5.3*  ALBUMIN 2.0*  AST 54*  ALT 34  ALKPHOS 41  BILITOT 1.2  BILIDIR 0.2  IBILI 1.0*   PT/INR Recent Labs    10/20/22 1155 10/20/22 1916  LABPROT 29.1* 16.2*  INR 2.8* 1.3*   DG CHEST PORT 1 VIEW  Result Date: 10/20/2022 CLINICAL DATA:  Central line placement. EXAM: PORTABLE CHEST 1 VIEW COMPARISON:  Earlier today FINDINGS: Left subclavian central venous catheter tip overlies the lower SVC. No pneumothorax. Endotracheal tube  tip is 4.9 cm from the carina. Heart size upper normal. Suggestion of mild vague developing retrocardiac opacity. No large pleural effusion. No pulmonary edema. IMPRESSION: 1. Left subclavian central venous catheter tip overlies the lower SVC. No pneumothorax. 2. Endotracheal tube tip 4.9 cm from the carina. 3. Suggestion of vague developing retrocardiac opacity. Electronically Signed   By: Narda Rutherford M.D.   On: 10/20/2022 17:51   [place] EKG SITE RITE  Result Date: 10/20/2022 If Site Rite image not attached, placement could not be confirmed due to current cardiac rhythm.   Assessment / Plan: Decompensated cirrhosis ( ? 2/2 to autoimmune hepatitis) with hemodynamically unstable esophageal variceal bleed s/p EGD with banding x7 on 4/16. No further GI bleeding.   INR down to 1.3 after FFP and Vit K   AKI.  Creatinine up from 1.5 on admit to 1.72 today ( baseline 0.8).  Urine sodium is high so doubt HRS.   *Acute blood loss anemia.  Presenting hgb 4.9. Improved to 9.2 after 2 u of PRBCs but down-trended to 7.9 gram this AM, possibly equilibration with no evidence of ongoing bleeding.   *Duodenal polyp, not resected during EGD yesterday  -Continue octreotide infusion for total of 72 hours -Continue pantoprazole infusion for total of 72 hours -Will need to continue BID PPI upon discharge to minimize risk of post banding ulcers. -Continue  Rocephin for SBP prophylaxis for now. -Await results of abdominal ultrasound to see if he has ascites and for HCC screening. -Initiate beta-blocker when medically stable. -Will need repeat EGD in 4 to 6 weeks.  This is scheduled for 5/8 at [facility] with Dr. Rhea Belton. -Await AFP results as it is still pending. -Patient lost to outpatient follow up, last seen by [place] in 2009. Will need ongoing outpatient management of cirrhosis including further evaluation of ? Autoimmune hepatitis.  We need to sort out if there is a component of autoimmune hepatitis or not as  this might warrant additional treatment. Will be done at outpatient follow-up that has been scheduled for 6/17 at 1130AM.  -Will advance to soft diet.    LOS: 2 days   Princella Pellegrini. Makila Colombe  10/22/2022, 1:34 PM

## 2022-10-23 DIAGNOSIS — K254 Chronic or unspecified gastric ulcer with hemorrhage: Secondary | ICD-10-CM

## 2022-10-23 LAB — AFP TUMOR MARKER
AFP, Serum, Tumor Marker: 2.3 ng/mL (ref 0.0–8.4)
AFP, Serum, Tumor Marker: <1.8 ng/mL (ref 0.0–8.4)

## 2022-10-23 LAB — COMPREHENSIVE METABOLIC PANEL
ALT: 17 U/L (ref 0–44)
AST: 29 U/L (ref 15–41)
Albumin: <1.5 g/dL — ABNORMAL LOW (ref 3.5–5.0)
CO2: 10 mmol/L — ABNORMAL LOW (ref 22–32)
Calcium: 4.3 mg/dL — CL (ref 8.9–10.3)
Chloride: 129 mmol/L — ABNORMAL HIGH (ref 98–111)
Creatinine, Ser: 0.86 mg/dL (ref 0.61–1.24)
GFR, Estimated: >60 mL/min (ref >60)
Potassium: 2.3 mmol/L — CL (ref 3.5–5.1)
Sodium: 144 mmol/L (ref 135–145)
Total Bilirubin: 0.8 mg/dL (ref 0.3–1.2)

## 2022-10-23 LAB — BASIC METABOLIC PANEL
Anion gap: 7 (ref 5–15)
BUN: 30 mg/dL — ABNORMAL HIGH (ref 8–23)
CO2: 18 mmol/L — ABNORMAL LOW (ref 22–32)
Calcium: 7.2 mg/dL — ABNORMAL LOW (ref 8.9–10.3)
Chloride: 111 mmol/L (ref 98–111)
Creatinine, Ser: 1.58 mg/dL — ABNORMAL HIGH (ref 0.61–1.24)
GFR, Estimated: 49 mL/min — ABNORMAL LOW (ref >60)
Glucose, Bld: 86 mg/dL (ref 70–99)
Potassium: 3.7 mmol/L (ref 3.5–5.1)
Sodium: 136 mmol/L (ref 135–145)

## 2022-10-23 LAB — CBC
HCT: 21.5 % — ABNORMAL LOW (ref 39.0–52.0)
Hemoglobin: 7.4 g/dL — ABNORMAL LOW (ref 13.0–17.0)
MCH: 30.2 pg (ref 26.0–34.0)
MCHC: 34.4 g/dL (ref 30.0–36.0)
MCV: 87.8 fL (ref 80.0–100.0)
Platelets: 54 10*3/uL — ABNORMAL LOW (ref 150–400)
RBC: 2.45 MIL/uL — ABNORMAL LOW (ref 4.22–5.81)
RDW: 16.3 % — ABNORMAL HIGH (ref 11.5–15.5)
WBC: 7.1 10*3/uL (ref 4.0–10.5)
nRBC: 0.3 % — ABNORMAL HIGH (ref 0.0–0.2)

## 2022-10-23 MED ORDER — SODIUM CHLORIDE 0.9 % IV SOLN
1.0000 g | Freq: Once | INTRAVENOUS | Status: AC
  Administered 2022-10-23: 1 g via INTRAVENOUS
  Filled 2022-10-23: qty 10

## 2022-10-23 MED ORDER — CARVEDILOL 3.125 MG PO TABS
3.1250 mg | ORAL_TABLET | Freq: Two times a day (BID) | ORAL | Status: DC
  Administered 2022-10-23 – 2022-10-24 (×2): 3.125 mg via ORAL
  Filled 2022-10-23 (×2): qty 1

## 2022-10-23 NOTE — Progress Notes (Signed)
[facility] Progress Note  CC:  Variceal bleed  Subjective:  Feels well.  Would like to go home.  No BM today.  Tolerating soft diet with evidence of bleeding.  Objective:  Vital signs in last 24 hours: Temp:  [98.6 F (37 C)-99.1 F (37.3 C)] 98.7 F (37.1 C) (04/19 0427) Pulse Rate:  [71-73] 73 (04/18 2349) Resp:  [17-19] 18 (04/19 0427) BP: (111-123)/(54-73) 111/71 (04/19 0427) SpO2:  [95 %-96 %] 95 % (04/19 0427) Weight:  [102 kg] 102 kg (04/19 0427) Last BM Date : 10/22/22 (no blood/darkness per patient) General:  Alert, Well-developed, in NAD Heart:  Regular rate and rhythm; no murmurs Pulm:  CTAB.  No W/R/R. Abdomen:  Soft, non-distended.  BS present.  Non-tender. Extremities:  Without edema. Neurologic:  Alert and oriented x 4;  grossly normal neurologically. Psych:  Alert and cooperative. Normal mood and affect.  Intake/Output from previous day: 04/18 0701 - 04/19 0700 In: 1015 [P.O.:1015] Out: 700 [Urine:700]  Lab Results: Recent Labs    10/21/22 1930 10/22/22 0200 10/23/22 0113  WBC 10.1 9.4 7.1  HGB 8.2* 7.9* 7.4*  HCT 24.2* 22.9* 21.5*  PLT 64* 59* 54*   BMET Recent Labs    10/21/22 0159 10/22/22 0215 10/23/22 0113  NA 141 139 136  K 5.0 4.0 3.7  CL 116* 114* 111  CO2 17* 19* 18*  GLUCOSE 128* 108* 86  BUN 46* 39* 30*  CREATININE 1.79* 1.72* 1.58*  CALCIUM 7.4* 7.4* 7.2*   LFT Recent Labs    10/21/22 0159  PROT 5.3*  ALBUMIN 2.0*  AST 54*  ALT 34  ALKPHOS 41  BILITOT 1.2  BILIDIR 0.2  IBILI 1.0*   PT/INR Recent Labs    10/20/22 1155 10/20/22 1916  LABPROT 29.1* 16.2*  INR 2.8* 1.3*   US Abdomen Complete  Result Date: 10/22/2022 CLINICAL DATA:  Cirrhosis EXAM: ABDOMEN ULTRASOUND COMPLETE COMPARISON:  Ultrasound January 2019. FINDINGS: Gallbladder: Distended gallbladder with shadowing stones. Mild sludge as well. Common bile duct: Common duct not well seen. Obscured by overlapping bowel gas and soft  tissue. Liver: Nodular heterogeneous liver consistent with chronic liver disease. With this level of heterogeneity evaluation for underlying mass lesion is limited and if needed follow-up contrast CT or MRI as clinically appropriate portal vein is patent on color Doppler imaging with normal direction of blood flow towards the liver. IVC: No abnormality visualized. Pancreas: Obscured by overlapping bowel gas and soft tissue. Spleen: Mildly enlarged at 13.2 cm in length. Right Kidney: Length: 6.9. No collecting system dilatation but partially obscured by overlapping bowel gas. Left Kidney: Length: 10.2. Echogenicity within normal limits. No mass or hydronephrosis visualized. Abdominal aorta: Partially obscured by overlapping bowel gas and soft tissue. Visualized portions are nondilated. Other findings: Scattered ascites IMPRESSION: Nodular heterogeneous liver with mild splenomegaly and some ascites. Distended gallbladder with stones. No intrahepatic biliary ductal dilatation. The common duct is obscured. There are several other structures partially obscured by overlapping bowel gas and soft tissue as well including in the right kidney, aorta and pancreas. Electronically Signed   By: Karen Kays M.D.   On: 10/22/2022 16:57    Assessment / Plan: Decompensated cirrhosis ( ? 2/2 to autoimmune hepatitis) with hemodynamically unstable esophageal variceal bleed s/p EGD with banding x7 on 4/16. No further GI bleeding.   INR down to 1.3 after FFP and Vit K   AKI.  Creatinine up from 1.58 (baseline 0.8).  Urine sodium is high  so doubt HRS.   *Acute blood loss anemia.  Presenting hgb 4.9. Improved to 9.2 after 2 u of PRBCs but down-trended to 7.9 grams yesterday then 7.4 grams this AM, possibly equilibration with no evidence of ongoing bleeding.   *Duodenal polyp, not resected during EGD   -Continue octreotide infusion for total of 72 hours.  Should complete today. -Continue pantoprazole 40 mg BID. -Continue  Rocephin for SBP prophylaxis for now, will need 5 days total of abx since he does have ascites on ultrasound. -Initiate beta-blocker when able, start at nadolol 20 mg today if able. -Will need repeat EGD in 4 to 6 weeks.  This is scheduled for 5/8 at [facility] hospital with Dr. Rhea Belton. -Patient lost to outpatient follow up, last seen by [place] in 2009. Will need ongoing outpatient management of cirrhosis including further evaluation of ? Autoimmune hepatitis.  We need to sort out if there is a component of autoimmune hepatitis or not as this might warrant additional treatment. Will be done at outpatient follow-up that has been scheduled for 6/17 at 1130 AM.  -Possible discharge later today vs tomorrow AM.   LOS: 3 days   Timothy [name]. Timothy [name]  10/23/2022, 10:24 AM

## 2022-10-23 NOTE — Progress Notes (Signed)
[facility] PROGRESS NOTE    Progress Note  Arshad Oberholzer  ZOX:[id number] DOB: 04-Oct-1960 DOA: 10/20/2022 PCP: Storm Frisk, MD     Brief Narrative:   Timothy [name] is an 62 y.o. male past medical history of autoimmune hepatitis previously on Imuran, liver cirrhosis comes into the ED for hematochezia that started 3 days prior to admission hemoglobin in the ED was 4.9 due to gastric intestinal hemorrhage admitted to the ICU intubated EGD showed esophageal varices which were banded s/p extubation on 10/20/2022  Patient seen alongside patient's wife.  Patient is improving.  GI team is appreciated.  Likely discharge tomorrow.  Improvement in AKI is noted.   Assessment/Plan:   Acute upper GI bleed due to esophageal varices: Started on IV Protonix, IV Rocephin (SBP prophylaxis for 5 days) and octreotide on admission. GI was consulted EGD was done that showed esophageal varices with status post banding. Repeat EGD in 4 to 6 weeks as an outpatient. Check abdominal ultrasound for ascites. Alpha-fetoprotein pending. He is status post 2 units of packed red blood cells and a unit of fresh frozen plasma's and platelets. hemoglobin this morning 7.9 check a CBC tomorrow morning. GI to dictate when stop octreotide and advance diet. 10/23/2022: Hemoglobin is 7.4 g/dL today.  Autoimmune hepatitis/liver cirrhosis: Follow-up with GI as an outpatient started immediate passive therapy.  Acute kidney injury, With a baseline creatinine of less than 1 on admission 1.8, this morning is 1.7 will continue IV fluids for 24 hours. Check Urinary Sodium, if less than 10 high suspicious for hepatorenal syndrome. 10/23/2022: AKI is improving.  Continue to follow renal function and electrolytes.  Hyperkalemia: Resolved with IV fluids rotation. 10/23/2022: Potassium is 3.7 today.  DVT prophylaxis: SCD Family Communication:none Status is: Inpatient Remains inpatient appropriate because: Acute upper GI  bleed.    Code Status:     Code Status Orders  (From admission, onward)           Start     Ordered   10/20/22 1347  Full code  Continuous       Question:  By:  Answer:  Consent: discussion documented in EHR   10/20/22 1347           Code Status History     This patient has a current code status but no historical code status.         IV Access:   Peripheral IV   Procedures and diagnostic studies:   US Abdomen Complete  Result Date: 10/22/2022 CLINICAL DATA:  Cirrhosis EXAM: ABDOMEN ULTRASOUND COMPLETE COMPARISON:  Ultrasound January 2019. FINDINGS: Gallbladder: Distended gallbladder with shadowing stones. Mild sludge as well. Common bile duct: Common duct not well seen. Obscured by overlapping bowel gas and soft tissue. Liver: Nodular heterogeneous liver consistent with chronic liver disease. With this level of heterogeneity evaluation for underlying mass lesion is limited and if needed follow-up contrast CT or MRI as clinically appropriate portal vein is patent on color Doppler imaging with normal direction of blood flow towards the liver. IVC: No abnormality visualized. Pancreas: Obscured by overlapping bowel gas and soft tissue. Spleen: Mildly enlarged at 13.2 cm in length. Right Kidney: Length: 6.9. No collecting system dilatation but partially obscured by overlapping bowel gas. Left Kidney: Length: 10.2. Echogenicity within normal limits. No mass or hydronephrosis visualized. Abdominal aorta: Partially obscured by overlapping bowel gas and soft tissue. Visualized portions are nondilated. Other findings: Scattered ascites IMPRESSION: Nodular heterogeneous liver with mild splenomegaly and some ascites. Distended gallbladder  with stones. No intrahepatic biliary ductal dilatation. The common duct is obscured. There are several other structures partially obscured by overlapping bowel gas and soft tissue as well including in the right kidney, aorta and pancreas.  Electronically Signed   By: Karen Kays M.D.   On: 10/22/2022 16:57     Medical Consultants:   None.   Subjective:    [name] no complaints.  Passing black stools.  Objective:    Vitals:   10/22/22 2349 10/23/22 0427 10/23/22 0834 10/23/22 1400  BP: 121/68 111/71 121/80 120/88  Pulse: 73  66   Resp: Temp: 98.7 F (37.1 C) 98.7 F (37.1 C) 99.1 F (37.3 C) 98.4 F (36.9 C)  TempSrc: Oral Oral  Oral  SpO2: 95% 95% 96% 95%  Weight:  102 kg    Height:       SpO2: 95 % O2 Flow Rate (L/min): 1 L/min FiO2 (%): 60 %   Intake/Output Summary (Last 24 hours) at 10/23/2022 1735 Last data filed at 10/23/2022 0434 Gross per 24 hour  Intake 838 ml  Output 550 ml  Net 288 ml    Filed Weights   10/21/22 2300 10/22/22 0528 10/23/22 0427  Weight: 100.1 kg 99.3 kg 102 kg    Exam: General exam: In no acute distress.  Mild volume overload is noted. Respiratory system: Clear to auscultation. Cardiovascular system: S1 & S2 heard.  Gastrointestinal system: Abdomen is soft and nontender. Extremities: Edema. Neuro: Awake and alert.  Moves all extremities.   Data Reviewed:    Labs: Basic Metabolic Panel: Recent Labs  Lab 10/20/22 1634 10/20/22 1916 10/20/22 2112 10/21/22 0159 10/22/22 0215 10/23/22 0113  NA 138 142 136 141 139 136  K 5.5* 5.2* 5.7* 5.0 4.0 3.7  CL 118* 109 116* 116* 114* 111  CO2 16* 18* 16* 17* 19* 18*  GLUCOSE 126* 179* 143* 128* 108* 86  BUN 40* 35* 42* 46* 39* 30*  CREATININE 1.59* 1.56* 1.69* 1.79* 1.72* 1.58*  CALCIUM 7.2* 6.9* 7.0* 7.4* 7.4* 7.2*  MG 1.7 1.6*  --  2.3  --   --     GFR Estimated Creatinine Clearance: 61.4 mL/min (A) (by C-G formula based on SCr of 1.58 mg/dL (H)). Liver Function Tests: Recent Labs  Lab 10/20/22 1155 10/21/22 0159  AST 29 54*  ALT 17 34  ALKPHOS 21* 41  BILITOT 0.8 1.2  PROT <3.0* 5.3*  ALBUMIN <1.5* 2.0*    Recent Labs  Lab 10/20/22 1155  LIPASE 37    No results for  input(s): "AMMONIA" in the last 168 hours. Coagulation profile Recent Labs  Lab 10/20/22 1155 10/20/22 1916  INR 2.8* 1.3*    COVID-19 Labs  No results for input(s): "DDIMER", "FERRITIN", "LDH", "CRP" in the last 72 hours.  Lab Results  Component Value Date   SARSCOV2NAA POSITIVE (A) 09/26/2021   SARSCOV2NAA Not Detected 07/04/2020    CBC: Recent Labs  Lab 10/20/22 1916 10/21/22 0159 10/21/22 0809 10/21/22 1930 10/22/22 0200 10/23/22 0113  WBC 13.6* 12.9* 12.6* 10.1 9.4 7.1  NEUTROABS 10.5* 7.8* 7.0 5.3 4.6  --   HGB 7.9* 8.6* 9.2* 8.2* 7.9* 7.4*  HCT 23.9* 24.6* 28.8* 24.2* 22.9* 21.5*  MCV 88.2 86.0 91.1 87.7 87.4 87.8  PLT 70* 73* 65* 64* 59* 54*    Cardiac Enzymes: No results for input(s): "CKTOTAL", "CKMB", "CKMBINDEX", "TROPONINI" in the last 168 hours. BNP (last 3 results) No results for input(s): "PROBNP" in  the last 8760 hours. CBG: Recent Labs  Lab 10/20/22 1455 10/20/22 2003  GLUCAP 96 131*    D-Dimer: No results for input(s): "DDIMER" in the last 72 hours. Hgb A1c: No results for input(s): "HGBA1C" in the last 72 hours. Lipid Profile: No results for input(s): "CHOL", "HDL", "LDLCALC", "TRIG", "CHOLHDL", "LDLDIRECT" in the last 72 hours. Thyroid function studies: No results for input(s): "TSH", "T4TOTAL", "T3FREE", "THYROIDAB" in the last 72 hours.  Invalid input(s): "FREET3" Anemia work up: No results for input(s): "VITAMINB12", "FOLATE", "FERRITIN", "TIBC", "IRON", "RETICCTPCT" in the last 72 hours. Sepsis Labs: Recent Labs  Lab 10/21/22 0809 10/21/22 1930 10/22/22 0200 10/23/22 0113  WBC 12.6* 10.1 9.4 7.1    Microbiology Recent Results (from the past 240 hour(s))  MRSA Next Gen by PCR, Nasal     Status: None   Collection Time: 10/20/22  3:13 PM   Specimen: Nasal Mucosa; Nasal Swab  Result Value Ref Range Status   MRSA by PCR Next Gen NOT DETECTED NOT DETECTED Final    Comment: (NOTE) The GeneXpert MRSA Assay (FDA approved  for NASAL specimens only), is one component of a comprehensive MRSA colonization surveillance program. It is not intended to diagnose MRSA infection nor to guide or monitor treatment for MRSA infections. Test performance is not FDA approved in patients less than 57 years old. Performed at [facility], [address] [place], [place] [address]      Medications:    sodium chloride   Intravenous Once   sodium chloride   Intravenous Once   carvedilol  3.125 mg Oral BID WC   Chlorhexidine Gluconate Cloth  6 each Topical Daily   pantoprazole  40 mg Oral BID AC   Continuous Infusions:   Time spent: 35 minutes   LOS: 3 days   Barnetta Chapel  [facility]  10/23/2022, 5:35 PM

## 2022-10-23 NOTE — TOC Progression Note (Signed)
Transition of Care [facility]) - Progression Note    Patient Details  Name: Timothy [name] MRN: [id number] Date of Birth: 04-Aug-1960  Transition [facility] Care [facility]) CM/SW Contact  Leone Haven, RN Phone Number: 10/23/2022, 4:14 PM  Clinical Narrative:    From home w/wife. GIB wsophageal varices, has follow up with [facility]. TOC following.        Expected Discharge Plan and Services                                               Social Determinants of Health (SDOH) Interventions SDOH Screenings   Depression (PHQ2-9): Low Risk  (04/29/2022)  Tobacco Use: Low Risk  (10/22/2022)    Readmission Risk Interventions     No data to display

## 2022-10-23 NOTE — H&P (View-Only) (Signed)
   [facility] Progress Note  CC:  Variceal bleed  Subjective:  Feels well.  Would like to go home.  No BM today.  Tolerating soft diet with evidence of bleeding.  Objective:  Vital signs in last 24 hours: Temp:  [98.6 F (37 C)-99.1 F (37.3 C)] 98.7 F (37.1 C) (04/19 0427) Pulse Rate:  [71-73] 73 (04/18 2349) Resp:  [17-19] 18 (04/19 0427) BP: (111-123)/(54-73) 111/71 (04/19 0427) SpO2:  [95 %-96 %] 95 % (04/19 0427) Weight:  [102 kg] 102 kg (04/19 0427) Last BM Date : 10/22/22 (no blood/darkness per patient) General:  Alert, Well-developed, in NAD Heart:  Regular rate and rhythm; no murmurs Pulm:  CTAB.  No W/R/R. Abdomen:  Soft, non-distended.  BS present.  Non-tender. Extremities:  Without edema. Neurologic:  Alert and oriented x 4;  grossly normal neurologically. Psych:  Alert and cooperative. Normal mood and affect.  Intake/Output from previous day: 04/18 0701 - 04/19 0700 In: 1015 [P.O.:1015] Out: 700 [Urine:700]  Lab Results: Recent Labs    10/21/22 1930 10/22/22 0200 10/23/22 0113  WBC 10.1 9.4 7.1  HGB 8.2* 7.9* 7.4*  HCT 24.2* 22.9* 21.5*  PLT 64* 59* 54*   BMET Recent Labs    10/21/22 0159 10/22/22 0215 10/23/22 0113  NA 141 139 136  K 5.0 4.0 3.7  CL 116* 114* 111  CO2 17* 19* 18*  GLUCOSE 128* 108* 86  BUN 46* 39* 30*  CREATININE 1.79* 1.72* 1.58*  CALCIUM 7.4* 7.4* 7.2*   LFT Recent Labs    10/21/22 0159  PROT 5.3*  ALBUMIN 2.0*  AST 54*  ALT 34  ALKPHOS 41  BILITOT 1.2  BILIDIR 0.2  IBILI 1.0*   PT/INR Recent Labs    10/20/22 1155 10/20/22 1916  LABPROT 29.1* 16.2*  INR 2.8* 1.3*   US Abdomen Complete  Result Date: 10/22/2022 CLINICAL DATA:  Cirrhosis EXAM: ABDOMEN ULTRASOUND COMPLETE COMPARISON:  Ultrasound January 2019. FINDINGS: Gallbladder: Distended gallbladder with shadowing stones. Mild sludge as well. Common bile duct: Common duct not well seen. Obscured by overlapping bowel gas and soft  tissue. Liver: Nodular heterogeneous liver consistent with chronic liver disease. With this level of heterogeneity evaluation for underlying mass lesion is limited and if needed follow-up contrast CT or MRI as clinically appropriate portal vein is patent on color Doppler imaging with normal direction of blood flow towards the liver. IVC: No abnormality visualized. Pancreas: Obscured by overlapping bowel gas and soft tissue. Spleen: Mildly enlarged at 13.2 cm in length. Right Kidney: Length: 6.9. No collecting system dilatation but partially obscured by overlapping bowel gas. Left Kidney: Length: 10.2. Echogenicity within normal limits. No mass or hydronephrosis visualized. Abdominal aorta: Partially obscured by overlapping bowel gas and soft tissue. Visualized portions are nondilated. Other findings: Scattered ascites IMPRESSION: Nodular heterogeneous liver with mild splenomegaly and some ascites. Distended gallbladder with stones. No intrahepatic biliary ductal dilatation. The common duct is obscured. There are several other structures partially obscured by overlapping bowel gas and soft tissue as well including in the right kidney, aorta and pancreas. Electronically Signed   By: Ashok  Gupta M.D.   On: 10/22/2022 16:57    Assessment / Plan: Decompensated cirrhosis ( ? 2/2 to autoimmune hepatitis) with hemodynamically unstable esophageal variceal bleed s/p EGD with banding x7 on 4/16. No further GI bleeding.   INR down to 1.3 after FFP and Vit K   AKI.  Creatinine up from 1.58 (baseline 0.8).  Urine sodium is high   so doubt HRS.   *Acute blood loss anemia.  Presenting hgb 4.9. Improved to 9.2 after 2 u of PRBCs but down-trended to 7.9 grams yesterday then 7.4 grams this AM, possibly equilibration with no evidence of ongoing bleeding.   *Duodenal polyp, not resected during EGD   -Continue octreotide infusion for total of 72 hours.  Should complete today. -Continue pantoprazole 40 mg BID. -Continue  Rocephin for SBP prophylaxis for now, will need 5 days total of abx since he does have ascites on ultrasound. -Initiate beta-blocker when able, start at nadolol 20 mg today if able. -Will need repeat EGD in 4 to 6 weeks.  This is scheduled for 5/8 at [facility] with Dr. Pyrtle. -Patient lost to outpatient follow up, last seen by us in 2009. Will need ongoing outpatient management of cirrhosis including further evaluation of ? Autoimmune hepatitis.  We need to sort out if there is a component of autoimmune hepatitis or not as this might warrant additional treatment. Will be done at outpatient follow-up that has been scheduled for 6/17 at 1130 AM.  -Possible discharge later today vs tomorrow AM.   LOS: 3 days   Roshawn Ayala D. Duval Macleod  10/23/2022, 10:24 AM    

## 2022-10-23 NOTE — Plan of Care (Signed)

## 2022-10-24 ENCOUNTER — Other Ambulatory Visit (HOSPITAL_COMMUNITY): Payer: Self-pay

## 2022-10-24 LAB — BASIC METABOLIC PANEL
Anion gap: 3 — ABNORMAL LOW (ref 5–15)
BUN: 25 mg/dL — ABNORMAL HIGH (ref 8–23)
CO2: 19 mmol/L — ABNORMAL LOW (ref 22–32)
Calcium: 7.2 mg/dL — ABNORMAL LOW (ref 8.9–10.3)
Chloride: 112 mmol/L — ABNORMAL HIGH (ref 98–111)
Creatinine, Ser: 1.71 mg/dL — ABNORMAL HIGH (ref 0.61–1.24)
GFR, Estimated: 45 mL/min — ABNORMAL LOW (ref >60)
Glucose, Bld: 104 mg/dL — ABNORMAL HIGH (ref 70–99)
Potassium: 3.8 mmol/L (ref 3.5–5.1)
Sodium: 134 mmol/L — ABNORMAL LOW (ref 135–145)

## 2022-10-24 LAB — BPAM RBC
Blood Product Expiration Date: 202405102359
Blood Product Expiration Date: 202405102359
Blood Product Expiration Date: 202405102359
Blood Product Expiration Date: 202405102359
ISSUE DATE / TIME: 202404161247
ISSUE DATE / TIME: 202404161247
Unit Type and Rh: 6200
Unit Type and Rh: 6200
Unit Type and Rh: 6200

## 2022-10-24 LAB — TYPE AND SCREEN
Antibody Screen: NEGATIVE
Unit division: 0
Unit division: 0
Unit division: 0
Unit division: 0
Unit division: 0

## 2022-10-24 MED ORDER — PANTOPRAZOLE SODIUM 40 MG PO TBEC
40.0000 mg | DELAYED_RELEASE_TABLET | Freq: Two times a day (BID) | ORAL | 1 refills | Status: DC
  Filled 2022-10-24: qty 60, 30d supply, fill #0

## 2022-10-24 MED ORDER — POLYETHYLENE GLYCOL 3350 17 G PO PACK
17.0000 g | PACK | Freq: Every day | ORAL | 0 refills | Status: DC | PRN
  Filled 2022-10-24: qty 14, 14d supply, fill #0

## 2022-10-24 MED ORDER — DOCUSATE SODIUM 100 MG PO CAPS
100.0000 mg | ORAL_CAPSULE | Freq: Two times a day (BID) | ORAL | 0 refills | Status: DC | PRN
  Filled 2022-10-24: qty 100, 50d supply, fill #0

## 2022-10-24 MED ORDER — DOCUSATE SODIUM 100 MG PO CAPS
100.0000 mg | ORAL_CAPSULE | Freq: Two times a day (BID) | ORAL | 0 refills | Status: DC | PRN
  Filled 2022-10-24: qty 100, 50d supply, fill #0
  Filled 2022-10-24: qty 10, 5d supply, fill #0

## 2022-10-24 MED ORDER — CARVEDILOL 3.125 MG PO TABS
3.1250 mg | ORAL_TABLET | Freq: Two times a day (BID) | ORAL | 0 refills | Status: DC
  Filled 2022-10-24: qty 60, 30d supply, fill #0

## 2022-10-24 NOTE — Discharge Summary (Signed)
Physician Discharge Summary  Patient ID: Timothy [name] MRN: [id number] DOB/AGE: 1961/01/26 62 y.o.  Admit date: 10/20/2022 Discharge date: 10/24/2022  Admission Diagnoses:  Discharge Diagnoses:  Principal Problem:   GI bleed Active Problems:   Acute upper GI bleed   Secondary esophageal varices with bleeding   Portal hypertension with esophageal varices   Discharged Condition: stable  Hospital Course: Patient is a 63 year old male with past medical history significant for  autoimmune hepatitis, previously on Imuran; and liver cirrhosis.  Patient was admitted with hematochezia that started 3 days prior to admission.  Hemoglobin on presentation was 4.9 g/dL.  Due to gastric intestinal hemorrhage, patient was admitted to the ICU, intubated and EGD done revealed esophageal varices which were banded.  Patient was extubated on 10/20/2022 and transferred to the hospitalist team.  Patient was managed supportively.  Patient was transfused with packed red blood cells.  Patient has remained stable.  GI team assisted with patient's management.  GI team has cleared patient for discharge.    Acute upper GI bleed due to esophageal varices: -Started on IV Protonix, IV Rocephin (SBP prophylaxis for 5 days) and octreotide on admission. -GI was consulted EGD was done that showed esophageal varices with status post banding. -Repeat EGD in 4 to 6 weeks as an outpatient. -Abdominal ultrasound revealed nodular liver, splenomegaly with ascites.   -Alpha-fetoprotein pending. -He is status post 2 units of packed red blood cells and a unit of fresh frozen plasma's and platelets. hemoglobin this morning 7.9 check a CBC tomorrow morning. 10/23/2022: Hemoglobin is 7.4 g/dL today.   Autoimmune hepatitis/liver cirrhosis: Follow-up with GI as an outpatient started immediate passive therapy.   Acute kidney injury, With a baseline creatinine of less than 1 on admission 1.8, this morning is 1.7 will continue IV fluids for 24  hours. -Urine sodium was greater than 10.   -Repeat BMP in 2 days.   -Aldactone is currently on hold due to AKI.    Hyperkalemia: Resolved with IV fluids rotation. 10/23/2022: Potassium is 3.7 today.  Consults: GI  Significant Diagnostic Studies:  EGD revealed: -Esophageal varices with bleeding -Gastrointestinal hemorrhage, unspecified -Polyp of stomach and duodenum.  Abdominal ultrasound revealed: Nodular heterogeneous liver with mild splenomegaly and some ascites.   Distended gallbladder with stones.   No intrahepatic biliary ductal dilatation. The common duct is obscured. There are several other structures partially obscured by overlapping bowel gas and soft tissue as well including in the right kidney, aorta and pancreas.     Hemoglobin: Hb of 4.9 g/dL on presentation.  Prior to discharge, hemoglobin was 7.4 g/dL.  Treatments:  EGD, flexible, transoral, with band ligation of esophageal/gastric varices.  Discharge Exam: Blood pressure 114/68, pulse 70, temperature 98.5 F (36.9 C), temperature source Oral, resp. rate 18, height 6' 1.5" (1.867 m), weight 101.8 kg, SpO2 95 %.   Disposition: Discharge disposition: 01-Home or Self Care       Discharge Instructions     Diet - low sodium heart healthy   Complete by: As directed    Increase activity slowly   Complete by: As directed       Allergies as of 10/24/2022       Reactions   Nsaids    Patient denies any allergy to NSAIDs (particular Advil) He takes this medication frequently without any adverse side effects         Medication List     STOP taking these medications    nebivolol 10 MG tablet Commonly known  as: BYSTOLIC   spironolactone 50 MG tablet Commonly known as: Aldactone       TAKE these medications    carvedilol 3.125 MG tablet Commonly known as: COREG Take 1 tablet (3.125 mg total) by mouth 2 (two) times daily with a meal.   docusate sodium 100 MG capsule Commonly known as:  COLACE Take 1 capsule (100 mg total) by mouth 2 (two) times daily as needed for mild constipation.   pantoprazole 40 MG tablet Commonly known as: PROTONIX Take 1 tablet (40 mg total) by mouth 2 (two) times daily before a meal.   polyethylene glycol 17 g packet Commonly known as: MIRALAX / GLYCOLAX Take 17 g by mouth daily as needed for moderate constipation.        Follow-up Information     Meredith Pel, NP Follow up on 12/21/2022.   Specialty: Gastroenterology Why: 11:30 AM Contact information: [address] [place] [address] [phone]                 Time spent: 36 minutes.  SignedBarnetta Chapel 10/24/2022, 2:28 PM

## 2022-10-24 NOTE — TOC Transition Note (Signed)
Transition of Care [facility]) - CM/SW Discharge Note   Patient Details  Name: Timothy [name] MRN: [id number] Date of Birth: 09-16-1960  Transition of Care [facility]) CM/SW Contact:  Lawerance Sabal, RN Phone Number: 10/24/2022, 2:56 PM   Clinical Narrative:     Provided with MATCH letter to bedside. Advised [facility] closes at 4:30        Patient Goals and CMS Choice      Discharge Placement                         Discharge Plan and Services Additional resources added to the After Visit Summary for                                       Social Determinants of Health (SDOH) Interventions SDOH Screenings   Depression (PHQ2-9): Low Risk  (04/29/2022)  Tobacco Use: Low Risk  (10/22/2022)     Readmission Risk Interventions     No data to display

## 2022-10-26 ENCOUNTER — Telehealth: Payer: Self-pay

## 2022-10-26 NOTE — Telephone Encounter (Signed)
From the discharge call:  He stated he is feeling better but said his feet and ankles swell at times like they did in the hospital.  He is also concerned that he is uninsured and is not sure if he is eligible for Medicaid. He said someone spoke to him in the hospital and told him that they would mail him an application  I explained to him that I can have a Medicaid eligibilty caseworker contact him and assist him with applying if he is eligible and he was very appreciative of the assistance.  He said he has all of his medications and did not have any questions about the med regime  He said he was instructed to eat soft foods; but he has been eating alot of soups. We discussed that limiting the sodim intake is not just eliminating extra salt added to foods but it is also the salt that is already in the canned/prepared foods that he eats.  Follow-up Provider: Dr Alvis Lemmings, 11/05/2022.

## 2022-10-26 NOTE — Transitions of Care (Post Inpatient/ED Visit) (Signed)
   10/26/2022  Name: Timothy [name] MRN: [id number] DOB: 1960/11/04  Today's TOC FU Call Status: Today's TOC FU Call Status:: Successful TOC FU Call Competed TOC FU Call Complete Date: 10/26/22  Transition Care Management Follow-up Telephone Call Date of Discharge: 10/24/22 Discharge Facility: [facility]) Type of Discharge: Inpatient Admission Primary Inpatient Discharge Diagnosis:: GI Bleed How have you been since you were released from the hospital?: Better Any questions or concerns?: Yes Patient Questions/Concerns:: He said his feet and ankles swell at times like they did in the hospital.  He is also concerned that he is uninsured and is not sure if he is eligible for Medicaid. He said someone spoke to him in the hospital and told him that they would mail him an application  I explained to him that I can have a Medicaid eligibilty caseworker contact him and assist him with applying if he is eligible and he was very appreciative of the assistance. Patient Questions/Concerns Addressed: Notified Provider of Patient Questions/Concerns, Other: (referral made to Medicaid eligibility case workers at [facility])  Items Reviewed: Did you receive and understand the discharge instructions provided?: Yes Medications obtained and verified?: Yes (Medications Reviewed) (H said he has all of his medications and did not have any questions about the med regime) Any new allergies since your discharge?: No Dietary orders reviewed?: Yes Type of Diet Ordered:: heart healthy.  He said he was instructed to eat soft foods; but he has been eating alot of soups. We discussed that limiting the sodim intake is not just eliminating extra salt added to foods but it is also the salt that is already in the canned/prepared foods that he eats. Do you have support at home?: Yes  Home Care and Equipment/Supplies: Were Home Health Services Ordered?: No Any new equipment or medical supplies ordered?:  No  Functional Questionnaire: Do you need assistance with bathing/showering or dressing?: No Do you need assistance with meal preparation?: No Do you need assistance with eating?: No Do you have difficulty maintaining continence: No Do you need assistance with getting out of bed/getting out of a chair/moving?: No Do you have difficulty managing or taking your medications?: No  Follow up appointments reviewed: PCP Follow-up appointment confirmed?: Yes Date of PCP follow-up appointment?: 11/05/22 Follow-up Provider: [facility] Follow-up appointment confirmed?: Yes Date of Specialist follow-up appointment?: 11/11/22 Follow-Up Specialty Provider:: GI-EGD scheduled.  12/21/2022 - [facility] Do you need transportation to your follow-up appointment?: No Do you understand care options if your condition(s) worsen?: Yes-patient verbalized understanding    SIGNATURE  Robyne Peers, RN

## 2022-10-30 ENCOUNTER — Telehealth: Payer: Self-pay | Admitting: Internal Medicine

## 2022-10-30 ENCOUNTER — Other Ambulatory Visit: Payer: Self-pay

## 2022-10-30 MED ORDER — NADOLOL 20 MG PO TABS
20.0000 mg | ORAL_TABLET | Freq: Every day | ORAL | 3 refills | Status: DC
  Filled 2022-10-30: qty 30, 30d supply, fill #0
  Filled 2022-12-01: qty 30, 30d supply, fill #1
  Filled 2022-12-31: qty 30, 30d supply, fill #2

## 2022-10-30 NOTE — Telephone Encounter (Signed)
Pt currently taking Coreg 3.125 BID along with Protonix 40mg  BID. Reports since starting the medication his feet, ankles, and calves have been swollen. Reports they don't go down at night and he cannot wear his shoes and when he walks his feet hurt. Please advise.

## 2022-10-30 NOTE — Telephone Encounter (Signed)
PT was prescribed pantoprazole and carvedilol and it is making his feet and legs swell. He is very concerned. Please advise.

## 2022-10-30 NOTE — Telephone Encounter (Signed)
Spoke with pt and he is aware of Dr. Lauro Franklin recommendations. Script sent to pharmacy.

## 2022-10-30 NOTE — Telephone Encounter (Signed)
Would substitute nadolol 20 mg for coreg and see if symptom improves If not he should let us know promptly If worsening edema he should also let me know JMP

## 2022-11-02 ENCOUNTER — Other Ambulatory Visit: Payer: Self-pay

## 2022-11-02 DIAGNOSIS — K7460 Unspecified cirrhosis of liver: Secondary | ICD-10-CM

## 2022-11-02 NOTE — Telephone Encounter (Signed)
Pt called back and states that since changing to nadolol he has not noticed much of a difference. States he is walking with a walker, his right foot is so swollen he can't put much weight on it. He thinks the protonix is causing him to be extremely bloated. He wants to know what type of food restrictions, if any, he needs to be following. Pt also states he want to know/understand exactly what is wrong with him. Please advise.

## 2022-11-02 NOTE — Telephone Encounter (Signed)
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 I would like to see him in the office; please put him into see me or an APP in the next 5 to 10 days if possible.  Hopefully there will be a cancellation to accommodate this appointment but if not please let me know He needs a CBC, CMP and INR, please check IgG and ANA and anti-smooth muscle as well

## 2022-11-02 NOTE — Telephone Encounter (Signed)
Spoke with pt and he is aware of lab recommendations, orders in epic. Pt knows we are looking for a cancellation for an OV and will be in contact.

## 2022-11-02 NOTE — Telephone Encounter (Signed)
The first APP appt is June 3rd and you do not have anything available soon either. Please advise.

## 2022-11-02 NOTE — Telephone Encounter (Signed)
Patient called regarding an appointment, states he is waiting for a call back still. Please advise, thank you.

## 2022-11-02 NOTE — Telephone Encounter (Signed)
PT is still having issues with bloating while taking protonix. He is also still having swelling in feet and cannot walk. He is seeking recommendations on alternative as well as the special diet he should be on. Please advise.

## 2022-11-02 NOTE — Telephone Encounter (Signed)
Can you watch over the next 24-48 hrs for a clinic cancellation for me or APP? Give him that appt when it opens up

## 2022-11-03 NOTE — Telephone Encounter (Signed)
Checked app schedules and Dr. Lauro Franklin, no cancellations found.

## 2022-11-04 ENCOUNTER — Other Ambulatory Visit (INDEPENDENT_AMBULATORY_CARE_PROVIDER_SITE_OTHER): Payer: Medicaid Other

## 2022-11-04 DIAGNOSIS — K7460 Unspecified cirrhosis of liver: Secondary | ICD-10-CM | POA: Diagnosis not present

## 2022-11-04 LAB — CBC WITH DIFFERENTIAL/PLATELET
Basophils Absolute: 0.1 10*3/uL (ref 0.0–0.1)
Basophils Relative: 1.1 % (ref 0.0–3.0)
Eosinophils Absolute: 0.2 10*3/uL (ref 0.0–0.7)
Eosinophils Relative: 5 % (ref 0.0–5.0)
HCT: 27.6 % — ABNORMAL LOW (ref 39.0–52.0)
Hemoglobin: 9.1 g/dL — ABNORMAL LOW (ref 13.0–17.0)
Lymphocytes Relative: 28.5 % (ref 12.0–46.0)
Lymphs Abs: 1.3 10*3/uL (ref 0.7–4.0)
MCHC: 32.8 g/dL (ref 30.0–36.0)
MCV: 88 fl (ref 78.0–100.0)
Monocytes Absolute: 0.7 10*3/uL (ref 0.1–1.0)
Monocytes Relative: 15.1 % — ABNORMAL HIGH (ref 3.0–12.0)
Neutro Abs: 2.4 10*3/uL (ref 1.4–7.7)
Neutrophils Relative %: 50.3 % (ref 43.0–77.0)
Platelets: 110 10*3/uL — ABNORMAL LOW (ref 150.0–400.0)
RBC: 3.14 Mil/uL — ABNORMAL LOW (ref 4.22–5.81)
RDW: 15.8 % — ABNORMAL HIGH (ref 11.5–15.5)
WBC: 4.7 10*3/uL (ref 4.0–10.5)

## 2022-11-04 LAB — COMPREHENSIVE METABOLIC PANEL
ALT: 21 U/L (ref 0–53)
AST: 52 U/L — ABNORMAL HIGH (ref 0–37)
Albumin: 2.6 g/dL — ABNORMAL LOW (ref 3.5–5.2)
Alkaline Phosphatase: 57 U/L (ref 39–117)
BUN: 14 mg/dL (ref 6–23)
CO2: 24 mEq/L (ref 19–32)
Calcium: 8 mg/dL — ABNORMAL LOW (ref 8.4–10.5)
Chloride: 108 mEq/L (ref 96–112)
Creatinine, Ser: 1.27 mg/dL (ref 0.40–1.50)
GFR: 60.45 mL/min (ref >60.00)
Glucose, Bld: 94 mg/dL (ref 70–99)
Potassium: 4.1 mEq/L (ref 3.5–5.1)
Sodium: 138 mEq/L (ref 135–145)
Total Bilirubin: 1.2 mg/dL (ref 0.2–1.2)
Total Protein: 6.6 g/dL (ref 6.0–8.3)

## 2022-11-04 LAB — PROTIME-INR
INR: 1.4 ratio — ABNORMAL HIGH (ref 0.8–1.0)
Prothrombin Time: 15 s — ABNORMAL HIGH (ref 9.6–13.1)

## 2022-11-05 ENCOUNTER — Inpatient Hospital Stay: Payer: Self-pay | Admitting: Family Medicine

## 2022-11-05 NOTE — Telephone Encounter (Signed)
Left another message for pt to call back.

## 2022-11-05 NOTE — Telephone Encounter (Signed)
Called and left pt message to see if he can come in and see Doug Sou PA today at 1:30pm. Betsey Holiday has been held.

## 2022-11-07 LAB — IGG: IgG (Immunoglobin G), Serum: 2472 mg/dL — ABNORMAL HIGH (ref 600–1540)

## 2022-11-09 LAB — ANTI-NUCLEAR AB-TITER (ANA TITER): ANA Titer 1: 1:320 {titer} — ABNORMAL HIGH

## 2022-11-09 LAB — ANA: Anti Nuclear Antibody (ANA): POSITIVE — AB

## 2022-11-09 LAB — ANTI-SMOOTH MUSCLE ANTIBODY, IGG: Actin (Smooth Muscle) Antibody (IGG): 26 U — ABNORMAL HIGH (ref <20)

## 2022-11-09 NOTE — Telephone Encounter (Signed)
Pt never returned call

## 2022-11-10 NOTE — Anesthesia Preprocedure Evaluation (Signed)
Anesthesia Evaluation  Patient identified by MRN, date of birth, ID band Patient awake    Reviewed: Allergy & Precautions, H&P , NPO status , Patient's Chart, lab work & pertinent test results  Airway Mallampati: II  TM Distance: >3 FB Neck ROM: Full    Dental no notable dental hx. (+) Poor Dentition, Dental Advisory Given   Pulmonary neg pulmonary ROS   Pulmonary exam normal breath sounds clear to auscultation       Cardiovascular Exercise Tolerance: Good hypertension, Pt. on medications and Pt. on home beta blockers  Rhythm:Regular Rate:Normal     Neuro/Psych negative neurological ROS  negative psych ROS   GI/Hepatic negative GI ROS,,,(+) Cirrhosis   Esophageal Varices      Endo/Other  negative endocrine ROS    Renal/GU negative Renal ROS  negative genitourinary   Musculoskeletal   Abdominal   Peds  Hematology negative hematology ROS (+)   Anesthesia Other Findings   Reproductive/Obstetrics negative OB ROS                             Anesthesia Physical Anesthesia Plan  ASA: 3  Anesthesia Plan: MAC   Post-op Pain Management: Minimal or no pain anticipated   Induction: Intravenous  PONV Risk Score and Plan: 1 and Propofol infusion  Airway Management Planned: Natural Airway and Nasal Cannula  Additional Equipment:   Intra-op Plan:   Post-operative Plan:   Informed Consent: I have reviewed the patients History and Physical, chart, labs and discussed the procedure including the risks, benefits and alternatives for the proposed anesthesia with the patient or authorized representative who has indicated his/her understanding and acceptance.     Dental advisory given  Plan Discussed with: CRNA  Anesthesia Plan Comments:        Anesthesia Quick Evaluation

## 2022-11-11 ENCOUNTER — Other Ambulatory Visit: Payer: Self-pay

## 2022-11-11 ENCOUNTER — Ambulatory Visit (HOSPITAL_COMMUNITY)
Admission: RE | Admit: 2022-11-11 | Discharge: 2022-11-11 | Disposition: A | Discharge status: Home / Self Care | Payer: Medicaid Other | Attending: Internal Medicine | Admitting: Internal Medicine

## 2022-11-11 ENCOUNTER — Ambulatory Visit (HOSPITAL_BASED_OUTPATIENT_CLINIC_OR_DEPARTMENT_OTHER): Payer: Medicaid Other

## 2022-11-11 ENCOUNTER — Encounter (HOSPITAL_COMMUNITY): Payer: Self-pay | Admitting: Internal Medicine

## 2022-11-11 ENCOUNTER — Ambulatory Visit: Payer: Self-pay | Admitting: Critical Care Medicine

## 2022-11-11 ENCOUNTER — Ambulatory Visit (HOSPITAL_COMMUNITY): Payer: Medicaid Other

## 2022-11-11 ENCOUNTER — Encounter (HOSPITAL_COMMUNITY)
Admission: RE | Disposition: A | Discharge status: Home / Self Care | Payer: Self-pay | Source: Home / Self Care | Attending: Internal Medicine

## 2022-11-11 DIAGNOSIS — N179 Acute kidney failure, unspecified: Secondary | ICD-10-CM | POA: Diagnosis not present

## 2022-11-11 DIAGNOSIS — K317 Polyp of stomach and duodenum: Secondary | ICD-10-CM | POA: Diagnosis not present

## 2022-11-11 DIAGNOSIS — I10 Essential (primary) hypertension: Secondary | ICD-10-CM | POA: Diagnosis not present

## 2022-11-11 DIAGNOSIS — K7460 Unspecified cirrhosis of liver: Secondary | ICD-10-CM

## 2022-11-11 DIAGNOSIS — I8510 Secondary esophageal varices without bleeding: Secondary | ICD-10-CM | POA: Diagnosis not present

## 2022-11-11 DIAGNOSIS — D62 Acute posthemorrhagic anemia: Secondary | ICD-10-CM | POA: Insufficient documentation

## 2022-11-11 DIAGNOSIS — K920 Hematemesis: Secondary | ICD-10-CM

## 2022-11-11 DIAGNOSIS — I8511 Secondary esophageal varices with bleeding: Secondary | ICD-10-CM

## 2022-11-11 DIAGNOSIS — K922 Gastrointestinal hemorrhage, unspecified: Secondary | ICD-10-CM

## 2022-11-11 DIAGNOSIS — I8500 Esophageal varices without bleeding: Secondary | ICD-10-CM

## 2022-11-11 HISTORY — PX: ESOPHAGEAL BANDING: SHX5518

## 2022-11-11 HISTORY — PX: ESOPHAGOGASTRODUODENOSCOPY (EGD) WITH PROPOFOL: SHX5813

## 2022-11-11 SURGERY — ESOPHAGOGASTRODUODENOSCOPY (EGD) WITH PROPOFOL
Anesthesia: Monitor Anesthesia Care

## 2022-11-11 MED ORDER — HYDRALAZINE HCL 20 MG/ML IJ SOLN
5.0000 mg | Freq: Once | INTRAMUSCULAR | Status: AC
  Administered 2022-11-11: 5 mg via INTRAVENOUS

## 2022-11-11 MED ORDER — SODIUM CHLORIDE 0.9 % IV SOLN: INTRAVENOUS | Status: DC

## 2022-11-11 MED ORDER — FUROSEMIDE 40 MG PO TABS
40.0000 mg | ORAL_TABLET | Freq: Every day | ORAL | 2 refills | Status: DC
  Filled 2022-11-11: qty 30, 30d supply, fill #0
  Filled 2022-12-09: qty 30, 30d supply, fill #1

## 2022-11-11 MED ORDER — HYDRALAZINE HCL 20 MG/ML IJ SOLN
INTRAMUSCULAR | Status: AC
  Filled 2022-11-11: qty 1

## 2022-11-11 MED ORDER — HYDRALAZINE HCL 20 MG/ML IJ SOLN
10.0000 mg | Freq: Once | INTRAMUSCULAR | Status: AC
  Administered 2022-11-11: 10 mg via INTRAVENOUS

## 2022-11-11 MED ORDER — PROPOFOL 10 MG/ML IV BOLUS
INTRAVENOUS | Status: DC | PRN
  Administered 2022-11-11 (×2): 60 mg via INTRAVENOUS
  Administered 2022-11-11: 40 mg via INTRAVENOUS
  Administered 2022-11-11: 20 mg via INTRAVENOUS
  Administered 2022-11-11: 30 mg via INTRAVENOUS
  Administered 2022-11-11: 80 mg via INTRAVENOUS

## 2022-11-11 MED ORDER — LACTATED RINGERS IV SOLN: INTRAVENOUS | Status: DC | PRN

## 2022-11-11 MED ORDER — LIDOCAINE 2% (20 MG/ML) 5 ML SYRINGE
INTRAMUSCULAR | Status: DC | PRN
  Administered 2022-11-11: 100 mg via INTRAVENOUS

## 2022-11-11 MED ORDER — SPIRONOLACTONE 100 MG PO TABS
100.0000 mg | ORAL_TABLET | Freq: Every day | ORAL | 11 refills | Status: DC
  Filled 2022-11-11: qty 30, 30d supply, fill #0
  Filled 2022-12-18 (×2): qty 30, 30d supply, fill #1

## 2022-11-11 SURGICAL SUPPLY — 15 items

## 2022-11-11 NOTE — Discharge Instructions (Signed)
YOU HAD AN ENDOSCOPIC PROCEDURE TODAY: Refer to the procedure report and other information in the discharge instructions given to you for any specific questions about what was found during the examination. If this information does not answer your questions, please call [place] office at [phone] to clarify.  ° °YOU SHOULD EXPECT: Some feelings of bloating in the abdomen. Passage of more gas than usual. Walking can help get rid of the air that was put into your GI tract during the procedure and reduce the bloating. If you had a lower endoscopy (such as a colonoscopy or flexible sigmoidoscopy) you may notice spotting of blood in your stool or on the toilet paper. Some abdominal soreness may be present for a day or two, also. ° °DIET: Your first meal following the procedure should be a light meal and then it is ok to progress to your normal diet. A half-sandwich or bowl of soup is an example of a good first meal. Heavy or fried foods are harder to digest and may make you feel nauseous or bloated. Drink plenty of fluids but you should avoid alcoholic beverages for 24 hours. If you had a esophageal dilation, please see attached instructions for diet.   ° °ACTIVITY: Your care partner should take you home directly after the procedure. You should plan to take it easy, moving slowly for the rest of the day. You can resume normal activity the day after the procedure however YOU SHOULD NOT DRIVE, use power tools, machinery or perform tasks that involve climbing or major physical exertion for 24 hours (because of the sedation medicines used during the test).  ° °SYMPTOMS TO REPORT IMMEDIATELY: °A gastroenterologist can be reached at any hour. Please call [phone]  for any of the following symptoms:  °Following lower endoscopy (colonoscopy, flexible sigmoidoscopy) °Excessive amounts of blood in the stool  °Significant tenderness, worsening of abdominal pains  °Swelling of the abdomen that is new, acute  °Fever of 100° or  higher  °Following upper endoscopy (EGD, EUS, ERCP, esophageal dilation) °Vomiting of blood or coffee ground material  °New, significant abdominal pain  °New, significant chest pain or pain under the shoulder blades  °Painful or persistently difficult swallowing  °New shortness of breath  °Black, tarry-looking or red, bloody stools ° °FOLLOW UP:  °If any biopsies were taken you will be contacted by phone or by letter within the next 1-3 weeks. Call [phone]  if you have not heard about the biopsies in 3 weeks.  °Please also call with any specific questions about appointments or follow up tests. ° °

## 2022-11-11 NOTE — Anesthesia Postprocedure Evaluation (Signed)
Anesthesia Post Note  Patient: [name]  Procedure(s) Performed: ESOPHAGOGASTRODUODENOSCOPY (EGD) WITH PROPOFOL ESOPHAGEAL BANDING     Patient location during evaluation: Endoscopy Anesthesia Type: MAC Level of consciousness: awake and alert Pain management: pain level controlled Vital Signs Assessment: post-procedure vital signs reviewed and stable Respiratory status: spontaneous breathing, nonlabored ventilation and respiratory function stable Cardiovascular status: stable and blood pressure returned to baseline Postop Assessment: no apparent nausea or vomiting Anesthetic complications: no  No notable events documented.  Last Vitals:  Vitals:   11/11/22 1110 11/11/22 1115  BP: 125/76   Pulse: 80 81  Resp: 16 (!) 23  Temp:    SpO2: 100% 97%    Last Pain:  Vitals:   11/11/22 0955  TempSrc:   PainSc: 6                  Rosalyn Archambault,W. EDMOND

## 2022-11-11 NOTE — Transfer of Care (Signed)
Immediate Anesthesia Transfer of Care Note  Patient: Timothy [name]  Procedure(s) Performed: ESOPHAGOGASTRODUODENOSCOPY (EGD) WITH PROPOFOL ESOPHAGEAL BANDING  Patient Location: PACU and Endoscopy Unit  Anesthesia Type:MAC  Level of Consciousness: awake  Airway & Oxygen Therapy: Patient Spontanous Breathing and Patient connected to nasal cannula oxygen  Post-op Assessment: Report given to RN and Post -op Vital signs reviewed and stable  Post vital signs: Reviewed and stable  Last Vitals:  Vitals Value Taken Time  BP    Temp    Pulse    Resp    SpO2      Last Pain:  Vitals:   11/11/22 0820  TempSrc: Temporal  PainSc: 0-No pain         Complications: No notable events documented.

## 2022-11-11 NOTE — Interval H&P Note (Signed)
History and Physical Interval Note: Patient presenting back for variceal banding protocol EGD after upper GI bleed less than 4 weeks ago No recent melena no further nausea, vomiting and therefore no hematemesis The nature of the procedure, as well as the risks, benefits, and alternatives were carefully and thoroughly reviewed with the patient. Ample time for discussion and questions allowed. The patient understood, was satisfied, and agreed to proceed.      Latest Ref Rng & Units 11/04/2022   12:51 PM 10/23/2022    1:13 AM 10/22/2022    2:00 AM  CBC  WBC 4.0 - 10.5 K/uL 4.7  7.1  9.4   Hemoglobin 13.0 - 17.0 g/dL 9.1  7.4  7.9   Hematocrit 39.0 - 52.0 % 27.6  21.5  22.9   Platelets 150.0 - 400.0 K/uL 110.0  54  59    CMP     Component Value Date/Time   NA 138 11/04/2022 1251   NA 141 04/29/2022 1006   NA 141 01/27/2016 1005   K 4.1 11/04/2022 1251   K 4.1 01/27/2016 1005   CL 108 11/04/2022 1251   CO2 24 11/04/2022 1251   CO2 24 01/27/2016 1005   GLUCOSE 94 11/04/2022 1251   GLUCOSE 98 01/27/2016 1005   BUN 14 11/04/2022 1251   BUN 17 04/29/2022 1006   BUN 15.0 01/27/2016 1005   CREATININE 1.27 11/04/2022 1251   CREATININE 1.2 01/27/2016 1005   CALCIUM 8.0 (L) 11/04/2022 1251   CALCIUM 8.8 01/27/2016 1005   PROT 6.6 11/04/2022 1251   PROT 7.2 04/29/2022 1006   PROT 7.8 01/27/2016 1005   ALBUMIN 2.6 (L) 11/04/2022 1251   ALBUMIN 3.3 (L) 04/29/2022 1006   ALBUMIN 3.2 (L) 01/27/2016 1005   AST 52 (H) 11/04/2022 1251   AST 46 (H) 01/27/2016 1005   ALT 21 11/04/2022 1251   ALT 30 01/27/2016 1005   ALKPHOS 57 11/04/2022 1251   ALKPHOS 93 01/27/2016 1005   BILITOT 1.2 11/04/2022 1251   BILITOT 0.7 04/29/2022 1006   BILITOT 0.71 01/27/2016 1005   GFRNONAA 45 (L) 10/24/2022 0050   GFRAA 71 07/30/2020 1148   Lab Results  Component Value Date   INR 1.4 (H) 11/04/2022   INR 1.3 (H) 10/20/2022   INR 2.8 (H) 10/20/2022      11/11/2022 8:58 AM  Timothy [name]  has presented  today for surgery, with the diagnosis of cirrhosis, Varicies.  The various methods of treatment have been discussed with the patient and family. After consideration of risks, benefits and other options for treatment, the patient has consented to  Procedure(s): ESOPHAGOGASTRODUODENOSCOPY (EGD) WITH PROPOFOL (N/A) as a surgical intervention.  The patient's history has been reviewed, patient examined, no change in status, stable for surgery.  I have reviewed the patient's chart and labs.  Questions were answered to the patient's satisfaction.     Carie Caddy Fields Oros

## 2022-11-11 NOTE — Op Note (Signed)
[facility] Patient Name: Timothy [name] Procedure Date : 11/11/2022 MRN: [id number] Attending MD: Beverley Fiedler , MD, [id number] Date of Birth: 1960/07/13 CSN: [id number] Age: 62 Admit Type: Inpatient Procedure:                Upper GI endoscopy Indications:              2nd degree variceal eradication (following bleed),                            variceal bleed 10/20/22 Providers:                Carie Caddy. Rhea Belton, MD, Fransisca Connors, Priscella Mann,                            Technician Referring MD:             Shan Levans, MD Medicines:                Monitored Anesthesia Care Complications:            No immediate complications. Estimated Blood Loss:     Estimated blood loss: none. Procedure:                Pre-Anesthesia Assessment:                           - Prior to the procedure, a History and Physical                            was performed, and patient medications and                            allergies were reviewed. The patient's tolerance of                            previous anesthesia was also reviewed. The risks                            and benefits of the procedure and the sedation                            options and risks were discussed with the patient.                            All questions were answered, and informed consent                            was obtained. Prior Anticoagulants: The patient has                            taken no anticoagulant or antiplatelet agents. ASA                            Grade Assessment: III - A patient with severe  systemic disease. After reviewing the risks and                            benefits, the patient was deemed in satisfactory                            condition to undergo the procedure.                           After obtaining informed consent, the endoscope was                            passed under direct vision. Throughout the                            procedure, the  patient's blood pressure, pulse, and                            oxygen saturations were monitored continuously. The                            [id number]-[id number] ([id number]) Olympus endoscope was introduced                            through the mouth, and advanced to the second part                            of duodenum. The upper GI endoscopy was                            accomplished without difficulty. The patient                            tolerated the procedure well. Scope In: Scope Out: Findings:      Grade III varices were found in the middle third of the esophagus and in       the lower third of the esophagus. They were large in size. Seven bands       were successfully placed with incomplete eradication of varices. There       was no bleeding during and at the end of the procedure.      The entire examined stomach was normal.      One 5 mm sessile polyp was found in the second portion of the duodenum.      The exam of the duodenum was otherwise normal. Impression:               - Grade III esophageal varices. Incompletely                            eradicated. Banded x 7.                           - Normal stomach.                           - One  small duodenal polyp. Attention on follow-up.                           - No specimens collected. Moderate Sedation:      N/A Recommendation:           - Patient has a contact number available for                            emergencies. The signs and symptoms of potential                            delayed complications were discussed with the                            patient. Return to normal activities tomorrow.                            Written discharge instructions were provided to the                            patient.                           - Low sodium diet.                           - Continue present medications.                           - Begin Lasix 40 mg and Aldactone 100 mg daily for                            ascites  and lower extremity edema. Bloating is                            ascites, not beta blocker or PPI side effect.                           - Continue nadolol 20 mg daily.                           - Repeat upper endoscopy in 4-6 weeks for                            retreatment.                           - BMP in one week given diuretic start.                           - Keep office visit with Willette Cluster on                            12/21/2022 at 11:30 AM. I will work on getting  patient an office visit later this month (urgent                            slot with me or APP). Procedure Code(s):        --- Professional ---                           [phone], Esophagogastroduodenoscopy, flexible,                            transoral; with band ligation of esophageal/gastric                            varices Diagnosis Code(s):        --- Professional ---                           I85.00, Esophageal varices without bleeding                           K31.7, Polyp of stomach and duodenum CPT copyright 2022 [employer]. All rights reserved. The codes documented in this report are preliminary and upon coder review may  be revised to meet current compliance requirements. Beverley Fiedler, MD 11/11/2022 9:35:47 AM This report has been signed electronically. Number of Addenda: 0

## 2022-11-12 ENCOUNTER — Other Ambulatory Visit: Payer: Self-pay

## 2022-11-15 ENCOUNTER — Encounter (HOSPITAL_COMMUNITY): Payer: Self-pay | Admitting: Internal Medicine

## 2022-11-17 ENCOUNTER — Telehealth: Payer: Self-pay

## 2022-11-17 ENCOUNTER — Other Ambulatory Visit: Payer: Self-pay

## 2022-11-17 DIAGNOSIS — K7460 Unspecified cirrhosis of liver: Secondary | ICD-10-CM

## 2022-11-17 DIAGNOSIS — I8511 Secondary esophageal varices with bleeding: Secondary | ICD-10-CM

## 2022-11-17 NOTE — Telephone Encounter (Signed)
Pt scheduled for EGD with banding at [facility] 12/07/22 at 8:30am, pt to arrive there at 7am. Amb referral in epic, pt mailed instructions. ZOXW#[id number]. Pt aware of appt.

## 2022-11-17 NOTE — Telephone Encounter (Signed)
-----   Message from Beverley Fiedler, MD sent at 11/16/2022  9:18 AM EDT ----- Regarding: FW: Ok to sch with Marina Goodell but 830 or later  JMP ----- Message ----- From: Hilarie Fredrickson, MD Sent: 11/15/2022   9:21 PM EDT To: Beverley Fiedler, MD  Sure. Preferably not before 8:30. Thanks, JP  ----- Message ----- From: Beverley Fiedler, MD Sent: 11/12/2022  10:21 AM EDT To: Hilarie Fredrickson, MD  Would you be available to do an EGD for variceal banding (will be session #3) on 12/07/22 in your [facility] outpt block? As of this message you are the only [facility] provider with hospital availability thru mid June Decomp cirrhosis, had bleed in April.  Banded yesterday by me.  Went fine. Thanks for considering. JMP

## 2022-11-18 ENCOUNTER — Ambulatory Visit: Payer: Medicaid Other | Admitting: Physician Assistant

## 2022-11-18 ENCOUNTER — Encounter: Payer: Self-pay | Admitting: Physician Assistant

## 2022-11-18 ENCOUNTER — Other Ambulatory Visit (INDEPENDENT_AMBULATORY_CARE_PROVIDER_SITE_OTHER): Payer: Medicaid Other

## 2022-11-18 ENCOUNTER — Other Ambulatory Visit: Payer: Self-pay

## 2022-11-18 VITALS — BP 106/70 | HR 73 | Ht 74.0 in | Wt 212.4 lb

## 2022-11-18 DIAGNOSIS — K754 Autoimmune hepatitis: Secondary | ICD-10-CM

## 2022-11-18 DIAGNOSIS — K7460 Unspecified cirrhosis of liver: Secondary | ICD-10-CM | POA: Diagnosis not present

## 2022-11-18 DIAGNOSIS — R188 Other ascites: Secondary | ICD-10-CM

## 2022-11-18 DIAGNOSIS — K7290 Hepatic failure, unspecified without coma: Secondary | ICD-10-CM | POA: Diagnosis not present

## 2022-11-18 DIAGNOSIS — I8511 Secondary esophageal varices with bleeding: Secondary | ICD-10-CM

## 2022-11-18 LAB — BASIC METABOLIC PANEL
BUN: 14 mg/dL (ref 6–23)
CO2: 28 mEq/L (ref 19–32)
Calcium: 8.8 mg/dL (ref 8.4–10.5)
Chloride: 104 mEq/L (ref 96–112)
Creatinine, Ser: 1.15 mg/dL (ref 0.40–1.50)
GFR: 68.08 mL/min (ref >60.00)
Glucose, Bld: 99 mg/dL (ref 70–99)
Potassium: 4.3 mEq/L (ref 3.5–5.1)
Sodium: 136 mEq/L (ref 135–145)

## 2022-11-18 MED ORDER — PREDNISONE 10 MG PO TABS
ORAL_TABLET | ORAL | 0 refills | Status: AC
  Filled 2022-11-18: qty 21, 28d supply, fill #0

## 2022-11-18 NOTE — Progress Notes (Signed)
Chief Complaint: Cirrhosis with ascites  HPI:    Mr. Buckler is a 62 year old male, known to Dr. 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    Patient scheduled 12/07/2022 for EGD.    10/20/2022 patient initially consulted by our service for upper GI bleed and history of cirrhosis.  At that time noted patient had cirrhosis, possibly decompensated with acute hemodynamic instability and upper GI bleed with rectal bleeding and massive hematemesis.  At the time MELD-NA 18 and MELD 3.0 over the 11.  At that time continued on Octreotide and Pantoprazole infusion.  Added Rocephin for SBP prophylaxis and given a dose of IV vitamin K.  Recommended patient have an EGD.  Eventual right upper quadrant ultrasound for HCC screening and AFP.    10/20/2022 EGD with large (greater than 5 mm) esophageal varices oozing blood, 1 with fibrin clot, banded x 7 and a single duodenal polyp not resected.  At that time recommended continuing PPI upon discharge and Rocephin for SBP prophylaxis.  Also recommended beta-blocker medically stable.  Recommended repeat EGD in 4 to 6 weeks.  Ultrasound when stable to assess for size and HCC.    10/23/2022 patient last seen by our service in hospital.  Continued on Octreotide for completion of 72 hours and Tonics 40 mg twice daily as well as SBP prophylaxis.  Recommend starting Nadolol 20 mg/day or Coreg 3.125 mg twice daily as outpatient.  He was scheduled for repeat EGD 5/8.    11/02/2022 patient describes some lower extremity swelling.  It was discussed that he may have a possible autoimmune hepatitis and that he needed follow-up.  Labs were checked at that time including CBC, CMP and INR as well as IgG and ANA is ASMA.    11/04/2022 labs consistent with autoimmune hepatitis.  ASMA elevated at 26.   ANA positive.  IgG elevated 2472.  INR 1.4.  CMP with AST 52 and albumin low 2.6.  CBC with a hemoglobin of 9.1.  Discussed the likely need to start steroids and Azathioprine.  Recommended to be met in 1 week.    11/11/2022 patient had repeat EGD with grade 3 esophageal varices, incompletely eradicated, banded x 7, 1 small duodenal polyp.  At that point recommended a low-sodium diet, started on Lasix 40 mg and Aldactone 100 mg daily for ascites and lower extremities edema.  We discussed that bloating was ascites not beta-blocker PPI side effect.  Continued on Nadolol 20 mg daily and repeat upper endoscopy in 4 to 6 weeks.  Recommend BMP in 1 week given diuretic start.    Today, the patient presents to clinic accompanied by his wife.  He tells me that his fluid level is much better after being started on diuretics over the past week.  Describes that the swelling in his legs has greatly reduced and his abdomen feels somewhat less bloated.  Tells me he still has a way to go.  Describes that he can still not do all the things that he used to do, he cannot even mow the grass and wonders if he would qualify for disability.  His wife tells me he does not really get up and walk around that much.  He denies any history of recent alcohol use.  Apparently 25+ years ago was his last drink.  Currently he is taking Pantoprazole 40 mg  twice daily, Nadolol 20 mg daily, Lasix 40 mg daily and Spironolactone 100 mg daily.  Denies any acute complaints or concerns.  He is on a strict low-salt diet.    Denies fever, chills, weight loss, blood in his stool, nausea or vomiting.  Past Medical History:  Diagnosis Date   Autoimmune hepatitis (HCC)    Cirrhosis of liver (HCC)    Hypertension    Thrombocytopenia (HCC)     Past Surgical History:  Procedure Laterality Date   ESOPHAGEAL BANDING  10/20/2022   Procedure: ESOPHAGEAL BANDING;  Surgeon: Beverley Fiedler, MD;  Location: [facility] ENDOSCOPY;  Service: Gastroenterology;;   ESOPHAGEAL  BANDING  11/11/2022   Procedure: ESOPHAGEAL BANDING;  Surgeon: Beverley Fiedler, MD;  Location: [facility] ENDOSCOPY;  Service: Gastroenterology;;   ESOPHAGOGASTRODUODENOSCOPY N/A 10/20/2022   Procedure: ESOPHAGOGASTRODUODENOSCOPY (EGD);  Surgeon: Beverley Fiedler, MD;  Location: [facility] ENDOSCOPY;  Service: Gastroenterology;  Laterality: N/A;  per ICU   ESOPHAGOGASTRODUODENOSCOPY (EGD) WITH PROPOFOL N/A 11/11/2022   Procedure: ESOPHAGOGASTRODUODENOSCOPY (EGD) WITH PROPOFOL;  Surgeon: Beverley Fiedler, MD;  Location: [facility] ENDOSCOPY;  Service: Gastroenterology;  Laterality: N/A;    Current Outpatient Medications  Medication Sig Dispense Refill   docusate sodium (COLACE) 100 MG capsule Take 1 capsule (100 mg total) by mouth 2 (two) times daily as needed for mild constipation. 10 capsule 0   furosemide (LASIX) 40 MG tablet Take 1 tablet (40 mg total) by mouth daily. 30 tablet 2   nadolol (CORGARD) 20 MG tablet Take 1 tablet (20 mg total) by mouth daily. 30 tablet 3   pantoprazole (PROTONIX) 40 MG tablet Take 1 tablet (40 mg total) by mouth 2 (two) times daily before a meal. 60 tablet 1   polyethylene glycol (MIRALAX / GLYCOLAX) 17 g packet Take 17 g by mouth daily as needed for moderate constipation. 14 each 0   spironolactone (ALDACTONE) 100 MG tablet Take 1 tablet (100 mg total) by mouth daily. 30 tablet 11   No current facility-administered medications for this visit.    Allergies as of 11/18/2022 - Review Complete 11/11/2022  Allergen Reaction Noted   Nsaids  04/27/2022    Family History  Problem Relation Age of Onset   Hypertension Sister     Social History   Socioeconomic History   Marital status: Single    Spouse name: Not on file   Number of children: Not on file   Years of education: Not on file   Highest education level: Not on file  Occupational History   Not on file  Tobacco Use   Smoking status: Never   Smokeless tobacco: Never  Vaping Use   Vaping Use: Never used  Substance and Sexual  Activity   Alcohol use: No    Alcohol/week: 0.0 standard drinks of alcohol   Drug use: No   Sexual activity: Yes  Other Topics Concern   Not on file  Social History Narrative   Not on file   Social Determinants of Health   Financial Resource Strain: Not on file  Food Insecurity: Not on file  Transportation Needs: Not on file  Physical Activity: Not on file  Stress: Not on file  Social Connections: Not on file  Intimate Partner Violence: Not on file    Review of Systems:    Constitutional: No weight loss, fever or chills Cardiovascular: No chest pain   Respiratory: No SOB  Gastrointestinal: See HPI and otherwise negative   Physical Exam:  Vital signs: BP 106/70  Pulse 73   Ht 6\' 2"  (1.88 m)   Wt 212 lb 6.4 oz (96.3 kg)   BMI 27.27 kg/m    Constitutional:   Pleasant AA male appears to be in NAD, Well developed, Well nourished, alert and cooperative Respiratory: Respirations even and unlabored. Lungs clear to auscultation bilaterally.   No wheezes, crackles, or rhonchi.  Cardiovascular: Normal S1, S2. No MRG. Regular rate and rhythm. No peripheral edema, cyanosis or pallor.  Gastrointestinal:  Soft, Mild distension, nontender. No rebound or guarding. Normal bowel sounds. No appreciable masses or hepatomegaly. Rectal:  Not performed.  Msk:  Symmetrical without gross deformities.  +b/l pitting edema to level of the knee Psychiatric: Demonstrates good judgement and reason without abnormal affect or behaviors.  RELEVANT LABS AND IMAGING: CBC    Component Value Date/Time   WBC 4.7 11/04/2022 1251   RBC 3.14 (L) 11/04/2022 1251   HGB 9.1 (L) 11/04/2022 1251   HGB 12.2 (L) 04/29/2022 1006   HGB 14.5 01/27/2016 1005   HCT 27.6 (L) 11/04/2022 1251   HCT 36.2 (L) 04/29/2022 1006   HCT 42.1 01/27/2016 1005   PLT 110.0 (L) 11/04/2022 1251   PLT 46 (LL) 04/29/2022 1006   MCV 88.0 11/04/2022 1251   MCV 89 04/29/2022 1006   MCV 84.7 01/27/2016 1005   MCH 30.2 10/23/2022  0113   MCHC 32.8 11/04/2022 1251   RDW 15.8 (H) 11/04/2022 1251   RDW 13.7 04/29/2022 1006   RDW 15.6 (H) 01/27/2016 1005   LYMPHSABS 1.3 11/04/2022 1251   LYMPHSABS 0.8 04/29/2022 1006   LYMPHSABS 1.8 01/27/2016 1005   MONOABS 0.7 11/04/2022 1251   MONOABS 0.5 01/27/2016 1005   EOSABS 0.2 11/04/2022 1251   EOSABS 0.3 04/29/2022 1006   BASOSABS 0.1 11/04/2022 1251   BASOSABS 0.0 04/29/2022 1006   BASOSABS 0.0 01/27/2016 1005    CMP     Component Value Date/Time   NA 138 11/04/2022 1251   NA 141 04/29/2022 1006   NA 141 01/27/2016 1005   K 4.1 11/04/2022 1251   K 4.1 01/27/2016 1005   CL 108 11/04/2022 1251   CO2 24 11/04/2022 1251   CO2 24 01/27/2016 1005   GLUCOSE 94 11/04/2022 1251   GLUCOSE 98 01/27/2016 1005   BUN 14 11/04/2022 1251   BUN 17 04/29/2022 1006   BUN 15.0 01/27/2016 1005   CREATININE 1.27 11/04/2022 1251   CREATININE 1.2 01/27/2016 1005   CALCIUM 8.0 (L) 11/04/2022 1251   CALCIUM 8.8 01/27/2016 1005   PROT 6.6 11/04/2022 1251   PROT 7.2 04/29/2022 1006   PROT 7.8 01/27/2016 1005   ALBUMIN 2.6 (L) 11/04/2022 1251   ALBUMIN 3.3 (L) 04/29/2022 1006   ALBUMIN 3.2 (L) 01/27/2016 1005   AST 52 (H) 11/04/2022 1251   AST 46 (H) 01/27/2016 1005   ALT 21 11/04/2022 1251   ALT 30 01/27/2016 1005   ALKPHOS 57 11/04/2022 1251   ALKPHOS 93 01/27/2016 1005   BILITOT 1.2 11/04/2022 1251   BILITOT 0.7 04/29/2022 1006   BILITOT 0.71 01/27/2016 1005   GFRNONAA 45 (L) 10/24/2022 0050   GFRAA 71 07/30/2020 1148    Assessment: 1.  Decompensated autoimmune hepatitis with cirrhosis: Recent diagnosis, patient already has decompensated cirrhosis with ascites and esophageal varices, recent bleed as in HPI, status post banding x 2 with a third 1 planned, currently seems somewhat stable  Plan: 1.  Discussed the diagnosis of autoimmune hepatitis with the patient and his wife.  Also  discussed this case with Dr. Lavon Paganini at time of patient's appointment.  Given that the  patient already has cirrhosis we will have to see if Prednisone would help and we will hold on Azathioprine therapy as this could worsen condition.  At the moment patient's AST is minimally elevated, 52 at last check.  Will go ahead and give a short course of Prednisone, 10 mg daily x 2 weeks, then recheck LFTs and decrease Prednisone to 5 mg x 2 weeks and recheck LFTs again.  If there is improvement could consider continuing. 2.  Recommend strict daily weights 3.  Recommend less than 2 g sodium diet 4.  Patient also requested referral to a new PCP today.  Told him to discuss disability with them. 5.  Also referred the patient to the [facility] given overlap of autoimmune hepatitis and now cirrhosis 6.  Patient has recheck a BMP today given that he was recently started on diuretics.  This has already been ordered by Dr. Rhea Belton. 7.  Patient to follow in the hospital on June 3 for next banding with Dr. Marina Goodell and again on June 17 in clinic with Gunnar Fusi.  Hyacinth Meeker, PA-C [facility] 11/18/2022, 2:41 PM  Cc: Storm Frisk, MD

## 2022-11-18 NOTE — Patient Instructions (Addendum)
Your provider has requested that you go to the basement level for lab work before leaving today. Press "B" on the elevator. The lab is located at the first door on the left as you exit the elevator.  We have sent the following medications to your pharmacy for you to pick up at your convenience: Prednisone 10 mg daily for 2 weeks, then 5 mg daily for 2 weeks.   Come in for blood test on 12/03/22 & 12/19/22 for [place] to check your liver function.   Referral placed to [facility].  _______________________________________________________  If your blood pressure at your visit was 140/90 or greater, please contact your primary care physician to follow up on this.  _______________________________________________________  If you are age 2 or older, your body mass index should be between 23-30. Your Body mass index is 27.27 kg/m. If this is out of the aforementioned range listed, please consider follow up with your Primary Care Provider.  If you are age 12 or younger, your body mass index should be between 19-25. Your Body mass index is 27.27 kg/m. If this is out of the aformentioned range listed, please consider follow up with your Primary Care Provider.   ________________________________________________________  The [place] [facility] providers would like to encourage you to use [facility] to communicate with providers for non-urgent requests or questions.  Due to long hold times on the telephone, sending your provider a message by [facility] may be a faster and more efficient way to get a response.  Please allow 48 business hours for a response.  Please remember that this is for non-urgent requests.  _______________________________________________________

## 2022-11-23 NOTE — Progress Notes (Signed)
Referral faxed to [facility].

## 2022-11-23 NOTE — Progress Notes (Signed)
Addendum: Reviewed and agree with assessment and management plan. I agree with the prednisone but would probably start at 40 mg daily and then repeat liver enzymes in 2 to 3 weeks Would also check a TPMT because we would likely transition to azathioprine We will have to watch blood sugars with prednisone but hopefully will transition to immunomodulator Agree completely with the [facility] referral for their opinion giving probable AIH with cirrhosis He is scheduled already on June 3 for banding protocol EGD Niaja Stickley, Carie Caddy, MD

## 2022-11-27 ENCOUNTER — Telehealth: Payer: Self-pay | Admitting: Physician Assistant

## 2022-11-27 ENCOUNTER — Other Ambulatory Visit: Payer: Self-pay

## 2022-11-27 NOTE — Telephone Encounter (Signed)
Patient requesting a call back from nurse regarding an appointment that was suppose to be made with a hepatologist. Please advise, thank you.

## 2022-12-01 ENCOUNTER — Other Ambulatory Visit: Payer: Self-pay

## 2022-12-01 ENCOUNTER — Encounter (HOSPITAL_COMMUNITY): Payer: Self-pay | Admitting: Internal Medicine

## 2022-12-01 NOTE — Telephone Encounter (Signed)
Spoke with [facility] they did not receive referral. Refaxed referral again today.

## 2022-12-02 NOTE — Telephone Encounter (Signed)
Patient has been scheduled at [facility] on 01/19/23 @ 11:30 am.

## 2022-12-05 NOTE — Anesthesia Preprocedure Evaluation (Addendum)
Anesthesia Evaluation  Patient identified by MRN, date of birth, ID band Patient awake    Reviewed: Allergy & Precautions, NPO status , Patient's Chart, lab work & pertinent test results  History of Anesthesia Complications Negative for: history of anesthetic complications  Airway Mallampati: III  TM Distance: >3 FB Neck ROM: Full    Dental  (+) Partial Upper, Dental Advisory Given   Pulmonary neg pulmonary ROS   Pulmonary exam normal breath sounds clear to auscultation       Cardiovascular hypertension, (-) angina (-) Past MI, (-) Cardiac Stents and (-) CABG (-) dysrhythmias  Rhythm:Regular Rate:Normal     Neuro/Psych negative neurological ROS     GI/Hepatic negative GI ROS,,,(+) Cirrhosis  (portal HTN)  Esophageal Varices    , Hepatitis -, Autoimmune  Endo/Other  negative endocrine ROSneg diabetes    Renal/GU negative Renal ROS     Musculoskeletal   Abdominal   Peds  Hematology  (+) Blood dyscrasia (anemia, thrombocytopenia)   Anesthesia Other Findings   Reproductive/Obstetrics                             Anesthesia Physical Anesthesia Plan  ASA: 3  Anesthesia Plan: MAC   Post-op Pain Management: Minimal or no pain anticipated   Induction: Intravenous  PONV Risk Score and Plan: 1 and Propofol infusion and Treatment may vary due to age or medical condition  Airway Management Planned: Natural Airway and Nasal Cannula  Additional Equipment:   Intra-op Plan:   Post-operative Plan:   Informed Consent: I have reviewed the patients History and Physical, chart, labs and discussed the procedure including the risks, benefits and alternatives for the proposed anesthesia with the patient or authorized representative who has indicated his/her understanding and acceptance.     Dental advisory given  Plan Discussed with: CRNA and Anesthesiologist  Anesthesia Plan Comments:  (Discussed with patient risks of MAC including, but not limited to, minor pain or discomfort, hearing people in the room, and possible need for backup general anesthesia. Risks for general anesthesia also discussed including, but not limited to, sore throat, hoarse voice, chipped/damaged teeth, injury to vocal cords, nausea and vomiting, allergic reactions, lung infection, heart attack, stroke, and death. All questions answered. )       Anesthesia Quick Evaluation

## 2022-12-07 ENCOUNTER — Encounter (HOSPITAL_COMMUNITY): Payer: Self-pay | Admitting: Internal Medicine

## 2022-12-07 ENCOUNTER — Other Ambulatory Visit: Payer: Self-pay

## 2022-12-07 ENCOUNTER — Encounter (HOSPITAL_COMMUNITY)
Admission: RE | Disposition: A | Discharge status: Home / Self Care | Payer: Self-pay | Source: Home / Self Care | Attending: Internal Medicine

## 2022-12-07 ENCOUNTER — Ambulatory Visit (HOSPITAL_COMMUNITY)
Admission: RE | Admit: 2022-12-07 | Discharge: 2022-12-07 | Disposition: A | Discharge status: Home / Self Care | Payer: Medicaid Other | Attending: Internal Medicine | Admitting: Internal Medicine

## 2022-12-07 ENCOUNTER — Telehealth: Payer: Self-pay | Admitting: Internal Medicine

## 2022-12-07 ENCOUNTER — Ambulatory Visit (HOSPITAL_COMMUNITY): Payer: Medicaid Other | Admitting: Anesthesiology

## 2022-12-07 ENCOUNTER — Ambulatory Visit (HOSPITAL_BASED_OUTPATIENT_CLINIC_OR_DEPARTMENT_OTHER): Payer: Medicaid Other | Admitting: Anesthesiology

## 2022-12-07 DIAGNOSIS — D696 Thrombocytopenia, unspecified: Secondary | ICD-10-CM | POA: Diagnosis not present

## 2022-12-07 DIAGNOSIS — K3189 Other diseases of stomach and duodenum: Secondary | ICD-10-CM | POA: Diagnosis not present

## 2022-12-07 DIAGNOSIS — I8510 Secondary esophageal varices without bleeding: Secondary | ICD-10-CM

## 2022-12-07 DIAGNOSIS — K754 Autoimmune hepatitis: Secondary | ICD-10-CM | POA: Insufficient documentation

## 2022-12-07 DIAGNOSIS — K7460 Unspecified cirrhosis of liver: Secondary | ICD-10-CM | POA: Diagnosis not present

## 2022-12-07 DIAGNOSIS — I8511 Secondary esophageal varices with bleeding: Secondary | ICD-10-CM | POA: Diagnosis present

## 2022-12-07 DIAGNOSIS — I10 Essential (primary) hypertension: Secondary | ICD-10-CM | POA: Diagnosis not present

## 2022-12-07 DIAGNOSIS — K766 Portal hypertension: Secondary | ICD-10-CM | POA: Insufficient documentation

## 2022-12-07 DIAGNOSIS — I8500 Esophageal varices without bleeding: Secondary | ICD-10-CM | POA: Diagnosis not present

## 2022-12-07 HISTORY — PX: ESOPHAGEAL BANDING: SHX5518

## 2022-12-07 HISTORY — PX: ESOPHAGOGASTRODUODENOSCOPY (EGD) WITH PROPOFOL: SHX5813

## 2022-12-07 SURGERY — ESOPHAGOGASTRODUODENOSCOPY (EGD) WITH PROPOFOL
Anesthesia: Monitor Anesthesia Care

## 2022-12-07 MED ORDER — GLYCOPYRROLATE PF 0.2 MG/ML IJ SOSY
PREFILLED_SYRINGE | INTRAMUSCULAR | Status: DC | PRN
  Administered 2022-12-07: .1 mg via INTRAVENOUS

## 2022-12-07 MED ORDER — PROPOFOL 10 MG/ML IV BOLUS
INTRAVENOUS | Status: DC | PRN
  Administered 2022-12-07: 50 mg via INTRAVENOUS

## 2022-12-07 MED ORDER — PROPOFOL 500 MG/50ML IV EMUL
INTRAVENOUS | Status: AC
  Filled 2022-12-07: qty 50

## 2022-12-07 MED ORDER — LIDOCAINE 2% (20 MG/ML) 5 ML SYRINGE
INTRAMUSCULAR | Status: DC | PRN
  Administered 2022-12-07: 40 mg via INTRAVENOUS

## 2022-12-07 MED ORDER — SODIUM CHLORIDE 0.9 % IV SOLN: INTRAVENOUS | Status: DC

## 2022-12-07 MED ORDER — LACTATED RINGERS IV SOLN: INTRAVENOUS | Status: DC

## 2022-12-07 MED ORDER — PROPOFOL 500 MG/50ML IV EMUL
INTRAVENOUS | Status: DC | PRN
  Administered 2022-12-07: 100 ug/kg/min via INTRAVENOUS

## 2022-12-07 MED ORDER — EPHEDRINE SULFATE-NACL 50-0.9 MG/10ML-% IV SOSY
PREFILLED_SYRINGE | INTRAVENOUS | Status: DC | PRN
  Administered 2022-12-07 (×2): 5 mg via INTRAVENOUS

## 2022-12-07 SURGICAL SUPPLY — 15 items

## 2022-12-07 NOTE — H&P (Signed)
HISTORY OF PRESENT ILLNESS:  Timothy [name] is a 62 y.o. male with cirrhosis complicated by variceal bleeding for which she is undergone repeat banding procedures.  Primary GI is Dr. Rhea Belton.  This examination set up with me this morning to accommodate the patient's clinical needs.  He has number of questions regarding his diet.  Otherwise, he has had no significant problems.  Has had nice improvement in edema since starting diuretics  REVIEW OF SYSTEMS:  All non-GI ROS negative. Past Medical History:  Diagnosis Date   Autoimmune hepatitis (HCC)    Cirrhosis of liver (HCC)    Hypertension    Thrombocytopenia (HCC)     Past Surgical History:  Procedure Laterality Date   ESOPHAGEAL BANDING  10/20/2022   Procedure: ESOPHAGEAL BANDING;  Surgeon: Beverley Fiedler, MD;  Location[facility] ENDOSCOPY;  Service: Gastroenterology;;   ESOPHAGEAL BANDING  11/11/2022   Procedure: ESOPHAGEAL BANDING;  Surgeon: Beverley Fiedler, MD;  Location: [facility] ENDOSCOPY;  Service: Gastroenterology;;   ESOPHAGOGASTRODUODENOSCOPY N/A 10/20/2022   Procedure: ESOPHAGOGASTRODUODENOSCOPY (EGD);  Surgeon: Beverley Fiedler, MD;  Location: [facility] ENDOSCOPY;  Service: Gastroenterology;  Laterality: N/A;  per ICU   ESOPHAGOGASTRODUODENOSCOPY (EGD) WITH PROPOFOL N/A 11/11/2022   Procedure: ESOPHAGOGASTRODUODENOSCOPY (EGD) WITH PROPOFOL;  Surgeon: Beverley Fiedler, MD;  Location: [facility] ENDOSCOPY;  Service: Gastroenterology;  Laterality: N/A;    Social History Timothy [name]  reports that he has never smoked. He has never used smokeless tobacco. He reports that he does not drink alcohol and does not use drugs.  family history includes Hypertension in his sister.  Allergies  Allergen Reactions   Nsaids     Patient denies any allergy to NSAIDs (particular Advil) He takes this medication frequently without any adverse side effects        PHYSICAL EXAMINATION: Vital signs: BP (!) 142/84   Pulse (!) 45   Temp (!) 97.5 F (36.4 C) (Temporal)   Resp 11   Ht  6\' 1"  (1.854 m)   Wt 88 kg   SpO2 100%   BMI 25.60 kg/m  General: Well-developed, well-nourished, no acute distress HEENT: Sclerae are anicteric, conjunctiva pink. Oral mucosa intact Lungs: Clear Heart: Regular Abdomen: soft, nontender, nondistended, no obvious ascites, no peritoneal signs, normal bowel sounds. No organomegaly. Extremities: No edema Psychiatric: alert and oriented x3. Cooperative     ASSESSMENT:  Cirrhosis with portal hypertension complicated by variceal bleeding   PLAN:  EGD with probable esophageal banding.The nature of the procedure, as well as the risks, benefits, and alternatives were carefully and thoroughly reviewed with the patient. Ample time for discussion and questions allowed. The patient understood, was satisfied, and agreed to proceed.

## 2022-12-07 NOTE — Telephone Encounter (Signed)
Patient called stating his chest and throat has been severely hurting when he swallows since his procedure this morning. Requesting a call back to discuss this further, please advise, thank you.

## 2022-12-07 NOTE — Transfer of Care (Signed)
Immediate Anesthesia Transfer of Care Note  Patient: Timothy [name]  Procedure(s) Performed: Procedure(s): ESOPHAGOGASTRODUODENOSCOPY (EGD) WITH PROPOFOL (N/A) ESOPHAGEAL BANDING (N/A)  Patient Location: PACU  Anesthesia Type:MAC  Level of Consciousness: Patient easily awoken, sedated, comfortable, cooperative, following commands, responds to stimulation.   Airway & Oxygen Therapy: Patient spontaneously breathing, ventilating well, oxygen via simple oxygen mask.  Post-op Assessment: Report given to PACU RN, vital signs reviewed and stable, moving all extremities.   Post vital signs: Reviewed and stable.  Complications: No apparent anesthesia complications  Last Vitals:  Vitals Value Taken Time  BP 154/94 12/07/22 0918  Temp    Pulse 56 12/07/22 0919  Resp 19 12/07/22 0919  SpO2 100 % 12/07/22 0919  Vitals shown include unvalidated device data.  Last Pain:  Vitals:   12/07/22 0749  TempSrc: Temporal  PainSc: 0-No pain         Complications: No notable events documented.

## 2022-12-07 NOTE — Telephone Encounter (Signed)
Pt states when he has an EGD with banding in the past he has felt like he has heartburn after the procedure only when he swallows. The one he had done today he reports it feel like he is having bad heartburn in his throat and chest all the time. Pt wants to know if there is anything he can take to make this feel better. Please advise.

## 2022-12-07 NOTE — Anesthesia Postprocedure Evaluation (Signed)
Anesthesia Post Note  Patient: Timothy [name]  Procedure(s) Performed: ESOPHAGOGASTRODUODENOSCOPY (EGD) WITH PROPOFOL ESOPHAGEAL BANDING     Patient location during evaluation: PACU Anesthesia Type: MAC Level of consciousness: awake Pain management: pain level controlled Vital Signs Assessment: post-procedure vital signs reviewed and stable Respiratory status: spontaneous breathing, nonlabored ventilation and respiratory function stable Cardiovascular status: stable and blood pressure returned to baseline Postop Assessment: no apparent nausea or vomiting Anesthetic complications: no   No notable events documented.  Last Vitals:  Vitals:   12/07/22 0940 12/07/22 0950  BP: (!) 158/95 (!) 154/88  Pulse: (!) 48 (!) 52  Resp: 13 (!) 21  Temp:    SpO2: 100% 100%    Last Pain:  Vitals:   12/07/22 0940  TempSrc:   PainSc: 1                  Linton Rump

## 2022-12-07 NOTE — Telephone Encounter (Signed)
Left detailed message for pt regarding Dr. Lauro Franklin recommendations. Requested he call the office back with any questions.

## 2022-12-07 NOTE — Op Note (Signed)
[facility] Patient Name: Timothy [name] Procedure Date: 12/07/2022 MRN: [id number] Attending MD: Wilhemina Bonito. Marina Goodell , MD, [id number] Date of Birth: 24-Mar-1961 CSN: [id number] Age: 62 Admit Type: Outpatient Procedure:                Upper GI endoscopy with band ligation of esophageal                            varices x 4 Indications:              For therapy of esophageal varices. 2 prior                            sessions, most recently October 20, 2022 and Nov 11, 2022 Providers:                Wilhemina Bonito. Marina Goodell, MD, Martha Clan, RN, Marge Duncans, RN, Melany Guernsey, Technician Referring MD:             Carie Caddy. Pyrtle, MD Medicines:                Monitored Anesthesia Care Complications:            No immediate complications. Estimated Blood Loss:     Estimated blood loss: none. Procedure:                Pre-Anesthesia Assessment:                           - Prior to the procedure, a History and Physical                            was performed, and patient medications and                            allergies were reviewed. The patient's tolerance of                            previous anesthesia was also reviewed. The risks                            and benefits of the procedure and the sedation                            options and risks were discussed with the patient.                            All questions were answered, and informed consent                            was obtained. Prior Anticoagulants: The patient has  taken no anticoagulant or antiplatelet agents. ASA                            Grade Assessment: III - A patient with severe                            systemic disease. After reviewing the risks and                            benefits, the patient was deemed in satisfactory                            condition to undergo the procedure.                           After  obtaining informed consent, the endoscope was                            passed under direct vision. Throughout the                            procedure, the patient's blood pressure, pulse, and                            oxygen saturations were monitored continuously. The                            [id number]-[id number] ([id number]) Olympus endoscope was introduced                            through the mouth, and advanced to the second part                            of duodenum. The upper GI endoscopy was                            accomplished without difficulty. The patient                            tolerated the procedure well. Scope In: Scope Out: Findings:      The esophagus revealed scarring from prior band placement. There was       some residual variceal remnants.      Varices were found in the lower third of the esophagus. Four bands were       successfully placed with incomplete eradication of varices. There was no       bleeding during the procedure.      The stomach revealed mild portal hypertensive gastropathy.      The examined duodenum was normal.      The cardia and gastric fundus were normal on retroflexion. Impression:               1. Status post EGD with variceal banding x 4  2. Mild portal hypertensive gastropathy. Moderate Sedation:      none Recommendation:           - Patient has a contact number available for                            emergencies. The signs and symptoms of potential                            delayed complications were discussed with the                            patient. Return to normal activities tomorrow.                            Written discharge instructions were provided to the                            patient.                           - Resume previous diet.                           - Continue present medications.                           - Follow-up with Dr. Rhea Belton. I would recommend                             repeat EGD with possible banding in about 4 to 6                            weeks, with Dr. Rhea Belton Procedure Code(s):        --- Professional ---                           [phone], Esophagogastroduodenoscopy, flexible,                            transoral; with band ligation of esophageal/gastric                            varices Diagnosis Code(s):        --- Professional ---                           I85.00, Esophageal varices without bleeding CPT copyright 2022 American Medical Association. All rights reserved. The codes documented in this report are preliminary and upon coder review may  be revised to meet current compliance requirements. Wilhemina Bonito. Marina Goodell, MD 12/07/2022 9:23:28 AM This report has been signed electronically. Number of Addenda: 0

## 2022-12-07 NOTE — Telephone Encounter (Signed)
He should go back to the pantoprazole 40 mg twice daily before meals Over-the-counter liquid Gaviscon should help with this as well per bottle instruction This should be better in 24 to 36 hours and is a result of the banding

## 2022-12-07 NOTE — Anesthesia Procedure Notes (Signed)
Procedure Name: MAC Date/Time: 12/07/2022 8:41 AM  Performed by: Ludwig Lean, CRNAPre-anesthesia Checklist: Patient identified, Emergency Drugs available, Suction available and Patient being monitored Patient Re-evaluated:Patient Re-evaluated prior to induction Oxygen Delivery Method: Simple face mask Preoxygenation: Pre-oxygenation with 100% oxygen Placement Confirmation: positive ETCO2 and breath sounds checked- equal and bilateral

## 2022-12-07 NOTE — Discharge Instructions (Signed)
YOU HAD AN ENDOSCOPIC PROCEDURE TODAY: Refer to the procedure report and other information in the discharge instructions given to you for any specific questions about what was found during the examination. If this information does not answer your questions, please call [facility] office at [phone] to clarify.  ° °YOU SHOULD EXPECT: Some feelings of bloating in the abdomen. Passage of more gas than usual. Walking can help get rid of the air that was put into your GI tract during the procedure and reduce the bloating.  ° °DIET: Your first meal following the procedure should be a light meal and then it is ok to progress to your normal diet. A half-sandwich or bowl of soup is an example of a good first meal. Heavy or fried foods are harder to digest and may make you feel nauseous or bloated. Drink plenty of fluids but you should avoid alcoholic beverages for 24 hours. ° °ACTIVITY: Your care partner should take you home directly after the procedure. You should plan to take it easy, moving slowly for the rest of the day. You can resume normal activity the day after the procedure however YOU SHOULD NOT DRIVE, use power tools, machinery or perform tasks that involve climbing or major physical exertion for 24 hours (because of the sedation medicines used during the test).  ° °SYMPTOMS TO REPORT IMMEDIATELY: °A gastroenterologist can be reached at any hour. Please call [phone]  for any of the following symptoms:  ° °Following upper endoscopy (EGD, EUS, ERCP, esophageal dilation) °Vomiting of blood or coffee ground material  °New, significant abdominal pain  °New, significant chest pain or pain under the shoulder blades  °Painful or persistently difficult swallowing  °New shortness of breath  °Black, tarry-looking or red, bloody stools ° °FOLLOW UP:  °If any biopsies were taken you will be contacted by phone or by letter within the next 1-3 weeks. Call [phone]  if you have not heard about the biopsies in 3 weeks.    °Please also call with any specific questions about appointments or follow up tests. ° °

## 2022-12-09 ENCOUNTER — Other Ambulatory Visit: Payer: Self-pay

## 2022-12-10 ENCOUNTER — Other Ambulatory Visit: Payer: Self-pay

## 2022-12-10 ENCOUNTER — Telehealth: Payer: Self-pay

## 2022-12-10 ENCOUNTER — Encounter (HOSPITAL_COMMUNITY): Payer: Self-pay | Admitting: Internal Medicine

## 2022-12-10 DIAGNOSIS — I8511 Secondary esophageal varices with bleeding: Secondary | ICD-10-CM

## 2022-12-10 NOTE — Telephone Encounter (Signed)
-----   Message from Beverley Fiedler, MD sent at 12/07/2022 10:53 AM EDT ----- Regarding: FW: Follow-up Pt needs to be back on with me for EGD at [facility] or [facility] in 6-8 weeks Thanks JMP  ----- Message ----- From: Hilarie Fredrickson, MD Sent: 12/07/2022   9:26 AM EDT To: Beverley Fiedler, MD Subject: Follow-up                                      Good morning Vonna Kotyk. Mr. Limbrick did well. EGD with band placement x 4.  Overall things in the esophagus look better. Consider repeat EGD in about 6 weeks.  I will leave this to you. Burnett Sheng

## 2022-12-10 NOTE — Telephone Encounter (Signed)
Spoke with pt and he is aware of appt date and time, he knows to be NPO after midnight. Amb ref in epic.

## 2022-12-10 NOTE — Telephone Encounter (Signed)
Orders in epic for EGD with banding for 03/01/23.  Pt scheduled for EGD with banding at [facility] 03/01/23 at 8:15am. Pt to arrive there at 6:45am. Case#[id number]

## 2022-12-18 ENCOUNTER — Other Ambulatory Visit: Payer: Self-pay

## 2022-12-21 ENCOUNTER — Other Ambulatory Visit: Payer: Self-pay

## 2022-12-21 ENCOUNTER — Other Ambulatory Visit: Payer: Medicaid Other

## 2022-12-21 ENCOUNTER — Ambulatory Visit: Payer: Medicaid Other | Admitting: Nurse Practitioner

## 2022-12-21 ENCOUNTER — Encounter: Payer: Self-pay | Admitting: Nurse Practitioner

## 2022-12-21 VITALS — BP 118/70 | HR 58 | Ht 73.5 in | Wt 195.0 lb

## 2022-12-21 DIAGNOSIS — K7460 Unspecified cirrhosis of liver: Secondary | ICD-10-CM

## 2022-12-21 DIAGNOSIS — K7290 Hepatic failure, unspecified without coma: Secondary | ICD-10-CM

## 2022-12-21 MED ORDER — PANTOPRAZOLE SODIUM 40 MG PO TBEC
40.0000 mg | DELAYED_RELEASE_TABLET | Freq: Two times a day (BID) | ORAL | 5 refills | Status: DC
  Filled 2022-12-21: qty 60, 30d supply, fill #0

## 2022-12-21 NOTE — Patient Instructions (Addendum)
_______________________________________________________  If your blood pressure at your visit was 140/90 or greater, please contact your primary care physician to follow up on this.  _______________________________________________________  If you are age 63 or older, your body mass index should be between 23-30. Your Body mass index is 25.38 kg/m. If this is out of the aforementioned range listed, please consider follow up with your Primary Care Provider.  If you are age 41 or younger, your body mass index should be between 19-25. Your Body mass index is 25.38 kg/m. If this is out of the aformentioned range listed, please consider follow up with your Primary Care Provider.   ________________________________________________________  The [place] [facility] providers would like to encourage you to use [facility] to communicate with providers for non-urgent requests or questions.  Due to long hold times on the telephone, sending your provider a message by [facility] may be a faster and more efficient way to get a response.  Please allow 48 business hours for a response.  Please remember that this is for non-urgent requests.  _______________________________________________________   Your provider has requested that you go to the basement level for lab work before leaving today. Press "B" on the elevator. The lab is located at the first door on the left as you exit the elevator.    You have been scheduled for an endoscopy. Please follow written instructions given to you at your visit today. If you use inhalers (even only as needed), please bring them with you on the day of your procedure.   Due to recent changes in healthcare laws, you may see the results of your imaging and laboratory studies on MyChart before your provider has had a chance to review them.  We understand that in some cases there may be results that are confusing or concerning to you. Not all laboratory results come back in the same time frame  and the provider may be waiting for multiple results in order to interpret others.  Please give [place] 48 hours in order for your provider to thoroughly review all the results before contacting the office for clarification of your results.   We have sent the following medications to your pharmacy for you to pick up at your convenience:  Restart your pantoprazole 40 mg one tablet twice a day   Paula,NP

## 2022-12-21 NOTE — Progress Notes (Signed)
Assessment and Plan   Primary GI: Timothy Papa, MD  / [name], MD ( hospital)    Brief Narrative:  62 y.o.  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  -Scheduled in August for 4th EGD for band ligation   -Continue Nadolol 20 mg, HR is 58 , BP 118 /70. today -Pantoprazole on home list but patient doesn't think he is taking it or even has the med at home.  Will call in Rx.  -Continue low sodium diet and daily weights. Call us for steady increase or decrease in weight -HCC screening - AFP is normal.  Abdominal US in February apparently limited in evaluation of masses given the level of heterogeneity. Need CT scan?  -Check HAV, HBV immunity status -Check BMET- on diuretics -Keep EGD appt for August -Keep appt with [facility] next month. Will await their input regarding whether to start Azathioprine  Acute blood loss anemia secondary to variceal bleed.  Hgb was 4.9,  up to 9.1 after 4 units of blood   Small duodenal polyp on EGD April 2024, not resected at the time. 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 He was lost to follow up until hospitalized 10/20/22 for a variceal bleed s/p banding. He was subsequently started on Nadolol. Following discharge he called the office with lower extremity swelling. We started diuretics and then he saw Hyacinth Meeker, PA on 5/15 . Autoimmune marker were elevated. We prescribed a short course of Prednisone and ordered a TPMT in case he was started on Azathioprine at some point. He was also referred to [facility] and has an appt there next month Since hospital discharge he has been  undergoing serial EGDs with esophageal varices banding     Interval History:  He feels okay. Bowels moving well. Having 1-2 solid BMs a day. No blood in stool or black stool. Reports a  30 pound weight loss since hospitalization in April.  Following low sodium diet. Weight at home is stable now. No swelling except in RLE but that is chronic from a previous injury. Compliant with diuretics. Pantoprazole BID is on home med list but patient doesn't think he is taking it.    Previous GI Endoscopies / Labs / Imaging    10/20/22 EGD - Large (> 5 mm) esophageal varices oozing blood, 1 with fibrin clot. Banded x 7. - Clotted blood in the gastric fundus, but no gastric varices seen. - A single duodenal polyp. Resection not attempted. No specimens collected.  11/11/22 EGD - Grade III esophageal varices. Incompletely eradicated. Banded x 7. - Normal stomach. - One small duodenal polyp. Attention on follow-up. - No specimens collected.  12/07/22 EGD Status post EGD with variceal banding x 4 2. Mild portal hypertensive gastropathy.       Latest Ref Rng & Units 11/04/2022   12:51 PM 10/21/2022    1:59 AM 10/20/2022   11:55 AM  Hepatic Function  Total Protein 6.0 - 8.3 g/dL 6.6  5.3  <5.2   Albumin 3.5 - 5.2 g/dL 2.6  2.0  <8.4   AST 0 - 37 U/L 52  54  29  ALT 0 - 53 U/L 21  34  17   Alk Phosphatase 39 - 117 U/L 57  41  21   Total Bilirubin 0.2 - 1.2 mg/dL 1.2  1.2  0.8   Bilirubin, Direct 0.0 - 0.2 mg/dL  0.2         Latest Ref Rng & Units 11/04/2022   12:51 PM 10/23/2022    1:13 AM 10/22/2022    2:00 AM  CBC  WBC 4.0 - 10.5 K/uL 4.7  7.1  9.4   Hemoglobin 13.0 - 17.0 g/dL 9.1  7.4  7.9   Hematocrit 39.0 - 52.0 % 27.6  21.5  22.9   Platelets 150.0 - 400.0 K/uL 110.0  54  59      Past Medical History:  Diagnosis Date   Autoimmune hepatitis (HCC)    Cirrhosis of liver (HCC)    Hypertension    Thrombocytopenia (HCC)     Past Surgical History:  Procedure Laterality Date   ESOPHAGEAL  BANDING  10/20/2022   Procedure: ESOPHAGEAL BANDING;  Surgeon: Beverley Fiedler, MD;  Location[facility] ENDOSCOPY;  Service: Gastroenterology;;   ESOPHAGEAL BANDING  11/11/2022   Procedure: ESOPHAGEAL BANDING;  Surgeon: Beverley Fiedler, MD;  Location: [facility] ENDOSCOPY;  Service: Gastroenterology;;   ESOPHAGEAL BANDING N/A 12/07/2022   Procedure: ESOPHAGEAL BANDING;  Surgeon: Hilarie Fredrickson, MD;  Location: [facility] ENDOSCOPY;  Service: Gastroenterology;  Laterality: N/A;   ESOPHAGOGASTRODUODENOSCOPY N/A 10/20/2022   Procedure: ESOPHAGOGASTRODUODENOSCOPY (EGD);  Surgeon: Beverley Fiedler, MD;  Location: [facility] ENDOSCOPY;  Service: Gastroenterology;  Laterality: N/A;  per ICU   ESOPHAGOGASTRODUODENOSCOPY (EGD) WITH PROPOFOL N/A 11/11/2022   Procedure: ESOPHAGOGASTRODUODENOSCOPY (EGD) WITH PROPOFOL;  Surgeon: Beverley Fiedler, MD;  Location: [facility] ENDOSCOPY;  Service: Gastroenterology;  Laterality: N/A;   ESOPHAGOGASTRODUODENOSCOPY (EGD) WITH PROPOFOL N/A 12/07/2022   Procedure: ESOPHAGOGASTRODUODENOSCOPY (EGD) WITH PROPOFOL;  Surgeon: Hilarie Fredrickson, MD;  Location: [facility] ENDOSCOPY;  Service: Gastroenterology;  Laterality: N/A;    Current Medications, Allergies, Family History and Social History were reviewed in [employer] record.     Current Outpatient Medications  Medication Sig Dispense Refill   docusate sodium (COLACE) 100 MG capsule Take 1 capsule (100 mg total) by mouth 2 (two) times daily as needed for mild constipation. 10 capsule 0   furosemide (LASIX) 40 MG tablet Take 1 tablet (40 mg total) by mouth daily. 30 tablet 2   nadolol (CORGARD) 20 MG tablet Take 1 tablet (20 mg total) by mouth daily. 30 tablet 3   pantoprazole (PROTONIX) 40 MG tablet Take 1 tablet (40 mg total) by mouth 2 (two) times daily before a meal. (Patient not taking: Reported on 12/02/2022) 60 tablet 1   polyethylene glycol (MIRALAX / GLYCOLAX) 17 g packet Take 17 g by mouth daily as needed for moderate constipation. 14 each 0    spironolactone (ALDACTONE) 100 MG tablet Take 1 tablet (100 mg total) by mouth daily. 30 tablet 11   No current facility-administered medications for this visit.    Review of Systems: No chest pain. No shortness of breath. No urinary complaints.    Physical Exam  Wt Readings from Last 3 Encounters:  12/07/22 194 lb (88 kg)  11/18/22 212 lb 6.4 oz (96.3 kg)  11/11/22 220 lb (99.8 kg)    BP 118/70   Pulse (!) 58   Ht 6' 1.5" (1.867 m)   Wt 195 lb (88.5 kg)   BMI 25.38 kg/m  Constitutional:  Pleasant, generally well appearing  male in no acute distress. Psychiatric: Normal mood and affect. Behavior is normal. EENT: Pupils normal.  Conjunctivae are normal. No scleral icterus. Neck supple.  Cardiovascular: Normal rate, regular rhythm.  Pulmonary/chest: Effort normal and breath sounds normal. No wheezing, rales or rhonchi. Abdominal: Soft, nondistended, nontender. Bowel sounds active throughout. There are no masses palpable. No hepatomegaly. No obvious ascites Neurological: Alert and oriented to person place and time.  Extremities: 1+ RLE edema. No LLE edema.  Skin: Skin is warm and dry. No rashes noted.  Willette Cluster, NP  12/21/2022, 10:40 AM

## 2022-12-23 ENCOUNTER — Other Ambulatory Visit: Payer: Self-pay

## 2022-12-23 LAB — HEPATITIS B SURFACE ANTIBODY,QUALITATIVE: Hep B S Ab: NONREACTIVE

## 2022-12-23 LAB — HEPATITIS A ANTIBODY, TOTAL: Hepatitis A AB,Total: NONREACTIVE

## 2022-12-23 LAB — HEPATITIS B SURFACE ANTIGEN: Hepatitis B Surface Ag: NONREACTIVE

## 2022-12-23 NOTE — Progress Notes (Signed)
Addendum: Reviewed and agree with assessment and management plan. Brittie Whisnant M, MD  

## 2022-12-30 ENCOUNTER — Other Ambulatory Visit: Payer: Self-pay

## 2022-12-30 DIAGNOSIS — Z23 Encounter for immunization: Secondary | ICD-10-CM

## 2022-12-30 DIAGNOSIS — K754 Autoimmune hepatitis: Secondary | ICD-10-CM

## 2022-12-30 DIAGNOSIS — K7290 Hepatic failure, unspecified without coma: Secondary | ICD-10-CM

## 2022-12-31 ENCOUNTER — Other Ambulatory Visit: Payer: Self-pay

## 2023-01-05 ENCOUNTER — Encounter: Payer: Self-pay | Admitting: Critical Care Medicine

## 2023-01-05 ENCOUNTER — Ambulatory Visit: Payer: MEDICAID | Attending: Critical Care Medicine | Admitting: Critical Care Medicine

## 2023-01-05 ENCOUNTER — Ambulatory Visit (INDEPENDENT_AMBULATORY_CARE_PROVIDER_SITE_OTHER): Payer: Medicaid Other | Admitting: Nurse Practitioner

## 2023-01-05 ENCOUNTER — Other Ambulatory Visit: Payer: Self-pay

## 2023-01-05 VITALS — BP 131/86 | HR 60 | Wt 199.4 lb

## 2023-01-05 DIAGNOSIS — I10 Essential (primary) hypertension: Secondary | ICD-10-CM | POA: Diagnosis not present

## 2023-01-05 DIAGNOSIS — K766 Portal hypertension: Secondary | ICD-10-CM | POA: Diagnosis not present

## 2023-01-05 DIAGNOSIS — K7460 Unspecified cirrhosis of liver: Secondary | ICD-10-CM | POA: Diagnosis not present

## 2023-01-05 DIAGNOSIS — I8500 Esophageal varices without bleeding: Secondary | ICD-10-CM

## 2023-01-05 DIAGNOSIS — Z8619 Personal history of other infectious and parasitic diseases: Secondary | ICD-10-CM

## 2023-01-05 DIAGNOSIS — D696 Thrombocytopenia, unspecified: Secondary | ICD-10-CM | POA: Diagnosis not present

## 2023-01-05 DIAGNOSIS — Z23 Encounter for immunization: Secondary | ICD-10-CM | POA: Diagnosis not present

## 2023-01-05 MED ORDER — NADOLOL 20 MG PO TABS
20.0000 mg | ORAL_TABLET | Freq: Every day | ORAL | 3 refills | Status: DC
  Filled 2023-01-05: qty 90, 90d supply, fill #0
  Filled 2023-01-05: qty 120, 120d supply, fill #0
  Filled 2023-01-28: qty 30, 30d supply, fill #0
  Filled 2023-01-29 (×2): qty 30, 30d supply, fill #1
  Filled ????-??-??: fill #0

## 2023-01-05 MED ORDER — FUROSEMIDE 40 MG PO TABS
40.0000 mg | ORAL_TABLET | Freq: Every day | ORAL | 2 refills | Status: DC
  Filled 2023-01-05 (×2): qty 90, 90d supply, fill #0
  Filled 2023-04-06: qty 90, 90d supply, fill #1

## 2023-01-05 MED ORDER — PANTOPRAZOLE SODIUM 40 MG PO TBEC
40.0000 mg | DELAYED_RELEASE_TABLET | Freq: Two times a day (BID) | ORAL | 5 refills | Status: DC
  Filled 2023-01-05: qty 90, 45d supply, fill #0
  Filled 2023-01-05: qty 60, 30d supply, fill #0

## 2023-01-05 MED ORDER — SPIRONOLACTONE 100 MG PO TABS
100.0000 mg | ORAL_TABLET | Freq: Every day | ORAL | 11 refills | Status: DC
  Filled 2023-01-05 (×2): qty 90, 90d supply, fill #0
  Filled 2023-01-28: qty 30, 30d supply, fill #0
  Filled 2023-03-16: qty 30, 30d supply, fill #1
  Filled 2023-04-12: qty 30, 30d supply, fill #2
  Filled 2023-05-18: qty 30, 30d supply, fill #3
  Filled 2023-08-05: qty 30, 30d supply, fill #4
  Filled 2023-09-22: qty 30, 30d supply, fill #5
  Filled 2023-10-21 (×2): qty 30, 30d supply, fill #6
  Filled 2023-12-02: qty 30, 30d supply, fill #7
  Filled 2023-12-31: qty 30, 30d supply, fill #8
  Filled ????-??-??: fill #0

## 2023-01-05 NOTE — Patient Instructions (Signed)
No change in medication All medication refills sent downstairs No labs today Keep appt with Dr Rhea Belton Keep liver specialist appt Go to DSS to apply for disability have them send [place] the form for records Return Dr Delford Field 2 months

## 2023-01-05 NOTE — Assessment & Plan Note (Signed)
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

## 2023-01-05 NOTE — Progress Notes (Signed)
Established Patient Office Visit  Subjective:  Patient ID: Timothy [name], male    DOB: [birth year]  Age: 62 y.o. MRN: [id number]  CC:  Disability assessment follow-up liver disease and hypertension  HPI 06/2021 Timothy [name] presents for primary care follow-up.  Patient has not been seen since a primary care visit in January 2022.  He missed his follow-up visit in April.  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  He does not drink alcohol.  Patient states has had increasing abdominal girth and swelling.  Patient states he denies any nausea or vomiting.  Note on arrival blood pressure is elevated 180/117.  He is only been taking Bystolic 10 mg daily.  He works Economist at night.  He is not compliant with a low-salt diet at this time.  The patient declined a flu vaccine and will give consideration to a pneumonia vaccine  7/25 Patient seen in return follow-up as not been seen since December.  Patient had extremely high blood pressures in the past but since starting Aldactone and Bystolic blood pressures have improved.  He had difficulty with fluid retention on amlodipine and significant diarrhea on valsartan and these medicines were stopped on the patient's own.  He has an automated blood pressure machine at home and he is getting readings anywhere from 117/74 to 116/72.  Patient was seen by the CPP in June as noted below. 12/2021 cpp Current antihypertensives: nebivolol 10 mg (cutting tablet in half), spironolactone 50 mg daily; self discontinued valsartan due to diarrhea.    Patient has an automated upper arm home BP machine.   Current blood pressure readings: 110-120s/80s at home.      Patient denies hypotensive signs and symptoms including dizziness, lightheadedness.  Patient denies hypertensive symptoms including headache, chest pain, shortness of breath.   Patient denies side effects related to  nebivolol or spironolactone    Assessment/Plan: - Currently controlled - - Reviewed goal blood pressure <130/80 - Reviewed to check blood pressure daily, document, and provide at next provider visit     Patient notes he is uninsured, lost [employer] coverage. He is interested in dental work - referral placed for [employer]. Also will notify PCP  Patient currently is uninsured and is interested in dental work he did get some work already obtained.  Patient knows he needs to get the orange card to see another dentist.  On arrival blood pressure today is 136/89 but blood pressures at home generally tend to run lower.  He has some pain in the right knee he uses topical Voltaren gel for this.  He has a rash on his back which causes itching.  He has right upper extremity bruising that was spontaneous.  Patient does have history of thrombocytopenia and liver disease.  Patient needs follow-up lab data at this visit.  01/05/23 Patient not seen by me since July of last year was seen by PA Mcclung in October.  Patient was admitted in April for significant gastrointestinal bleeding.  He had banding done at that time due to portal hypertension.  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  He does not drink alcohol.  Patient comes in today somewhat frustrated that he had a hard time getting back into our clinic.  Also frustrated at the pharmacy will not answer phones and refill his medications. Patient had subsequent banding by gastroenterology in May and June of this year and a repeat  assessment in August of this year.  He has had no further gastrointestinal bleeding. The patient has an appointment with the liver transplant center of [facility]  Past Medical History:  Diagnosis Date   Autoimmune hepatitis (HCC)    Cirrhosis of liver (HCC)    Hypertension    Thrombocytopenia (HCC)     Past Surgical History:  Procedure Laterality Date   ESOPHAGEAL BANDING  10/20/2022   Procedure:  ESOPHAGEAL BANDING;  Surgeon: Beverley Fiedler, MD;  Location: [facility] ENDOSCOPY;  Service: Gastroenterology;;   ESOPHAGEAL BANDING  11/11/2022   Procedure: ESOPHAGEAL BANDING;  Surgeon: Beverley Fiedler, MD;  Location: [facility] ENDOSCOPY;  Service: Gastroenterology;;   ESOPHAGEAL BANDING N/A 12/07/2022   Procedure: ESOPHAGEAL BANDING;  Surgeon: Hilarie Fredrickson, MD;  Location: [facility] ENDOSCOPY;  Service: Gastroenterology;  Laterality: N/A;   ESOPHAGOGASTRODUODENOSCOPY N/A 10/20/2022   Procedure: ESOPHAGOGASTRODUODENOSCOPY (EGD);  Surgeon: Beverley Fiedler, MD;  Location: [facility] ENDOSCOPY;  Service: Gastroenterology;  Laterality: N/A;  per ICU   ESOPHAGOGASTRODUODENOSCOPY (EGD) WITH PROPOFOL N/A 11/11/2022   Procedure: ESOPHAGOGASTRODUODENOSCOPY (EGD) WITH PROPOFOL;  Surgeon: Beverley Fiedler, MD;  Location: [facility] ENDOSCOPY;  Service: Gastroenterology;  Laterality: N/A;   ESOPHAGOGASTRODUODENOSCOPY (EGD) WITH PROPOFOL N/A 12/07/2022   Procedure: ESOPHAGOGASTRODUODENOSCOPY (EGD) WITH PROPOFOL;  Surgeon: Hilarie Fredrickson, MD;  Location: [facility] ENDOSCOPY;  Service: Gastroenterology;  Laterality: N/A;    Family History  Problem Relation Age of Onset   Hypertension Sister     Social History   Socioeconomic History   Marital status: Single    Spouse name: Not on file   Number of children: 0   Years of education: Not on file   Highest education level: Not on file  Occupational History   Occupation: retired  Tobacco Use   Smoking status: Never   Smokeless tobacco: Never  Vaping Use   Vaping Use: Never used  Substance and Sexual Activity   Alcohol use: No    Alcohol/week: 0.0 standard drinks of alcohol   Drug use: No   Sexual activity: Yes  Other Topics Concern   Not on file  Social History Narrative   Not on file   Social Determinants of Health   Financial Resource Strain: Not on file  Food Insecurity: Not on file  Transportation Needs: Not on file  Physical Activity: Not on file  Stress: Not on file  Social Connections: Not  on file  Intimate Partner Violence: Not on file    Outpatient Medications Prior to Visit  Medication Sig Dispense Refill   docusate sodium (COLACE) 100 MG capsule Take 1 capsule (100 mg total) by mouth 2 (two) times daily as needed for mild constipation. 10 capsule 0   polyethylene glycol (MIRALAX / GLYCOLAX) 17 g packet Take 17 g by mouth daily as needed for moderate constipation. 14 each 0   furosemide (LASIX) 40 MG tablet Take 1 tablet (40 mg total) by mouth daily. 30 tablet 2   nadolol (CORGARD) 20 MG tablet Take 1 tablet (20 mg total) by mouth daily. 30 tablet 3   pantoprazole (PROTONIX) 40 MG tablet Take 1 tablet (40 mg total) by mouth 2 (two) times daily before a meal. 60 tablet 5   spironolactone (ALDACTONE) 100 MG tablet Take 1 tablet (100 mg total) by mouth daily. 30 tablet 11   No facility-administered medications prior to visit.    Allergies  Allergen Reactions   Nsaids     Patient denies any allergy to NSAIDs (particular Advil) He takes this medication  frequently without any adverse side effects     ROS Review of Systems  Constitutional: Negative.  Negative for chills, diaphoresis and fever.  HENT: Negative.  Negative for congestion, ear pain, hearing loss, nosebleeds, postnasal drip, rhinorrhea, sinus pressure, sore throat, tinnitus, trouble swallowing and voice change.   Eyes: Negative.  Negative for photophobia, redness and visual disturbance.  Respiratory: Negative.  Negative for apnea, cough, choking, chest tightness, shortness of breath, wheezing and stridor.   Cardiovascular:  Negative for chest pain, palpitations and leg swelling.  Gastrointestinal:  Negative for abdominal distention, abdominal pain, blood in stool, constipation, diarrhea, nausea and vomiting.  Endocrine: Negative for polydipsia.  Genitourinary: Negative.  Negative for difficulty urinating, dysuria, flank pain, frequency, hematuria and urgency.  Musculoskeletal: Negative.  Negative for  arthralgias, back pain, myalgias and neck pain.       R knee pain  Skin: Negative.  Negative for rash.  Allergic/Immunologic: Negative.  Negative for environmental allergies and food allergies.  Neurological:  Negative for dizziness, tremors, seizures, syncope, weakness and headaches.  Hematological:  Negative for adenopathy. Bruises/bleeds easily.  Psychiatric/Behavioral: Negative.  Negative for agitation, hallucinations, self-injury, sleep disturbance and suicidal ideas. The patient is not nervous/anxious.       Objective:    Physical Exam Vitals reviewed.  Constitutional:      Appearance: Normal appearance. He is well-developed. He is obese. He is not diaphoretic.  HENT:     Head: Normocephalic and atraumatic.     Nose: Nose normal. No nasal deformity, septal deviation, mucosal edema or rhinorrhea.     Right Sinus: No maxillary sinus tenderness or frontal sinus tenderness.     Left Sinus: No maxillary sinus tenderness or frontal sinus tenderness.     Mouth/Throat:     Mouth: Mucous membranes are moist.     Pharynx: Oropharynx is clear. No oropharyngeal exudate.  Eyes:     General: No scleral icterus.    Conjunctiva/sclera: Conjunctivae normal.     Pupils: Pupils are equal, round, and reactive to light.  Neck:     Thyroid: No thyromegaly.     Vascular: No carotid bruit or JVD.     Trachea: Trachea normal. No tracheal tenderness or tracheal deviation.  Cardiovascular:     Rate and Rhythm: Normal rate and regular rhythm.     Chest Wall: PMI is not displaced.     Pulses: Normal pulses. No decreased pulses.     Heart sounds: Normal heart sounds, S1 normal and S2 normal. Heart sounds not distant. No murmur heard.    No systolic murmur is present.     No diastolic murmur is present.     No friction rub. No gallop. No S3 or S4 sounds.  Pulmonary:     Effort: Pulmonary effort is normal. No tachypnea, accessory muscle usage or respiratory distress.     Breath sounds: Normal breath  sounds. No stridor. No decreased breath sounds, wheezing, rhonchi or rales.  Chest:     Chest wall: No tenderness.  Abdominal:     General: Bowel sounds are normal. There is distension.     Palpations: Abdomen is soft. Abdomen is not rigid.     Tenderness: There is no abdominal tenderness. There is no guarding or rebound.     Comments: Significant fluid wave felt compatible with ascites but not severe  Musculoskeletal:        General: Normal range of motion.     Cervical back: Normal range of motion and neck supple.  No edema, erythema or rigidity. No muscular tenderness. Normal range of motion.     Right lower leg: Edema present.     Left lower leg: Edema present.  Lymphadenopathy:     Head:     Right side of head: No submental or submandibular adenopathy.     Left side of head: No submental or submandibular adenopathy.     Cervical: No cervical adenopathy.  Skin:    General: Skin is warm and dry.     Coloration: Skin is not pale.     Findings: No bruising or rash.     Nails: There is no clubbing.     Comments: No spider angiomas  Neurological:     Mental Status: He is alert and oriented to person, place, and time.     Sensory: No sensory deficit.  Psychiatric:        Mood and Affect: Mood normal.        Speech: Speech normal.        Behavior: Behavior normal.        Thought Content: Thought content normal.        Judgment: Judgment normal.     BP 131/86 (BP Location: Right Arm, Patient Position: Sitting)   Pulse 60   Wt 199 lb 6.4 oz (90.4 kg)   SpO2 100%   BMI 25.95 kg/m  Wt Readings from Last 3 Encounters:  01/05/23 199 lb 6.4 oz (90.4 kg)  12/21/22 195 lb (88.5 kg)  12/07/22 194 lb (88 kg)     Health Maintenance Due  Topic Date Due   DTaP/Tdap/Td (1 - Tdap) Never done   Zoster Vaccines- Shingrix (1 of 2) Never done    There are no preventive care reminders to display for this patient.  Lab Results  Component Value Date   TSH 1.940 06/10/2021   Lab  Results  Component Value Date   WBC 4.7 11/04/2022   HGB 9.1 (L) 11/04/2022   HCT 27.6 (L) 11/04/2022   MCV 88.0 11/04/2022   PLT 110.0 (L) 11/04/2022   Lab Results  Component Value Date   NA 136 11/18/2022   K 4.3 11/18/2022   CHLORIDE 111 (H) 01/27/2016   CO2 28 11/18/2022   GLUCOSE 99 11/18/2022   BUN 14 11/18/2022   CREATININE 1.15 11/18/2022   BILITOT 1.2 11/04/2022   ALKPHOS 57 11/04/2022   AST 52 (H) 11/04/2022   ALT 21 11/04/2022   PROT 6.6 11/04/2022   ALBUMIN 2.6 (L) 11/04/2022   CALCIUM 8.8 11/18/2022   ANIONGAP 3 (L) 10/24/2022   EGFR 67 04/29/2022   GFR 68.08 11/18/2022   Lab Results  Component Value Date   CHOL 159 01/27/2022   Lab Results  Component Value Date   HDL 62 01/27/2022   Lab Results  Component Value Date   LDLCALC 86 01/27/2022   Lab Results  Component Value Date   TRIG 50 01/27/2022   Lab Results  Component Value Date   CHOLHDL 2.6 01/27/2022   Lab Results  Component Value Date   HGBA1C 5.9 (H) 06/11/2020      Assessment & Plan:   Problem List Items Addressed This Visit       Cardiovascular and Mediastinum   Essential hypertension    Continue nadolol furosemide and spironolactone for now      Relevant Medications   furosemide (LASIX) 40 MG tablet   nadolol (CORGARD) 20 MG tablet   spironolactone (ALDACTONE) 100 MG tablet   Portal hypertension  with esophageal varices (HCC) - Primary    Improved with banding and treatment with beta-blocker and diuretic  Follow-up per gastroenterology  Patient has a follow-up in the liver clinic at [facility] as well      Relevant Medications   furosemide (LASIX) 40 MG tablet   nadolol (CORGARD) 20 MG tablet   spironolactone (ALDACTONE) 100 MG tablet     Digestive   Liver cirrhosis (HCC)    Has appointment [facility] transplant clinic further evaluation by gastroenterology as well continue with spironolactone furosemide and Corgard  I addressed the issues with pharmacy today patient  will receive medication        History of hepatitis C    Currently quiescent this was the cause of liver cirrhosis        Hematopoietic and Hemostatic   Thrombocytopenia (HCC)    Improving over time      Meds ordered this encounter  Medications   furosemide (LASIX) 40 MG tablet    Sig: Take 1 tablet (40 mg total) by mouth daily.    Dispense:  90 tablet    Refill:  2   nadolol (CORGARD) 20 MG tablet    Sig: Take 1 tablet (20 mg total) by mouth daily.    Dispense:  120 tablet    Refill:  3   pantoprazole (PROTONIX) 40 MG tablet    Sig: Take 1 tablet (40 mg total) by mouth 2 (two) times daily before a meal.    Dispense:  90 tablet    Refill:  5   spironolactone (ALDACTONE) 100 MG tablet    Sig: Take 1 tablet (100 mg total) by mouth daily.    Dispense:  90 tablet    Refill:  11  30 minutes spent extra time needing making multiple assessments and service recovery Follow-up: Return in about 2 months (around 03/08/2023) for primary care follow up, chronic conditions.    Shan Levans, MD

## 2023-01-05 NOTE — Assessment & Plan Note (Signed)
Has appointment [facility] transplant clinic further evaluation by gastroenterology as well continue with spironolactone furosemide and Corgard  I addressed the issues with pharmacy today patient will receive medication

## 2023-01-05 NOTE — Assessment & Plan Note (Signed)
Continue nadolol furosemide and spironolactone for now

## 2023-01-05 NOTE — Assessment & Plan Note (Signed)
Hepatitis C currently quiescent

## 2023-01-05 NOTE — Assessment & Plan Note (Signed)
Improved with banding and treatment with beta-blocker and diuretic  Follow-up per gastroenterology  Patient has a follow-up in the liver clinic at [facility] as well

## 2023-01-05 NOTE — Assessment & Plan Note (Signed)
Improving over time

## 2023-01-06 ENCOUNTER — Other Ambulatory Visit: Payer: Self-pay

## 2023-01-19 ENCOUNTER — Other Ambulatory Visit: Payer: Self-pay | Admitting: Internal Medicine

## 2023-01-19 ENCOUNTER — Other Ambulatory Visit: Payer: Self-pay | Admitting: Ophthalmology

## 2023-01-19 DIAGNOSIS — I8500 Esophageal varices without bleeding: Secondary | ICD-10-CM

## 2023-01-19 DIAGNOSIS — K7460 Unspecified cirrhosis of liver: Secondary | ICD-10-CM | POA: Diagnosis not present

## 2023-01-19 DIAGNOSIS — R188 Other ascites: Secondary | ICD-10-CM | POA: Diagnosis not present

## 2023-01-19 DIAGNOSIS — M6284 Sarcopenia: Secondary | ICD-10-CM

## 2023-01-19 DIAGNOSIS — K759 Inflammatory liver disease, unspecified: Secondary | ICD-10-CM

## 2023-01-19 DIAGNOSIS — K754 Autoimmune hepatitis: Secondary | ICD-10-CM | POA: Diagnosis not present

## 2023-01-20 ENCOUNTER — Other Ambulatory Visit: Payer: Self-pay

## 2023-01-20 MED ORDER — PREDNISONE 5 MG PO TABS
10.0000 mg | ORAL_TABLET | Freq: Every day | ORAL | 0 refills | Status: DC
  Filled 2023-01-20: qty 60, 30d supply, fill #0

## 2023-01-20 MED ORDER — AZATHIOPRINE 50 MG PO TABS
50.0000 mg | ORAL_TABLET | Freq: Every day | ORAL | 11 refills | Status: DC
  Filled 2023-01-20: qty 30, 30d supply, fill #0
  Filled 2023-02-17: qty 30, 30d supply, fill #1
  Filled 2023-03-16: qty 30, 30d supply, fill #2
  Filled 2023-04-19: qty 30, 30d supply, fill #3
  Filled 2023-05-18: qty 30, 30d supply, fill #4
  Filled 2023-06-15: qty 30, 30d supply, fill #5
  Filled 2023-07-08: qty 30, 30d supply, fill #6

## 2023-01-28 ENCOUNTER — Other Ambulatory Visit: Payer: Self-pay

## 2023-01-28 ENCOUNTER — Telehealth: Payer: Self-pay | Admitting: Critical Care Medicine

## 2023-01-28 NOTE — Telephone Encounter (Signed)
Copied from CRM [id number]. Topic: General - Other >> Jan 28, 2023 10:23 AM Dondra Prader A wrote: Reason for CRM: Pt is wanting to let his PCP know that his liver specialist Dr. Hamilton Capri added two medications for him to start taking.  azaTHIOprine (IMURAN) 50 MG tablet predniSONE (DELTASONE) 5 MG tablet

## 2023-01-29 ENCOUNTER — Other Ambulatory Visit: Payer: Self-pay

## 2023-02-01 ENCOUNTER — Other Ambulatory Visit: Payer: Self-pay

## 2023-02-04 ENCOUNTER — Other Ambulatory Visit: Payer: Self-pay

## 2023-02-08 ENCOUNTER — Ambulatory Visit: Payer: Medicaid Other

## 2023-02-09 ENCOUNTER — Ambulatory Visit (INDEPENDENT_AMBULATORY_CARE_PROVIDER_SITE_OTHER): Payer: Medicaid Other | Admitting: Internal Medicine

## 2023-02-09 ENCOUNTER — Other Ambulatory Visit: Payer: Medicaid Other

## 2023-02-09 DIAGNOSIS — K7460 Unspecified cirrhosis of liver: Secondary | ICD-10-CM

## 2023-02-09 DIAGNOSIS — K754 Autoimmune hepatitis: Secondary | ICD-10-CM

## 2023-02-09 DIAGNOSIS — I8511 Secondary esophageal varices with bleeding: Secondary | ICD-10-CM

## 2023-02-09 DIAGNOSIS — Z23 Encounter for immunization: Secondary | ICD-10-CM

## 2023-02-09 DIAGNOSIS — K7290 Hepatic failure, unspecified without coma: Secondary | ICD-10-CM

## 2023-02-10 ENCOUNTER — Ambulatory Visit: Admission: RE | Admit: 2023-02-10 | Payer: Medicaid Other | Source: Ambulatory Visit

## 2023-02-10 DIAGNOSIS — M6284 Sarcopenia: Secondary | ICD-10-CM

## 2023-02-10 DIAGNOSIS — K759 Inflammatory liver disease, unspecified: Secondary | ICD-10-CM | POA: Diagnosis not present

## 2023-02-10 DIAGNOSIS — I8500 Esophageal varices without bleeding: Secondary | ICD-10-CM | POA: Diagnosis not present

## 2023-02-10 DIAGNOSIS — K7460 Unspecified cirrhosis of liver: Secondary | ICD-10-CM

## 2023-02-10 MED ORDER — IOPAMIDOL (ISOVUE-300) INJECTION 61%
100.0000 mL | Freq: Once | INTRAVENOUS | Status: AC | PRN
  Administered 2023-02-10: 100 mL via INTRAVENOUS

## 2023-02-10 NOTE — Progress Notes (Signed)
Vaccination with RN visit

## 2023-02-10 NOTE — H&P (View-Only) (Signed)
 Vaccination with RN visit

## 2023-02-16 ENCOUNTER — Other Ambulatory Visit: Payer: Self-pay

## 2023-02-16 ENCOUNTER — Telehealth: Payer: Self-pay

## 2023-02-16 DIAGNOSIS — R188 Other ascites: Secondary | ICD-10-CM | POA: Diagnosis not present

## 2023-02-16 DIAGNOSIS — K754 Autoimmune hepatitis: Secondary | ICD-10-CM | POA: Diagnosis not present

## 2023-02-16 DIAGNOSIS — M6284 Sarcopenia: Secondary | ICD-10-CM | POA: Diagnosis not present

## 2023-02-16 DIAGNOSIS — I8500 Esophageal varices without bleeding: Secondary | ICD-10-CM | POA: Diagnosis not present

## 2023-02-16 DIAGNOSIS — K7460 Unspecified cirrhosis of liver: Secondary | ICD-10-CM | POA: Diagnosis not present

## 2023-02-16 DIAGNOSIS — R269 Unspecified abnormalities of gait and mobility: Secondary | ICD-10-CM

## 2023-02-16 MED ORDER — CARVEDILOL 3.125 MG PO TABS
ORAL_TABLET | ORAL | 3 refills | Status: DC
  Filled 2023-02-16: qty 180, 90d supply, fill #0
  Filled 2023-05-18: qty 180, 90d supply, fill #1
  Filled 2023-08-11: qty 180, 90d supply, fill #2
  Filled 2023-11-15: qty 180, 90d supply, fill #3

## 2023-02-16 NOTE — Telephone Encounter (Signed)
Rx for 4 prong cane ordered  give to [facility]

## 2023-02-16 NOTE — Addendum Note (Signed)
Addended by: Storm Frisk on: 02/16/2023 04:53 PM   Modules accepted: Orders

## 2023-02-16 NOTE — Telephone Encounter (Signed)
Copied from CRM [id number]. Topic: General - Other >> Feb 16, 2023 11:28 AM Phill Myron wrote: Mr. Drudge is requesting a cain with four legs to help with balance.  Also, Patient would like doctor to be aware that his liver doctor added another medication.

## 2023-02-17 ENCOUNTER — Other Ambulatory Visit: Payer: Self-pay

## 2023-02-17 NOTE — Telephone Encounter (Signed)
Noted  

## 2023-02-19 ENCOUNTER — Other Ambulatory Visit: Payer: Self-pay

## 2023-02-19 ENCOUNTER — Encounter (HOSPITAL_COMMUNITY): Payer: Self-pay | Admitting: Internal Medicine

## 2023-02-22 ENCOUNTER — Telehealth: Payer: Self-pay | Admitting: Critical Care Medicine

## 2023-02-22 ENCOUNTER — Telehealth: Payer: Self-pay

## 2023-02-22 NOTE — Telephone Encounter (Signed)
Copied from CRM [id number]. Topic: General - Other >> Feb 19, 2023  4:45 PM Timothy [name] F wrote: Reason for CRM: Pt is calling in because he wants to know if the cane that was ordered for him will come to his home or would he need to pick it up from the office. Please advise.

## 2023-02-22 NOTE — Telephone Encounter (Signed)
Pt is calling requesting an update on his cane. Please advise.

## 2023-02-22 NOTE — Telephone Encounter (Signed)
Copied from CRM [id number]. Topic: General - Inquiry >> Feb 22, 2023  9:17 AM De Blanch wrote: Reason for CRM: Pt stated he needs some information in order to get his disability and needs to know whether or not he was diagnosed with liver disease. Pt is requesting a callback.  Please advise.

## 2023-02-23 ENCOUNTER — Telehealth: Payer: Self-pay

## 2023-02-23 NOTE — Telephone Encounter (Signed)
I called and spoke to the patient.  This is documented in another telephone encounter today.

## 2023-02-23 NOTE — Telephone Encounter (Signed)
Order just received for the cane. I spoke to the patient and he has no preference for DME companies.  I told him I will send the referral to [employer] first and they will verify insurance coverage.   I also informed him that he can let the company that will be providing the cane know if he wants it delivered or if he will pick it up at their office. I told him that it will not be delivered to [facility].  He said he understood and I told him to call me with any questions.

## 2023-02-23 NOTE — Telephone Encounter (Signed)
Noted  

## 2023-02-24 DIAGNOSIS — K754 Autoimmune hepatitis: Secondary | ICD-10-CM | POA: Diagnosis not present

## 2023-02-24 DIAGNOSIS — I10 Essential (primary) hypertension: Secondary | ICD-10-CM | POA: Diagnosis not present

## 2023-02-25 ENCOUNTER — Telehealth: Payer: Self-pay | Admitting: Internal Medicine

## 2023-02-25 NOTE — Telephone Encounter (Signed)
Per Venita Sheffield, they spoke to the patient and will drop ship the cane to him.

## 2023-02-25 NOTE — Telephone Encounter (Signed)
Hey Timothy [name], this pt's insurance is wanting to know why this pt's procedure is being done at the hospital instead of an ambulatory surgical center.

## 2023-02-25 NOTE — Telephone Encounter (Signed)
Called patient and he stated that they should be faxing paperwork over to fill out

## 2023-02-25 NOTE — Telephone Encounter (Signed)
Pts last EGD was done at the hospital. Pt has Cirrhosis with esophageal varices which require banding. Banding cannot be done in the [facility] because bleeding is a possibility. Banding of esophageal varices is only done at the hospital due to patient safety.

## 2023-03-01 ENCOUNTER — Ambulatory Visit (HOSPITAL_COMMUNITY): Payer: Medicaid Other | Admitting: Certified Registered Nurse Anesthetist

## 2023-03-01 ENCOUNTER — Ambulatory Visit (HOSPITAL_BASED_OUTPATIENT_CLINIC_OR_DEPARTMENT_OTHER): Payer: Medicaid Other | Admitting: Certified Registered Nurse Anesthetist

## 2023-03-01 ENCOUNTER — Other Ambulatory Visit: Payer: Self-pay

## 2023-03-01 ENCOUNTER — Encounter (HOSPITAL_COMMUNITY): Payer: Self-pay | Admitting: Internal Medicine

## 2023-03-01 ENCOUNTER — Ambulatory Visit (HOSPITAL_COMMUNITY)
Admission: RE | Admit: 2023-03-01 | Discharge: 2023-03-01 | Disposition: A | Discharge status: Home / Self Care | Payer: Medicaid Other | Attending: Internal Medicine | Admitting: Internal Medicine

## 2023-03-01 ENCOUNTER — Encounter (HOSPITAL_COMMUNITY)
Admission: RE | Disposition: A | Discharge status: Home / Self Care | Payer: Self-pay | Source: Home / Self Care | Attending: Internal Medicine

## 2023-03-01 DIAGNOSIS — I8510 Secondary esophageal varices without bleeding: Secondary | ICD-10-CM

## 2023-03-01 DIAGNOSIS — K2289 Other specified disease of esophagus: Secondary | ICD-10-CM | POA: Diagnosis not present

## 2023-03-01 DIAGNOSIS — K766 Portal hypertension: Secondary | ICD-10-CM

## 2023-03-01 DIAGNOSIS — I10 Essential (primary) hypertension: Secondary | ICD-10-CM | POA: Diagnosis not present

## 2023-03-01 DIAGNOSIS — I8500 Esophageal varices without bleeding: Secondary | ICD-10-CM | POA: Diagnosis not present

## 2023-03-01 DIAGNOSIS — K7460 Unspecified cirrhosis of liver: Secondary | ICD-10-CM | POA: Insufficient documentation

## 2023-03-01 DIAGNOSIS — I8511 Secondary esophageal varices with bleeding: Secondary | ICD-10-CM

## 2023-03-01 DIAGNOSIS — K754 Autoimmune hepatitis: Secondary | ICD-10-CM | POA: Diagnosis not present

## 2023-03-01 DIAGNOSIS — K3189 Other diseases of stomach and duodenum: Secondary | ICD-10-CM | POA: Diagnosis not present

## 2023-03-01 HISTORY — PX: ESOPHAGOGASTRODUODENOSCOPY (EGD) WITH PROPOFOL: SHX5813

## 2023-03-01 SURGERY — ESOPHAGOGASTRODUODENOSCOPY (EGD) WITH PROPOFOL
Anesthesia: Monitor Anesthesia Care

## 2023-03-01 MED ORDER — PROPOFOL 500 MG/50ML IV EMUL
INTRAVENOUS | Status: AC
  Filled 2023-03-01: qty 100

## 2023-03-01 MED ORDER — LACTATED RINGERS IV SOLN
INTRAVENOUS | Status: DC
  Administered 2023-03-01: 1000 mL via INTRAVENOUS

## 2023-03-01 MED ORDER — PROPOFOL 500 MG/50ML IV EMUL
INTRAVENOUS | Status: DC | PRN
  Administered 2023-03-01: 100 ug/kg/min via INTRAVENOUS

## 2023-03-01 MED ORDER — SODIUM CHLORIDE 0.9 % IV SOLN: INTRAVENOUS | Status: DC

## 2023-03-01 MED ORDER — PROPOFOL 10 MG/ML IV BOLUS
INTRAVENOUS | Status: DC | PRN
  Administered 2023-03-01 (×2): 30 mg via INTRAVENOUS

## 2023-03-01 SURGICAL SUPPLY — 15 items

## 2023-03-01 NOTE — Discharge Instructions (Signed)

## 2023-03-01 NOTE — Anesthesia Postprocedure Evaluation (Signed)
Anesthesia Post Note  Patient: Timothy [name]  Procedure(s) Performed: ESOPHAGOGASTRODUODENOSCOPY (EGD) WITH PROPOFOL ESOPHAGEAL BANDING     Patient location during evaluation: Endoscopy Anesthesia Type: MAC Level of consciousness: awake Pain management: pain level controlled Vital Signs Assessment: post-procedure vital signs reviewed and stable Respiratory status: spontaneous breathing Cardiovascular status: stable Postop Assessment: no apparent nausea or vomiting Anesthetic complications: no  No notable events documented.  Last Vitals:  Vitals:   03/01/23 0830 03/01/23 0840  BP: 101/65 117/83  Pulse: 69 65  Resp: 11 (!) 21  Temp:    SpO2: 100% 98%    Last Pain:  Vitals:   03/01/23 0840  TempSrc:   PainSc: 0-No pain                 Caren Macadam

## 2023-03-01 NOTE — Addendum Note (Signed)
Addendum  created 03/01/23 0903 by Vanessa Forsyth, CRNA   Intraprocedure Meds edited

## 2023-03-01 NOTE — Transfer of Care (Signed)
Immediate Anesthesia Transfer of Care Note  Patient: Timothy [name]  Procedure(s) Performed: ESOPHAGOGASTRODUODENOSCOPY (EGD) WITH PROPOFOL ESOPHAGEAL BANDING  Patient Location: PACU  Anesthesia Type:MAC  Level of Consciousness: drowsy  Airway & Oxygen Therapy: Patient Spontanous Breathing and Patient connected to face mask  Post-op Assessment: Report given to RN and Post -op Vital signs reviewed and stable  Post vital signs: Reviewed and stable  Last Vitals:  Vitals Value Taken Time  BP    Temp    Pulse    Resp    SpO2      Last Pain:  Vitals:   03/01/23 0722  TempSrc: Temporal  PainSc: 0-No pain         Complications: No notable events documented.

## 2023-03-01 NOTE — Anesthesia Preprocedure Evaluation (Signed)
Anesthesia Evaluation  Patient identified by MRN, date of birth, ID band Patient awake    Reviewed: Allergy & Precautions, NPO status , Patient's Chart, lab work & pertinent test results, reviewed documented beta blocker date and time   History of Anesthesia Complications Negative for: history of anesthetic complications  Airway Mallampati: III  TM Distance: >3 FB Neck ROM: Full    Dental  (+) Partial Upper, Dental Advisory Given, Missing,    Pulmonary neg pulmonary ROS   Pulmonary exam normal breath sounds clear to auscultation       Cardiovascular hypertension, Pt. on home beta blockers (-) angina (-) Past MI, (-) Cardiac Stents and (-) CABG (-) dysrhythmias  Rhythm:Regular Rate:Normal     Neuro/Psych negative neurological ROS  negative psych ROS   GI/Hepatic negative GI ROS,,,(+) Cirrhosis  (portal HTN)  Esophageal Varices    , Hepatitis -, Autoimmune  Endo/Other  negative endocrine ROSneg diabetes    Renal/GU negative Renal ROS  negative genitourinary   Musculoskeletal negative musculoskeletal ROS (+)    Abdominal Normal abdominal exam  (+)   Peds  Hematology   Anesthesia Other Findings   Reproductive/Obstetrics                              Anesthesia Physical Anesthesia Plan  ASA: 3  Anesthesia Plan: MAC   Post-op Pain Management: Minimal or no pain anticipated   Induction: Intravenous  PONV Risk Score and Plan: 1 and Propofol infusion and Treatment may vary due to age or medical condition  Airway Management Planned: Natural Airway and Simple Face Mask  Additional Equipment: None  Intra-op Plan:   Post-operative Plan:   Informed Consent: I have reviewed the patients History and Physical, chart, labs and discussed the procedure including the risks, benefits and alternatives for the proposed anesthesia with the patient or authorized representative who has indicated  his/her understanding and acceptance.       Plan Discussed with: CRNA  Anesthesia Plan Comments: ( )        Anesthesia Quick Evaluation

## 2023-03-01 NOTE — Interval H&P Note (Signed)
History and Physical Interval Note: For EGD today for EVL protocol. No recent bleeding and he is feeling well He continues carvedilol and azathioprine He is also seeing [facility] liver clinic  The nature of the procedure, as well as the risks, benefits, and alternatives were carefully and thoroughly reviewed with the patient. Ample time for discussion and questions allowed. The patient understood, was satisfied, and agreed to proceed.    03/01/2023 8:08 AM  Timothy [name]  has presented today for surgery, with the diagnosis of Esophageal varices.  The various methods of treatment have been discussed with the patient and family. After consideration of risks, benefits and other options for treatment, the patient has consented to  Procedure(s): ESOPHAGOGASTRODUODENOSCOPY (EGD) WITH PROPOFOL (N/A) ESOPHAGEAL BANDING (N/A) as a surgical intervention.  The patient's history has been reviewed, patient examined, no change in status, stable for surgery.  I have reviewed the patient's chart and labs.  Questions were answered to the patient's satisfaction.     [name]

## 2023-03-01 NOTE — Anesthesia Procedure Notes (Signed)
Procedure Name: MAC Date/Time: 03/01/2023 8:10 AM  Performed by: Vanessa Tanana, CRNAPre-anesthesia Checklist: Patient identified, Emergency Drugs available, Suction available and Patient being monitored Patient Re-evaluated:Patient Re-evaluated prior to induction Oxygen Delivery Method: Simple face mask

## 2023-03-01 NOTE — Op Note (Signed)
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 Rhea Belton, MD, Adin Hector, RN, Harrington Challenger,                            Technician Referring MD:             Shan Levans, MD Medicines:                Monitored Anesthesia Care Complications:            No immediate complications. Estimated Blood Loss:     Estimated blood loss: none. Procedure:                Pre-Anesthesia Assessment:                           - Prior to the procedure, a History and Physical                            was performed, and patient medications and                            allergies were reviewed. The patient's tolerance of                            previous anesthesia was also reviewed. The risks                            and benefits of the procedure and the sedation                            options and risks were discussed with the patient.                            All questions were answered, and informed consent                            was obtained. Prior Anticoagulants: The patient has                            taken no anticoagulant or antiplatelet agents. ASA                            Grade Assessment: III - A patient with severe  systemic disease. After reviewing the risks and                            benefits, the patient was deemed in satisfactory                            condition to undergo the procedure.                           After obtaining informed consent, the endoscope was                            passed under direct vision. Throughout the                             procedure, the patient's blood pressure, pulse, and                            oxygen saturations were monitored continuously. The                            [id number]-H190 ([id number]) Olympus endoscope was introduced                            through the mouth, and advanced to the second part                            of duodenum. The upper GI endoscopy was                            accomplished without difficulty. The patient                            tolerated the procedure well. Scope In: Scope Out: Findings:      Post variceal banding scars found in the middle third of the esophagus       and in the lower third of the esophagus. No varices remaining.      Mild portal hypertensive gastropathy was found in the cardia, in the       gastric fundus and in the gastric body.      The examined duodenum was normal. Impression:               - Scarring in the middle third of the esophagus and                            in the lower third of the esophagus with successful                            eradication of esophageal varices.                           - Mild portal hypertensive gastropathy.                           -  Normal examined duodenum.                           - No specimens collected. Moderate Sedation:      N/A Recommendation:           - Patient has a contact number available for                            emergencies. The signs and symptoms of potential                            delayed complications were discussed with the                            patient. Return to normal activities tomorrow.                            Written discharge instructions were provided to the                            patient.                           - Resume previous diet. Low sodium.                           - Continue present medications.                           - Maintain follow-up with [facility].                           - Repeat upper endoscopy in 1 year for  surveillance. Procedure Code(s):        --- Professional ---                           [phone], Esophagogastroduodenoscopy, flexible,                            transoral; diagnostic, including collection of                            specimen(s) by brushing or washing, when performed                            (separate procedure) Diagnosis Code(s):        --- Professional ---                           K22.89, Other specified disease of esophagus                           K76.6, Portal hypertension                           K31.89, Other diseases of stomach and duodenum  I85.00, Esophageal varices without bleeding CPT copyright 2022 American [employer] Association. All rights reserved. The codes documented in this report are preliminary and upon coder review may  be revised to meet current compliance requirements. Beverley Fiedler, MD 03/01/2023 8:37:59 AM This report has been signed electronically. Number of Addenda: 0

## 2023-03-02 ENCOUNTER — Encounter (HOSPITAL_COMMUNITY): Payer: Self-pay | Admitting: Internal Medicine

## 2023-03-04 NOTE — Progress Notes (Signed)
GASTROENTEROLOGY PROCEDURE H&P NOTE   Primary Care Physician: Storm Frisk, MD    Reason for Procedure:   Esophageal varices in setting of cirrhosis  Plan:    EGD  Patient is appropriate for endoscopic procedure(s) in the outpatient hospital setting.  The nature of the procedure, as well as the risks, benefits, and alternatives were carefully and thoroughly reviewed with the patient. Ample time for discussion and questions allowed. The patient understood, was satisfied, and agreed to proceed.     HPI: Timothy [name] is a 62 y.o. male who presents for EGD.  Medical history as below.  No recent chest pain or shortness of breath.  No abdominal pain today.  Past Medical History:  Diagnosis Date   Autoimmune hepatitis (HCC)    Cirrhosis of liver (HCC)    Hypertension    Thrombocytopenia (HCC)     Past Surgical History:  Procedure Laterality Date   ESOPHAGEAL BANDING  10/20/2022   Procedure: ESOPHAGEAL BANDING;  Surgeon: Beverley Fiedler, MD;  Location: [facility] ENDOSCOPY;  Service: Gastroenterology;;   ESOPHAGEAL BANDING  11/11/2022   Procedure: ESOPHAGEAL BANDING;  Surgeon: Beverley Fiedler, MD;  Location: [facility] ENDOSCOPY;  Service: Gastroenterology;;   ESOPHAGEAL BANDING N/A 12/07/2022   Procedure: ESOPHAGEAL BANDING;  Surgeon: Hilarie Fredrickson, MD;  Location: [facility] ENDOSCOPY;  Service: Gastroenterology;  Laterality: N/A;   ESOPHAGOGASTRODUODENOSCOPY N/A 10/20/2022   Procedure: ESOPHAGOGASTRODUODENOSCOPY (EGD);  Surgeon: Beverley Fiedler, MD;  Location: [facility] ENDOSCOPY;  Service: Gastroenterology;  Laterality: N/A;  per ICU   ESOPHAGOGASTRODUODENOSCOPY (EGD) WITH PROPOFOL N/A 11/11/2022   Procedure: ESOPHAGOGASTRODUODENOSCOPY (EGD) WITH PROPOFOL;  Surgeon: Beverley Fiedler, MD;  Location: [facility] ENDOSCOPY;  Service: Gastroenterology;  Laterality: N/A;   ESOPHAGOGASTRODUODENOSCOPY (EGD) WITH PROPOFOL N/A 12/07/2022   Procedure: ESOPHAGOGASTRODUODENOSCOPY (EGD) WITH PROPOFOL;  Surgeon: Hilarie Fredrickson, MD;  Location: [facility]  ENDOSCOPY;  Service: Gastroenterology;  Laterality: N/A;   ESOPHAGOGASTRODUODENOSCOPY (EGD) WITH PROPOFOL N/A 03/01/2023   Procedure: ESOPHAGOGASTRODUODENOSCOPY (EGD) WITH PROPOFOL;  Surgeon: Beverley Fiedler, MD;  Location: [facility] ENDOSCOPY;  Service: Gastroenterology;  Laterality: N/A;    Prior to Admission medications   Medication Sig Start Date End Date Taking? Authorizing Provider  azaTHIOprine (IMURAN) 50 MG tablet Take 1 tablet (50 mg total) by mouth daily. 01/20/23  Yes   carvedilol (COREG) 3.125 MG tablet Take 1 tablet (3.125 mg total) by mouth in the morning and 1 tablet (3.125 mg total) in the evening. Take with meals. 02/16/23  Yes   docusate sodium (COLACE) 100 MG capsule Take 1 capsule (100 mg total) by mouth 2 (two) times daily as needed for mild constipation. 10/24/22  Yes Berton Mount I, MD  predniSONE (DELTASONE) 5 MG tablet Take 2 tablets (10 mg total) by mouth daily. 01/20/23  Yes   spironolactone (ALDACTONE) 100 MG tablet Take 1 tablet (100 mg total) by mouth daily. 01/05/23  Yes Storm Frisk, MD  furosemide (LASIX) 40 MG tablet Take 1 tablet (40 mg total) by mouth daily. 01/05/23   Storm Frisk, MD  pantoprazole (PROTONIX) 40 MG tablet Take 1 tablet (40 mg total) by mouth 2 (two) times daily before a meal. 01/05/23   Storm Frisk, MD    No current facility-administered medications for this encounter.   Current Outpatient Medications  Medication Sig Dispense Refill   azaTHIOprine (IMURAN) 50 MG tablet Take 1 tablet (50 mg total) by mouth daily. 30 tablet 11   carvedilol (COREG) 3.125 MG tablet Take 1 tablet (3.125 mg total)  by mouth in the morning and 1 tablet (3.125 mg total) in the evening. Take with meals. 180 tablet 3   docusate sodium (COLACE) 100 MG capsule Take 1 capsule (100 mg total) by mouth 2 (two) times daily as needed for mild constipation. 10 capsule 0   predniSONE (DELTASONE) 5 MG tablet Take 2 tablets (10 mg total) by mouth daily. 60 tablet 0    spironolactone (ALDACTONE) 100 MG tablet Take 1 tablet (100 mg total) by mouth daily. 90 tablet 11   furosemide (LASIX) 40 MG tablet Take 1 tablet (40 mg total) by mouth daily. 90 tablet 2   pantoprazole (PROTONIX) 40 MG tablet Take 1 tablet (40 mg total) by mouth 2 (two) times daily before a meal. 90 tablet 5    Allergies as of 12/10/2022 - Review Complete 12/07/2022  Allergen Reaction Noted   Nsaids  04/27/2022    Family History  Problem Relation Age of Onset   Hypertension Sister     Social History   Socioeconomic History   Marital status: Single    Spouse name: Not on file   Number of children: 0   Years of education: Not on file   Highest education level: Not on file  Occupational History   Occupation: retired  Tobacco Use   Smoking status: Never   Smokeless tobacco: Never  Vaping Use   Vaping status: Never Used  Substance and Sexual Activity   Alcohol use: No    Alcohol/week: 0.0 standard drinks of alcohol   Drug use: No   Sexual activity: Yes  Other Topics Concern   Not on file  Social History Narrative   Not on file   Social Determinants of Health   Financial Resource Strain: Not on file  Food Insecurity: Not on file  Transportation Needs: Not on file  Physical Activity: Not on file  Stress: Not on file  Social Connections: Not on file  Intimate Partner Violence: Not on file    Physical Exam: Vital signs in last 24 hours: @BP  125/84   Pulse 63   Temp (!) 97.1 F (36.2 C) (Temporal)   Resp 15   Ht 6\' 2"  (1.88 m)   Wt 85.3 kg   SpO2 100%   BMI 24.14 kg/m  GEN: NAD EYE: Sclerae anicteric ENT: MMM CV: Non-tachycardic Pulm: CTA b/l GI: Soft, NT/ND NEURO:  Alert & Oriented x 3   [name], MD [facility]  03/04/2023 10:22 AM

## 2023-03-09 ENCOUNTER — Other Ambulatory Visit: Payer: Self-pay

## 2023-03-09 MED ORDER — PREDNISONE 5 MG PO TABS
5.0000 mg | ORAL_TABLET | Freq: Every day | ORAL | 1 refills | Status: DC
  Filled 2023-03-09: qty 30, 30d supply, fill #0

## 2023-03-15 NOTE — Progress Notes (Unsigned)
Established Patient Office Visit  Subjective:  Patient ID: Timothy [name], male    DOB: 18-Nov-1960  Age: 62 y.o. MRN: [id number]  No chief complaint on file.    HPI 06/2021 [name] presents for primary care follow-up.  Patient has not been seen since a primary care visit in January 2022.  He missed his follow-up visit in April.  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  He does not drink alcohol.  Patient states has had increasing abdominal girth and swelling.  Patient states he denies any nausea or vomiting.  Note on arrival blood pressure is elevated 180/117.  He is only been taking Bystolic 10 mg daily.  He works Economist at night.  He is not compliant with a low-salt diet at this time.  The patient declined a flu vaccine and will give consideration to a pneumonia vaccine  7/25 Patient seen in return follow-up as not been seen since December.  Patient had extremely high blood pressures in the past but since starting Aldactone and Bystolic blood pressures have improved.  He had difficulty with fluid retention on amlodipine and significant diarrhea on valsartan and these medicines were stopped on the patient's own.  He has an automated blood pressure machine at home and he is getting readings anywhere from 117/74 to 116/72.  Patient was seen by the CPP in June as noted below. 12/2021 cpp Current antihypertensives: nebivolol 10 mg (cutting tablet in half), spironolactone 50 mg daily; self discontinued valsartan due to diarrhea.    Patient has an automated upper arm home BP machine.   Current blood pressure readings: 110-120s/80s at home.      Patient denies hypotensive signs and symptoms including dizziness, lightheadedness.  Patient denies hypertensive symptoms including headache, chest pain, shortness of breath.   Patient denies side effects related to nebivolol or spironolactone     Assessment/Plan: - Currently controlled - - Reviewed goal blood pressure <130/80 - Reviewed to check blood pressure daily, document, and provide at next provider visit     Patient notes he is uninsured, lost Friday Health coverage. He is interested in dental work - referral placed for [employer] [facility]. Also will notify PCP  Patient currently is uninsured and is interested in dental work he did get some work already obtained.  Patient knows he needs to get the orange card to see another dentist.  On arrival blood pressure today is 136/89 but blood pressures at home generally tend to run lower.  He has some pain in the right knee he uses topical Voltaren gel for this.  He has a rash on his back which causes itching.  He has right upper extremity bruising that was spontaneous.  Patient does have history of thrombocytopenia and liver disease.  Patient needs follow-up lab data at this visit.  01/05/23 Patient not seen by me since July of last year was seen by PA Mcclung in October.  Patient was admitted in April for significant gastrointestinal bleeding.  He had banding done at that time due to portal hypertension.  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  He does not drink alcohol.  Patient comes in today somewhat frustrated that he had a hard time getting back into our clinic.  Also frustrated at the pharmacy will not answer phones and refill his medications. Patient had subsequent banding by gastroenterology in May and June of this year and a repeat assessment in  August of this year.  He has had no further gastrointestinal bleeding. The patient has an appointment with the liver transplant center of [facility]   9/10  Past Medical History:  Diagnosis Date   Autoimmune hepatitis (HCC)    Cirrhosis of liver (HCC)    Hypertension    Thrombocytopenia (HCC)     Past Surgical History:  Procedure Laterality Date   ESOPHAGEAL BANDING  10/20/2022   Procedure: ESOPHAGEAL BANDING;  Surgeon:  Beverley Fiedler, MD;  Location: [facility] ENDOSCOPY;  Service: Gastroenterology;;   ESOPHAGEAL BANDING  11/11/2022   Procedure: ESOPHAGEAL BANDING;  Surgeon: Beverley Fiedler, MD;  Location: [facility] ENDOSCOPY;  Service: Gastroenterology;;   ESOPHAGEAL BANDING N/A 12/07/2022   Procedure: ESOPHAGEAL BANDING;  Surgeon: Hilarie Fredrickson, MD;  Location: [facility] ENDOSCOPY;  Service: Gastroenterology;  Laterality: N/A;   ESOPHAGOGASTRODUODENOSCOPY N/A 10/20/2022   Procedure: ESOPHAGOGASTRODUODENOSCOPY (EGD);  Surgeon: Beverley Fiedler, MD;  Location[facility] ENDOSCOPY;  Service: Gastroenterology;  Laterality: N/A;  per ICU   ESOPHAGOGASTRODUODENOSCOPY (EGD) WITH PROPOFOL N/A 11/11/2022   Procedure: ESOPHAGOGASTRODUODENOSCOPY (EGD) WITH PROPOFOL;  Surgeon: Beverley Fiedler, MD;  Location: [facility] ENDOSCOPY;  Service: Gastroenterology;  Laterality: N/A;   ESOPHAGOGASTRODUODENOSCOPY (EGD) WITH PROPOFOL N/A 12/07/2022   Procedure: ESOPHAGOGASTRODUODENOSCOPY (EGD) WITH PROPOFOL;  Surgeon: Hilarie Fredrickson, MD;  Location: [facility] ENDOSCOPY;  Service: Gastroenterology;  Laterality: N/A;   ESOPHAGOGASTRODUODENOSCOPY (EGD) WITH PROPOFOL N/A 03/01/2023   Procedure: ESOPHAGOGASTRODUODENOSCOPY (EGD) WITH PROPOFOL;  Surgeon: Beverley Fiedler, MD;  Location: [facility] ENDOSCOPY;  Service: Gastroenterology;  Laterality: N/A;    Family History  Problem Relation Age of Onset   Hypertension Sister     Social History   Socioeconomic History   Marital status: Single    Spouse name: Not on file   Number of children: 0   Years of education: Not on file   Highest education level: Not on file  Occupational History   Occupation: retired  Tobacco Use   Smoking status: Never   Smokeless tobacco: Never  Vaping Use   Vaping status: Never Used  Substance and Sexual Activity   Alcohol use: No    Alcohol/week: 0.0 standard drinks of alcohol   Drug use: No   Sexual activity: Yes  Other Topics Concern   Not on file  Social History Narrative   Not on file   Social Determinants of  Health   Financial Resource Strain: Not on file  Food Insecurity: Not on file  Transportation Needs: Not on file  Physical Activity: Not on file  Stress: Not on file  Social Connections: Not on file  Intimate Partner Violence: Not on file    Outpatient Medications Prior to Visit  Medication Sig Dispense Refill   azaTHIOprine (IMURAN) 50 MG tablet Take 1 tablet (50 mg total) by mouth daily. 30 tablet 11   carvedilol (COREG) 3.125 MG tablet Take 1 tablet (3.125 mg total) by mouth in the morning and 1 tablet (3.125 mg total) in the evening. Take with meals. 180 tablet 3   docusate sodium (COLACE) 100 MG capsule Take 1 capsule (100 mg total) by mouth 2 (two) times daily as needed for mild constipation. 10 capsule 0   furosemide (LASIX) 40 MG tablet Take 1 tablet (40 mg total) by mouth daily. 90 tablet 2   pantoprazole (PROTONIX) 40 MG tablet Take 1 tablet (40 mg total) by mouth 2 (two) times daily before a meal. 90 tablet 5   predniSONE (DELTASONE) 5 MG tablet Take 1 tablet (5 mg  total) by mouth daily. 30 tablet 1   spironolactone (ALDACTONE) 100 MG tablet Take 1 tablet (100 mg total) by mouth daily. 90 tablet 11   No facility-administered medications prior to visit.    Allergies  Allergen Reactions   Nsaids     Patient denies any allergy to NSAIDs (particular Advil) He takes this medication frequently without any adverse side effects     ROS Review of Systems  Constitutional: Negative.  Negative for chills, diaphoresis and fever.  HENT: Negative.  Negative for congestion, ear pain, hearing loss, nosebleeds, postnasal drip, rhinorrhea, sinus pressure, sore throat, tinnitus, trouble swallowing and voice change.   Eyes: Negative.  Negative for photophobia, redness and visual disturbance.  Respiratory: Negative.  Negative for apnea, cough, choking, chest tightness, shortness of breath, wheezing and stridor.   Cardiovascular:  Negative for chest pain, palpitations and leg swelling.   Gastrointestinal:  Negative for abdominal distention, abdominal pain, blood in stool, constipation, diarrhea, nausea and vomiting.  Endocrine: Negative for polydipsia.  Genitourinary: Negative.  Negative for difficulty urinating, dysuria, flank pain, frequency, hematuria and urgency.  Musculoskeletal: Negative.  Negative for arthralgias, back pain, myalgias and neck pain.       R knee pain  Skin: Negative.  Negative for rash.  Allergic/Immunologic: Negative.  Negative for environmental allergies and food allergies.  Neurological:  Negative for dizziness, tremors, seizures, syncope, weakness and headaches.  Hematological:  Negative for adenopathy. Bruises/bleeds easily.  Psychiatric/Behavioral: Negative.  Negative for agitation, hallucinations, self-injury, sleep disturbance and suicidal ideas. The patient is not nervous/anxious.       Objective:    Physical Exam Vitals reviewed.  Constitutional:      Appearance: Normal appearance. He is well-developed. He is obese. He is not diaphoretic.  HENT:     Head: Normocephalic and atraumatic.     Nose: Nose normal. No nasal deformity, septal deviation, mucosal edema or rhinorrhea.     Right Sinus: No maxillary sinus tenderness or frontal sinus tenderness.     Left Sinus: No maxillary sinus tenderness or frontal sinus tenderness.     Mouth/Throat:     Mouth: Mucous membranes are moist.     Pharynx: Oropharynx is clear. No oropharyngeal exudate.  Eyes:     General: No scleral icterus.    Conjunctiva/sclera: Conjunctivae normal.     Pupils: Pupils are equal, round, and reactive to light.  Neck:     Thyroid: No thyromegaly.     Vascular: No carotid bruit or JVD.     Trachea: Trachea normal. No tracheal tenderness or tracheal deviation.  Cardiovascular:     Rate and Rhythm: Normal rate and regular rhythm.     Chest Wall: PMI is not displaced.     Pulses: Normal pulses. No decreased pulses.     Heart sounds: Normal heart sounds, S1 normal  and S2 normal. Heart sounds not distant. No murmur heard.    No systolic murmur is present.     No diastolic murmur is present.     No friction rub. No gallop. No S3 or S4 sounds.  Pulmonary:     Effort: Pulmonary effort is normal. No tachypnea, accessory muscle usage or respiratory distress.     Breath sounds: Normal breath sounds. No stridor. No decreased breath sounds, wheezing, rhonchi or rales.  Chest:     Chest wall: No tenderness.  Abdominal:     General: Bowel sounds are normal. There is distension.     Palpations: Abdomen is soft. Abdomen is  not rigid.     Tenderness: There is no abdominal tenderness. There is no guarding or rebound.     Comments: Significant fluid wave felt compatible with ascites but not severe  Musculoskeletal:        General: Normal range of motion.     Cervical back: Normal range of motion and neck supple. No edema, erythema or rigidity. No muscular tenderness. Normal range of motion.     Right lower leg: Edema present.     Left lower leg: Edema present.  Lymphadenopathy:     Head:     Right side of head: No submental or submandibular adenopathy.     Left side of head: No submental or submandibular adenopathy.     Cervical: No cervical adenopathy.  Skin:    General: Skin is warm and dry.     Coloration: Skin is not pale.     Findings: No bruising or rash.     Nails: There is no clubbing.     Comments: No spider angiomas  Neurological:     Mental Status: He is alert and oriented to person, place, and time.     Sensory: No sensory deficit.  Psychiatric:        Mood and Affect: Mood normal.        Speech: Speech normal.        Behavior: Behavior normal.        Thought Content: Thought content normal.        Judgment: Judgment normal.     There were no vitals taken for this visit. Wt Readings from Last 3 Encounters:  03/01/23 188 lb (85.3 kg)  01/05/23 199 lb 6.4 oz (90.4 kg)  12/21/22 195 lb (88.5 kg)     Health Maintenance Due  Topic  Date Due   DTaP/Tdap/Td (1 - Tdap) Never done   Zoster Vaccines- Shingrix (1 of 2) Never done   INFLUENZA VACCINE  Never done    There are no preventive care reminders to display for this patient.  Lab Results  Component Value Date   TSH 1.940 06/10/2021   Lab Results  Component Value Date   WBC 4.7 11/04/2022   HGB 9.1 (L) 11/04/2022   HCT 27.6 (L) 11/04/2022   MCV 88.0 11/04/2022   PLT 110.0 (L) 11/04/2022   Lab Results  Component Value Date   NA 136 11/18/2022   K 4.3 11/18/2022   CHLORIDE 111 (H) 01/27/2016   CO2 28 11/18/2022   GLUCOSE 99 11/18/2022   BUN 14 11/18/2022   CREATININE 1.15 11/18/2022   BILITOT 1.2 11/04/2022   ALKPHOS 57 11/04/2022   AST 52 (H) 11/04/2022   ALT 21 11/04/2022   PROT 6.6 11/04/2022   ALBUMIN 2.6 (L) 11/04/2022   CALCIUM 8.8 11/18/2022   ANIONGAP 3 (L) 10/24/2022   EGFR 67 04/29/2022   GFR 68.08 11/18/2022   Lab Results  Component Value Date   CHOL 159 01/27/2022   Lab Results  Component Value Date   HDL 62 01/27/2022   Lab Results  Component Value Date   LDLCALC 86 01/27/2022   Lab Results  Component Value Date   TRIG 50 01/27/2022   Lab Results  Component Value Date   CHOLHDL 2.6 01/27/2022   Lab Results  Component Value Date   HGBA1C 5.9 (H) 06/11/2020      Assessment & Plan:   Problem List Items Addressed This Visit   None   No orders of the defined types were  placed in this encounter. 30 minutes spent extra time needing making multiple assessments and service recovery Follow-up: No follow-ups on file.    Shan Levans, MD

## 2023-03-16 ENCOUNTER — Ambulatory Visit: Payer: Medicaid Other | Attending: Critical Care Medicine | Admitting: Critical Care Medicine

## 2023-03-16 ENCOUNTER — Encounter: Payer: Self-pay | Admitting: Critical Care Medicine

## 2023-03-16 ENCOUNTER — Other Ambulatory Visit: Payer: Self-pay

## 2023-03-16 VITALS — BP 121/82 | HR 66 | Wt 189.0 lb

## 2023-03-16 DIAGNOSIS — I8500 Esophageal varices without bleeding: Secondary | ICD-10-CM

## 2023-03-16 DIAGNOSIS — K7460 Unspecified cirrhosis of liver: Secondary | ICD-10-CM | POA: Diagnosis not present

## 2023-03-16 DIAGNOSIS — I10 Essential (primary) hypertension: Secondary | ICD-10-CM

## 2023-03-16 DIAGNOSIS — K766 Portal hypertension: Secondary | ICD-10-CM

## 2023-03-16 DIAGNOSIS — I8511 Secondary esophageal varices with bleeding: Secondary | ICD-10-CM

## 2023-03-16 DIAGNOSIS — D696 Thrombocytopenia, unspecified: Secondary | ICD-10-CM

## 2023-03-16 MED ORDER — PREDNISONE 5 MG PO TABS
5.0000 mg | ORAL_TABLET | Freq: Every day | ORAL | 1 refills | Status: DC
  Filled 2023-03-16 – 2023-03-18 (×3): qty 30, 30d supply, fill #0
  Filled 2023-04-08 – 2023-04-12 (×2): qty 30, 30d supply, fill #1
  Filled 2023-05-11: qty 30, 30d supply, fill #2
  Filled 2023-06-10: qty 30, 30d supply, fill #3
  Filled 2023-07-08: qty 30, 30d supply, fill #4

## 2023-03-16 NOTE — Assessment & Plan Note (Signed)
Controlled at this time with carvedilol and spironolactone check labs

## 2023-03-16 NOTE — Assessment & Plan Note (Signed)
As per gastroenterology recent banding no bleeding

## 2023-03-16 NOTE — Patient Instructions (Signed)
No medication changes refills sent Labs today  REturn 3 months

## 2023-03-16 NOTE — Assessment & Plan Note (Signed)
Being evaluated for transplantation staying on prednisone and Imuran check blood count liver function

## 2023-03-16 NOTE — Assessment & Plan Note (Signed)
Monitor CBC due to liver disease

## 2023-03-16 NOTE — Assessment & Plan Note (Signed)
Continue current diuretic medicine and treatment per gastroenterology

## 2023-03-17 ENCOUNTER — Other Ambulatory Visit: Payer: Self-pay

## 2023-03-17 ENCOUNTER — Telehealth: Payer: Self-pay

## 2023-03-17 LAB — COMPREHENSIVE METABOLIC PANEL
ALT: 46 IU/L — ABNORMAL HIGH (ref 0–44)
AST: 80 IU/L — ABNORMAL HIGH (ref 0–40)
Albumin: 3.5 g/dL — ABNORMAL LOW (ref 3.9–4.9)
Alkaline Phosphatase: 95 IU/L (ref 44–121)
BUN/Creatinine Ratio: 13 (ref 10–24)
BUN: 18 mg/dL (ref 8–27)
Bilirubin Total: 0.8 mg/dL (ref 0.0–1.2)
CO2: 24 mmol/L (ref 20–29)
Calcium: 9.4 mg/dL (ref 8.6–10.2)
Chloride: 107 mmol/L — ABNORMAL HIGH (ref 96–106)
Creatinine, Ser: 1.35 mg/dL — ABNORMAL HIGH (ref 0.76–1.27)
Globulin, Total: 3.8 g/dL (ref 1.5–4.5)
Glucose: 87 mg/dL (ref 70–99)
Potassium: 5 mmol/L (ref 3.5–5.2)
Sodium: 141 mmol/L (ref 134–144)
Total Protein: 7.3 g/dL (ref 6.0–8.5)
eGFR: 59 mL/min/{1.73_m2} — ABNORMAL LOW (ref >59)

## 2023-03-17 LAB — CBC WITH DIFFERENTIAL/PLATELET
Basophils Absolute: 0 10*3/uL (ref 0.0–0.2)
Basos: 1 %
EOS (ABSOLUTE): 0.2 10*3/uL (ref 0.0–0.4)
Eos: 7 %
Hematocrit: 36.4 % — ABNORMAL LOW (ref 37.5–51.0)
Hemoglobin: 11.7 g/dL — ABNORMAL LOW (ref 13.0–17.7)
Immature Grans (Abs): 0 10*3/uL (ref 0.0–0.1)
Immature Granulocytes: 0 %
Lymphocytes Absolute: 0.9 10*3/uL (ref 0.7–3.1)
Lymphs: 28 %
MCH: 26.2 pg — ABNORMAL LOW (ref 26.6–33.0)
MCHC: 32.1 g/dL (ref 31.5–35.7)
MCV: 81 fL (ref 79–97)
Monocytes Absolute: 0.5 10*3/uL (ref 0.1–0.9)
Monocytes: 18 %
Neutrophils Absolute: 1.4 10*3/uL (ref 1.4–7.0)
Neutrophils: 46 %
Platelets: 95 10*3/uL — CL (ref 150–450)
RBC: 4.47 x10E6/uL (ref 4.14–5.80)
RDW: 21.9 % — ABNORMAL HIGH (ref 11.6–15.4)
WBC: 3 10*3/uL — ABNORMAL LOW (ref 3.4–10.8)

## 2023-03-17 NOTE — Telephone Encounter (Signed)
-----   Message from Shan Levans sent at 03/17/2023  7:56 AM EDT ----- Let pt know liver function stable kidney stable  platelet count stable no med changes

## 2023-03-17 NOTE — Telephone Encounter (Signed)
Pt was called and is aware of results, DOB was confirmed.  ?

## 2023-03-17 NOTE — Progress Notes (Signed)
Let pt know liver function stable kidney stable  platelet count stable no med changes

## 2023-03-18 ENCOUNTER — Other Ambulatory Visit: Payer: Self-pay

## 2023-03-26 ENCOUNTER — Telehealth: Payer: Self-pay | Admitting: Critical Care Medicine

## 2023-03-26 NOTE — Telephone Encounter (Signed)
Copied from CRM [id number]. Topic: General - Other >> Mar 26, 2023  9:58 AM Turkey B wrote: Reason for CRM: pt called in requesting bp machine

## 2023-03-29 MED ORDER — BLOOD PRESSURE KIT DEVI: 0 refills | Status: AC

## 2023-03-29 NOTE — Addendum Note (Signed)
Addended by: Storm Frisk on: 03/29/2023 04:34 AM   Modules accepted: Orders

## 2023-03-29 NOTE — Telephone Encounter (Signed)
[facility]p [facility] they can get it for free from [facility]

## 2023-03-30 ENCOUNTER — Telehealth: Payer: Self-pay | Admitting: Critical Care Medicine

## 2023-03-30 DIAGNOSIS — I8500 Esophageal varices without bleeding: Secondary | ICD-10-CM

## 2023-03-30 DIAGNOSIS — R600 Localized edema: Secondary | ICD-10-CM

## 2023-03-30 NOTE — Telephone Encounter (Signed)
Copied from CRM [id number]. Topic: General - Other >> Mar 30, 2023 11:51 AM Macon Large wrote: Reason for CRM: Pt requests that a prescription for compression sock be sent to [facility] [address]. Pt stated he needs about 7 pairs.

## 2023-03-31 NOTE — Addendum Note (Signed)
Addended by: Storm Frisk on: 03/31/2023 05:18 PM   Modules accepted: Orders

## 2023-03-31 NOTE — Telephone Encounter (Signed)
Patient has called to check on this request. Please contact patient back @ # [phone].

## 2023-03-31 NOTE — Telephone Encounter (Addendum)
Likely wont be paid by medicaid and rx made on printer will need to fax to [facility]

## 2023-04-01 ENCOUNTER — Telehealth (INDEPENDENT_AMBULATORY_CARE_PROVIDER_SITE_OTHER): Payer: Self-pay | Admitting: Critical Care Medicine

## 2023-04-01 NOTE — Telephone Encounter (Signed)
See other message

## 2023-04-01 NOTE — Telephone Encounter (Signed)
Talked with Erskine Squibb and we will fax to a medical supply company. Patent is aware they will call for fitting

## 2023-04-01 NOTE — Telephone Encounter (Signed)
Copied from CRM [id number]. Topic: General - Other >> Apr 01, 2023 12:52 PM Ja-Kwan M wrote: Reason for CRM: Pt called for update on if the Rx for compression socks was sent to the pharmacy. Pt requests call back. Cb# [phone]

## 2023-04-01 NOTE — Telephone Encounter (Signed)
Called patient and he has already picked it up

## 2023-04-02 NOTE — Telephone Encounter (Signed)
FYI

## 2023-04-02 NOTE — Telephone Encounter (Signed)
Pt called back saying only one supply place will medicaid cover.  That is [facility] located on [address]  He said the other pharmacy medicaid will not cover.  CB@  [phone]

## 2023-04-02 NOTE — Telephone Encounter (Signed)
Pt calling back to ask why his stocking order had not been done.  Pt left the info about the stockings just today for the new provider that will accept his medicaid. Unable to reach the office, but advised pt I would send another message to the nurse.  Pt was appreciative.

## 2023-04-05 ENCOUNTER — Telehealth: Payer: Self-pay

## 2023-04-05 NOTE — Telephone Encounter (Signed)
Connected with [employer]

## 2023-04-05 NOTE — Telephone Encounter (Signed)
Pt called back for update, says he is upset that he has not yet been contacted by the clinic. Says he wants to speak to someone today. Please advise

## 2023-04-05 NOTE — Telephone Encounter (Signed)
See other message

## 2023-04-05 NOTE — Telephone Encounter (Signed)
Patient called in about his diabetes sock, stating the place we sent the order is not cover by medicaid. They gave him the name of the [facility] cover. They are closed today I will call tomorrow to get information on their process

## 2023-04-07 ENCOUNTER — Telehealth: Payer: Self-pay

## 2023-04-07 NOTE — Telephone Encounter (Signed)
[facility]ed [facility]

## 2023-04-07 NOTE — Telephone Encounter (Signed)
Copied from CRM [id number]. Topic: General - Other >> Apr 07, 2023  9:05 AM Phill Myron wrote: Please call regarding Compression Socks, what company accepts medicaid?

## 2023-04-08 ENCOUNTER — Other Ambulatory Visit: Payer: Self-pay

## 2023-04-08 ENCOUNTER — Telehealth: Payer: Self-pay

## 2023-04-08 NOTE — Telephone Encounter (Signed)
Called patient and updated him. 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 I told him yes and that it in his chart

## 2023-04-08 NOTE — Telephone Encounter (Signed)
Copied from CRM [id number]. Topic: General - Inquiry >> Apr 08, 2023  9:38 AM Timothy [name] wrote: Reason for CRM: pt called asking what month was he first diagnosed with cirrhosis of the liver  CB#  [phone]

## 2023-04-09 ENCOUNTER — Telehealth: Payer: Self-pay

## 2023-04-09 NOTE — Telephone Encounter (Signed)
Copied from CRM [id number]. Topic: General - Other >> Apr 09, 2023 10:49 AM Phill Myron wrote: Salome ArntDalene Carrow I   here is the name of the company  [facility] on [address]  7 pair of compression of socks (lower level)  Patient would like a call back

## 2023-04-12 ENCOUNTER — Other Ambulatory Visit: Payer: Self-pay

## 2023-04-13 NOTE — Telephone Encounter (Signed)
I called the patient and explained that I have not been able to find a clinic/store that carry compression stockings and will bill Medicaid.  I told him who I called today.  He said he understood that they are not always covered by insurance and he asked that I call him back if I am able to find a place that carries them and is in network with his insurance company.

## 2023-04-13 NOTE — Telephone Encounter (Signed)
I called [facility] and they carry some compression stockings but are not in network with [employer], including the [employer].  I called [facility]: [phone]  and they do not carry compression stockings.  I called [facility]: [phone] and they do not carry compression stockings.

## 2023-04-20 ENCOUNTER — Ambulatory Visit: Payer: Self-pay | Admitting: Internal Medicine

## 2023-04-20 ENCOUNTER — Other Ambulatory Visit: Payer: Self-pay

## 2023-05-07 ENCOUNTER — Ambulatory Visit: Payer: Self-pay | Admitting: *Deleted

## 2023-05-07 ENCOUNTER — Other Ambulatory Visit: Payer: Self-pay

## 2023-05-07 NOTE — Telephone Encounter (Signed)
  Chief Complaint: blood pressure fluctuating and c/o headaches after taking medications. Symptoms: BP this am 103/73 this am and rechecked now for BP 131/82 HR 70. C/o headaches noted approx 15 minutes after taking am and pm medications. Reports drinking plenty of water. Has felt like passing out at times but not now.  Frequency: a few days  Pertinent Negatives: Patient denies dizziness no lightheadedness. No blurred vision no vomiting.  Disposition: [] ED /[] Urgent Care (no appt availability in office) / [] Appointment(In office/virtual)/ []  [facility]/ [] Home Care/ [x] Refused Recommended Disposition /[] [facility]/ []  Follow-up with PCP Additional Notes:   No available appt .until Nov 16. Recommended UC if sx worsen. Patient concerned medication combination causing sx. Please advise . Patient requesting a call back.   Patient would like to see a provider due to medication management .   Summary: bp concerns stated bp is running low 103/73 with headaches.   Pt stated has BP concerns stated BP is running low 103/73 with headaches. Pt has an upcoming appointment on 12/10. Asking to be seen sooner.   No appoinments.  Seeking clinical advice.              Reason for Disposition  Wants doctor to measure BP  Answer Assessment - Initial Assessment Questions 1. BLOOD PRESSURE: "What is the blood pressure?" "Did you take at least two measurements 5 minutes apart?"     BP 103/73 recehck BP 131/82 HR 70  2. ONSET: "When did you take your blood pressure?"     now 3. HOW: "How did you obtain the blood pressure?" (e.g., visiting nurse, automatic home BP monitor)     Home monitor  4. HISTORY: "Do you have a history of low blood pressure?" "What is your blood pressure normally?"     No  5. MEDICINES: "Are you taking any medications for blood pressure?" If Yes, ask: "Have they been changed recently?"     Coreg and spirolactone , lasix 6. PULSE RATE: "Do you know what  your pulse rate is?"      70 7. OTHER SYMPTOMS: "Have you been sick recently?" "Have you had a recent injury?"     Na  8. PREGNANCY: "Is there any chance you are pregnant?" "When was your last menstrual period?"     na  Protocols used: Blood Pressure - Low-A-AH

## 2023-05-07 NOTE — Telephone Encounter (Signed)
Spoke with patient . Patient reports that he has been very light at times. Report Bp have been running low at times 103/73 asked to recheck while was were on the phone 122/70. Patient denies having any fluid noted in extremities voices that he has not been having as much fluid as he normally does. Voices that his HA is better now . Declines going to the ED or UC for evaluation. Advised patient to hold off on taking Lasix and spirolactone this weekend. Patient agrees to come in on our walkin schedule for Monday 05/10/2023. Advised patient that with walkin schedule He can only discuss his BP s/s Also, He may have to wait as we are working his visit in to see one of our providers. Advised that if s/s worsen he should go to the ED or UC . Patient voiced understanding of all discussed .

## 2023-05-10 ENCOUNTER — Ambulatory Visit: Payer: MEDICAID | Admitting: Nurse Practitioner

## 2023-05-10 ENCOUNTER — Telehealth: Payer: Self-pay | Admitting: Critical Care Medicine

## 2023-05-10 NOTE — Telephone Encounter (Signed)
Pt came in this morning as walk-in.8:30am. pt was aware that it was going to be a wait  and we will call him with the first available appointment ... CMA called the pt name pt didn't response pt not in the lobby. I reach out to the pt 4x left vm no response

## 2023-05-10 NOTE — Progress Notes (Signed)
Timothy [name] was a walk in patient today. He was placed on my schedule however he walked out of the office as he did not want to wait to be seen.

## 2023-05-12 ENCOUNTER — Other Ambulatory Visit: Payer: Self-pay

## 2023-05-18 ENCOUNTER — Other Ambulatory Visit: Payer: Self-pay

## 2023-05-19 ENCOUNTER — Other Ambulatory Visit: Payer: Self-pay

## 2023-06-10 ENCOUNTER — Other Ambulatory Visit: Payer: Self-pay

## 2023-06-14 ENCOUNTER — Telehealth: Payer: Self-pay | Admitting: Internal Medicine

## 2023-06-14 NOTE — Telephone Encounter (Signed)
PT is calling to find out if he was to have a FU next year. There is no recall and he has no issues. He just remembers Dr. Rhea Belton saying he'll see him next year. Tried to schedule the FU and he said he wanted to speak with nurse.

## 2023-06-15 ENCOUNTER — Encounter: Payer: Self-pay | Admitting: Family Medicine

## 2023-06-15 ENCOUNTER — Ambulatory Visit: Payer: Medicaid Other | Attending: Family Medicine | Admitting: Family Medicine

## 2023-06-15 ENCOUNTER — Other Ambulatory Visit: Payer: Self-pay

## 2023-06-15 VITALS — BP 120/70 | HR 64 | Ht 74.0 in | Wt 199.6 lb

## 2023-06-15 DIAGNOSIS — I10 Essential (primary) hypertension: Secondary | ICD-10-CM

## 2023-06-15 DIAGNOSIS — K7460 Unspecified cirrhosis of liver: Secondary | ICD-10-CM

## 2023-06-15 DIAGNOSIS — R29898 Other symptoms and signs involving the musculoskeletal system: Secondary | ICD-10-CM

## 2023-06-15 DIAGNOSIS — R252 Cramp and spasm: Secondary | ICD-10-CM

## 2023-06-15 MED ORDER — CYCLOBENZAPRINE HCL 10 MG PO TABS: 10.0000 mg | ORAL_TABLET | Freq: Every evening | ORAL | 1 refills | Status: DC | PRN

## 2023-06-15 MED ORDER — CYCLOBENZAPRINE HCL 10 MG PO TABS
10.0000 mg | ORAL_TABLET | Freq: Every evening | ORAL | 1 refills | Status: DC | PRN
Start: 2023-06-15 — End: 2024-01-05
  Filled 2023-06-15: qty 21, 21d supply, fill #0
  Filled 2023-08-05: qty 21, 21d supply, fill #1

## 2023-06-15 NOTE — Patient Instructions (Signed)
VISIT SUMMARY:  During today's visit, we discussed your liver function, right leg weakness, muscle cramps, and hypertension. We have made some adjustments to your treatment plan and ordered necessary tests to monitor your health conditions.  YOUR PLAN:  -CIRRHOSIS SECONDARY TO HEPATITIS C: Cirrhosis is a condition where the liver becomes severely scarred due to long-term damage, in this case from Hepatitis C. We will check your liver function tests today and plan to repeat these tests every three months to monitor your liver health.  -RIGHT LEG WEAKNESS: You reported feeling like your right leg is weak and 'just hanging on' when walking. We will refer you to physical therapy to help strengthen your leg.  -MUSCLE CRAMPS: Muscle cramps can be caused by low potassium levels, especially since you are taking a diuretic called furosemide. We will check your potassium levels today and prescribe a muscle relaxant to take at bedtime to help with the cramping.  -HYPERTENSION: Hypertension, or high blood pressure, is being managed with your current medication, carvedilol. Please continue taking it as prescribed.  INSTRUCTIONS:  Please follow up with your gastroenterologist as scheduled in January. We will see you again in three months to review your progress and any test results. Make sure to attend physical therapy sessions for your leg and take the prescribed muscle relaxant at bedtime.

## 2023-06-15 NOTE — Progress Notes (Signed)
Subjective:  Patient ID: Timothy [name], male    DOB: 10-13-1960  Age: 62 y.o. MRN: [id number]  CC: Hypertension (Pain in right leg/Discuss liver concerns)   HPI Timothy [name] is a 62 y.o. year old male patient of Dr. 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  He presents with concerns about his liver function. He expresses a desire to have his liver enzymes checked, which were last tested three months ago. He is scheduled to see his gastroenterologist in January remains on azathioprine.  The patient also reports experiencing pain in his right lower extremity. He describes the sensation as if his leg is "just hanging on" and "limber" when walking. The pain is intermittent and not severe. He has a history of ankle problems in the same leg. He has been using a cane, prescribed by a previous doctor, to assist with mobility due to this issue.  He denies presence of back pain, numbness in his leg but states somehow he feels his leg gives out.  The patient also reports experiencing cramps in his hands and feet, particularly at night.  The patient is on several medications including carvedilol for hypertension, furosemide, spironolactone, and azathioprine for his liver cirrhosis. The azathioprine is prescribed by his gastroenterologist. He reports no issues with fluid retention but does experience shortness of breath and cramping when attempting to work.        Past Medical History:  Diagnosis Date   Autoimmune hepatitis (HCC)    Cirrhosis of liver (HCC)    Hypertension    Thrombocytopenia (HCC)     Past Surgical History:  Procedure Laterality Date   ESOPHAGEAL BANDING  10/20/2022   Procedure: ESOPHAGEAL BANDING;  Surgeon: Beverley Fiedler, MD;   Location: [facility] ENDOSCOPY;  Service: Gastroenterology;;   ESOPHAGEAL BANDING  11/11/2022   Procedure: ESOPHAGEAL BANDING;  Surgeon: Beverley Fiedler, MD;  Location: [facility] ENDOSCOPY;  Service: Gastroenterology;;   ESOPHAGEAL BANDING N/A 12/07/2022   Procedure: ESOPHAGEAL BANDING;  Surgeon: Hilarie Fredrickson, MD;  Location: [facility] ENDOSCOPY;  Service: Gastroenterology;  Laterality: N/A;   ESOPHAGOGASTRODUODENOSCOPY N/A 10/20/2022   Procedure: ESOPHAGOGASTRODUODENOSCOPY (EGD);  Surgeon: Beverley Fiedler, MD;  Location: [facility] ENDOSCOPY;  Service: Gastroenterology;  Laterality: N/A;  per ICU   ESOPHAGOGASTRODUODENOSCOPY (EGD) WITH PROPOFOL N/A 11/11/2022   Procedure: ESOPHAGOGASTRODUODENOSCOPY (EGD) WITH PROPOFOL;  Surgeon: Beverley Fiedler, MD;  Location: [facility] ENDOSCOPY;  Service: Gastroenterology;  Laterality: N/A;   ESOPHAGOGASTRODUODENOSCOPY (EGD) WITH PROPOFOL N/A 12/07/2022   Procedure: ESOPHAGOGASTRODUODENOSCOPY (EGD) WITH PROPOFOL;  Surgeon: Hilarie Fredrickson, MD;  Location: [facility] ENDOSCOPY;  Service: Gastroenterology;  Laterality: N/A;   ESOPHAGOGASTRODUODENOSCOPY (EGD) WITH PROPOFOL N/A 03/01/2023   Procedure: ESOPHAGOGASTRODUODENOSCOPY (EGD) WITH PROPOFOL;  Surgeon: Beverley Fiedler, MD;  Location: [facility] ENDOSCOPY;  Service: Gastroenterology;  Laterality: N/A;    Family History  Problem Relation Age of Onset   Hypertension Sister     Social History   Socioeconomic History   Marital status: Single    Spouse name: Not on file   Number of children: 0   Years of education: Not on file   Highest education level: Not on file  Occupational History   Occupation: retired  Tobacco Use   Smoking status: Never   Smokeless tobacco: Never  Vaping Use   Vaping status:  Never Used  Substance and Sexual Activity   Alcohol use: No    Alcohol/week: 0.0 standard drinks of alcohol   Drug use: No   Sexual activity: Yes  Other Topics Concern   Not on file  Social History Narrative   Not on file   Social Determinants of Health   Financial  Resource Strain: Not on file  Food Insecurity: Food Insecurity Present (06/15/2023)   Hunger Vital Sign    Worried About Running Out of Food in the Last Year: Never true    Ran Out of Food in the Last Year: Sometimes true  Transportation Needs: No Transportation Needs (06/15/2023)   PRAPARE - Administrator, Civil Service (Medical): No    Lack of Transportation (Non-Medical): No  Physical Activity: Not on file  Stress: Not on file  Social Connections: Unknown (06/15/2023)   Social Connection and Isolation Panel [NHANES]    Frequency of Communication with Friends and Family: Three times a week    Frequency of Social Gatherings with Friends and Family: Three times a week    Attends Religious Services: Not on file    Active Member of Clubs or Organizations: Not on file    Attends Banker Meetings: Not on file    Marital Status: Not on file    Allergies  Allergen Reactions   Nsaids     Patient denies any allergy to NSAIDs (particular Advil) He takes this medication frequently without any adverse side effects     Outpatient Medications Prior to Visit  Medication Sig Dispense Refill   azaTHIOprine (IMURAN) 50 MG tablet Take 1 tablet (50 mg total) by mouth daily. 30 tablet 11   Blood Pressure Monitoring (BLOOD PRESSURE KIT) DEVI Use to measure blood pressure 1 each 0   carvedilol (COREG) 3.125 MG tablet Take 1 tablet (3.125 mg total) by mouth in the morning and 1 tablet (3.125 mg total) in the evening. Take with meals. 180 tablet 3   docusate sodium (COLACE) 100 MG capsule Take 1 capsule (100 mg total) by mouth 2 (two) times daily as needed for mild constipation. 10 capsule 0   furosemide (LASIX) 40 MG tablet Take 1 tablet (40 mg total) by mouth daily. 90 tablet 2   predniSONE (DELTASONE) 5 MG tablet Take 1 tablet (5 mg total) by mouth daily. 90 tablet 1   spironolactone (ALDACTONE) 100 MG tablet Take 1 tablet (100 mg total) by mouth daily. 90 tablet 11   No  facility-administered medications prior to visit.     ROS Review of Systems  Constitutional:  Negative for activity change and appetite change.  HENT:  Negative for sinus pressure and sore throat.   Respiratory:  Positive for shortness of breath. Negative for chest tightness and wheezing.   Cardiovascular:  Negative for chest pain and palpitations.  Gastrointestinal:  Negative for abdominal distention, abdominal pain and constipation.  Genitourinary: Negative.   Musculoskeletal: Negative.        See HPI  Psychiatric/Behavioral:  Negative for behavioral problems and dysphoric mood.     Objective:  BP 120/70   Pulse 64   Ht 6\' 2"  (1.88 m)   Wt 199 lb 9.6 oz (90.5 kg)   SpO2 99%   BMI 25.63 kg/m      06/15/2023    2:16 PM 03/16/2023    1:39 PM 03/01/2023    8:50 AM  BP/Weight  Systolic BP 120 121 125  Diastolic BP 70 82 84  Wt. (  Lbs) 199.6 189   BMI 25.63 kg/m2 24.27 kg/m2       Physical Exam Constitutional:      Appearance: He is well-developed.  Cardiovascular:     Rate and Rhythm: Normal rate.     Heart sounds: Normal heart sounds. No murmur heard. Pulmonary:     Effort: Pulmonary effort is normal.     Breath sounds: Normal breath sounds. No wheezing or rales.  Chest:     Chest wall: No tenderness.  Abdominal:     General: Bowel sounds are normal. There is no distension.     Palpations: Abdomen is soft. There is no mass.     Tenderness: There is no abdominal tenderness.  Musculoskeletal:        General: Normal range of motion.     Lumbar back: No tenderness.     Right lower leg: Edema (1+) present.     Left lower leg: No edema.  Neurological:     Mental Status: He is alert and oriented to person, place, and time.     Comments: Motor strength in right lower extremity-4/5 Motor strength in left lower extremity-5/5  Psychiatric:        Mood and Affect: Mood normal.        Latest Ref Rng & Units 03/16/2023    2:11 PM 11/18/2022    3:29 PM 11/04/2022    12:51 PM  CMP  Glucose 70 - 99 mg/dL 87  99  94   BUN 8 - 27 mg/dL 18  14  14    Creatinine 0.76 - 1.27 mg/dL 4.09  8.11  9.14   Sodium 134 - 144 mmol/L 141  136  138   Potassium 3.5 - 5.2 mmol/L 5.0  4.3  4.1   Chloride 96 - 106 mmol/L 107  104  108   CO2 20 - 29 mmol/L 24  28  24    Calcium 8.6 - 10.2 mg/dL 9.4  8.8  8.0   Total Protein 6.0 - 8.5 g/dL 7.3   6.6   Total Bilirubin 0.0 - 1.2 mg/dL 0.8   1.2   Alkaline Phos 44 - 121 IU/L 95   57   AST 0 - 40 IU/L 80   52   ALT 0 - 44 IU/L 46   21     Lipid Panel     Component Value Date/Time   CHOL 159 01/27/2022 0912   TRIG 50 01/27/2022 0912   HDL 62 01/27/2022 0912   CHOLHDL 2.6 01/27/2022 0912   CHOLHDL 2.8 03/29/2015 1111   VLDL 14 03/29/2015 1111   LDLCALC 86 01/27/2022 0912    CBC    Component Value Date/Time   WBC 3.0 (L) 03/16/2023 1411   WBC 4.7 11/04/2022 1251   RBC 4.47 03/16/2023 1411   RBC 3.14 (L) 11/04/2022 1251   HGB 11.7 (L) 03/16/2023 1411   HGB 14.5 01/27/2016 1005   HCT 36.4 (L) 03/16/2023 1411   HCT 42.1 01/27/2016 1005   PLT 95 (LL) 03/16/2023 1411   MCV 81 03/16/2023 1411   MCV 84.7 01/27/2016 1005   MCH 26.2 (L) 03/16/2023 1411   MCH 30.2 10/23/2022 0113   MCHC 32.1 03/16/2023 1411   MCHC 32.8 11/04/2022 1251   RDW 21.9 (H) 03/16/2023 1411   RDW 15.6 (H) 01/27/2016 1005   LYMPHSABS 0.9 03/16/2023 1411   LYMPHSABS 1.8 01/27/2016 1005   MONOABS 0.7 11/04/2022 1251   MONOABS 0.5 01/27/2016 1005   EOSABS 0.2 03/16/2023  1411   BASOSABS 0.0 03/16/2023 1411   BASOSABS 0.0 01/27/2016 1005    Lab Results  Component Value Date   HGBA1C 5.9 (H) 06/11/2020    Assessment & Plan:      Cirrhosis  Secondary to autoimmune hepatitis and hepatitis C Patient is under the care of a gastroenterologist. Last liver function tests were done three months ago. -Order liver function tests today. -Continue azathioprine per GI  Right leg weakness Patient reports feeling of leg "just hanging on" when  walking, but no difficulty bearing weight. No associated back pain. Physical exam shows right leg is weaker than left. -Refer to physical therapy for strengthening exercises.  Muscle cramps Patient reports cramping, especially at night. Currently on furosemide, a diuretic that can cause low potassium, which can lead to muscle cramps. -Check potassium levels today. -Prescribe muscle relaxant to be taken at bedtime to help with cramping.  Hypertension Patient is on carvedilol. -Continue carvedilol as prescribed. -Counseled on blood pressure goal of less than 130/80, low-sodium, DASH diet, medication compliance, 150 minutes of moderate intensity exercise per week. Discussed medication compliance, adverse effects.   Follow-up Plan to see patient again in three months. Patient will continue to follow up with gastroenterologist.          Meds ordered this encounter  Medications   DISCONTD: cyclobenzaprine (FLEXERIL) 10 MG tablet    Sig: Take 1 tablet (10 mg total) by mouth at bedtime as needed for muscle spasms.    Dispense:  30 tablet    Refill:  1   cyclobenzaprine (FLEXERIL) 10 MG tablet    Sig: Take 1 tablet (10 mg total) by mouth at bedtime as needed for muscle spasms.    Dispense:  30 tablet    Refill:  1    Follow-up: Return in about 3 months (around 09/13/2023) for Chronic medical conditions.       Hoy Register, MD, FAAFP. [facility] [place], [place] [phone]   06/15/2023, 3:23 PM

## 2023-06-16 ENCOUNTER — Telehealth: Payer: Self-pay | Admitting: Critical Care Medicine

## 2023-06-16 ENCOUNTER — Telehealth: Payer: Self-pay | Admitting: Internal Medicine

## 2023-06-16 DIAGNOSIS — N1831 Chronic kidney disease, stage 3a: Secondary | ICD-10-CM

## 2023-06-16 LAB — CMP14+EGFR
ALT: 48 [IU]/L — ABNORMAL HIGH (ref 0–44)
AST: 72 [IU]/L — ABNORMAL HIGH (ref 0–40)
Albumin: 3.7 g/dL — ABNORMAL LOW (ref 3.9–4.9)
Alkaline Phosphatase: 96 [IU]/L (ref 44–121)
BUN/Creatinine Ratio: 13 (ref 10–24)
BUN: 18 mg/dL (ref 8–27)
Bilirubin Total: 1 mg/dL (ref 0.0–1.2)
CO2: 24 mmol/L (ref 20–29)
Calcium: 9.7 mg/dL (ref 8.6–10.2)
Chloride: 107 mmol/L — ABNORMAL HIGH (ref 96–106)
Creatinine, Ser: 1.4 mg/dL — ABNORMAL HIGH (ref 0.76–1.27)
Globulin, Total: 3.6 g/dL (ref 1.5–4.5)
Glucose: 89 mg/dL (ref 70–99)
Potassium: 4.8 mmol/L (ref 3.5–5.2)
Sodium: 142 mmol/L (ref 134–144)
Total Protein: 7.3 g/dL (ref 6.0–8.5)
eGFR: 56 mL/min/{1.73_m2} — ABNORMAL LOW (ref >59)

## 2023-06-16 NOTE — Telephone Encounter (Signed)
Patient has called and was questioning  about a referral that was supposed to be sent over to a physical therapist. Patient states he has not heard anything and wanted to check on this. Please advise. Patient saw Dr Alvis Lemmings yesterday 06/15/2023.  Patients callback #  [phone]

## 2023-06-16 NOTE — Telephone Encounter (Signed)
Pt being seen by [facility]. Per last EGD with banding report in August  2024 pt to have repeat EGD in 1 year. Recall entered for 02/2024 and pt aware.

## 2023-06-16 NOTE — Addendum Note (Signed)
Addended by: Hoy Register on: 06/16/2023 05:33 PM   Modules accepted: Orders

## 2023-06-16 NOTE — Telephone Encounter (Signed)
Patient called and stated he was seen by his PCP they advise him that he needed to be seen by the patient GI provider due to him having low kidney function. I was able to schedule patient for March 19th and 10:10 am. Please advise

## 2023-06-16 NOTE — Telephone Encounter (Signed)
See note below from PCP regarding labs drawn yesterday:  Hello, Kidney function shows a decline; avoid medications like Ibuprofen, Aleve which can worsen kidney function.  Your liver enzymes are elevated but stable compared to previous labs.  Please continue to follow-up with your GI doctor. -Dr. Alvis Lemmings  Written by Hoy Register, MD on 06/16/19 24  2:07 PM EST  Pt to keep scheduled OV.

## 2023-06-16 NOTE — Telephone Encounter (Signed)
Referral to Nephrology has been placed. He just had a visit with me yesterday and I placed Physical Therapy referral then. Please see his chart and update him regarding this. Thanks.

## 2023-06-16 NOTE — Telephone Encounter (Signed)
Pt called, seen results notes and pt states that he is concerned about kidney function. Pt states he doesn't take any Ibuprofen or Aleve. He drinks 3-4 bottles of water/ day. Pt is wanting to know what help he can do to help with kidney function or does he need a referral. Advised I would send message to provider for further recommendations.

## 2023-06-17 ENCOUNTER — Encounter: Payer: Self-pay | Admitting: Family Medicine

## 2023-06-17 NOTE — Telephone Encounter (Signed)
Patient is aware that referrals have been placed.

## 2023-06-23 ENCOUNTER — Telehealth: Payer: Self-pay | Admitting: Internal Medicine

## 2023-06-23 NOTE — Telephone Encounter (Signed)
noted 

## 2023-06-23 NOTE — Telephone Encounter (Signed)
 Copied from CRM [id number]. Topic: General - Other >> Jun 23, 2023 12:20 PM Ja-Kwan M wrote: Reason for CRM: Pt stated that he is unable to do any type of work especially lifting due to psoriasis of the liver. Pt requests that Dr. Alvis Lemmings makes anyone who contacts her about him aware that he can not work due to this condition.

## 2023-06-23 NOTE — Telephone Encounter (Signed)
Inbound call from patient stating that his disability company may be calling out practice to get information on him. Patient just wanted to give [place] a heads up. Please advise.

## 2023-06-23 NOTE — Telephone Encounter (Signed)
Noted  

## 2023-06-24 ENCOUNTER — Telehealth: Payer: Self-pay

## 2023-06-24 NOTE — Telephone Encounter (Signed)
FYI Newlin   Patient was concerned about disability paperwork per [employer], I informed patient to call the social security office and have them send paperwork over, he stated that he didn't mention anything about the paperwork to [facility] and wasn't sure If she could fill it out , I told him we can set up a virtual visit to fill out paperwork. Pt in agreement and will call

## 2023-06-25 NOTE — Telephone Encounter (Signed)
Pt wanted to let us know there should be a disability form coming to the office. He wanted to let Dr. Rhea Belton know that it needs to say he cannot work due to his cirrhosis. FYI

## 2023-06-25 NOTE — Telephone Encounter (Signed)
Patient called stated he has a disability claim open they informed him they would be sending a form for Dr. Rhea Belton to fill out. He requested for it to notify them that he cannot work. Please advise.

## 2023-07-01 NOTE — Therapy (Signed)
OUTPATIENT PHYSICAL THERAPY LOWER EXTREMITY EVALUATION   Patient Name: Lawrnce [name] MRN: [id number] DOB:April 05, 1961, 62 y.o., male Today's Date: 07/02/2023  END OF SESSION:  PT End of Session - 07/02/23 1011     Visit Number 1    Number of Visits 17    Date for PT Re-Evaluation 08/27/23    Authorization Type [employer] MCD    PT Start Time 1000    PT Stop Time 1040    PT Time Calculation (min) 40 min    Activity Tolerance Patient tolerated treatment well    Behavior During Therapy WFL for tasks assessed/performed             Past Medical History:  Diagnosis Date   Autoimmune hepatitis (HCC)    Cirrhosis of liver (HCC)    Hypertension    Thrombocytopenia (HCC)    Past Surgical History:  Procedure Laterality Date   ESOPHAGEAL BANDING  10/20/2022   Procedure: ESOPHAGEAL BANDING;  Surgeon: Beverley Fiedler, MD;  Location: [facility] ENDOSCOPY;  Service: Gastroenterology;;   ESOPHAGEAL BANDING  11/11/2022   Procedure: ESOPHAGEAL BANDING;  Surgeon: Beverley Fiedler, MD;  Location: [facility] ENDOSCOPY;  Service: Gastroenterology;;   ESOPHAGEAL BANDING N/A 12/07/2022   Procedure: ESOPHAGEAL BANDING;  Surgeon: Hilarie Fredrickson, MD;  Location: [facility] ENDOSCOPY;  Service: Gastroenterology;  Laterality: N/A;   ESOPHAGOGASTRODUODENOSCOPY N/A 10/20/2022   Procedure: ESOPHAGOGASTRODUODENOSCOPY (EGD);  Surgeon: Beverley Fiedler, MD;  Location: [facility] ENDOSCOPY;  Service: Gastroenterology;  Laterality: N/A;  per ICU   ESOPHAGOGASTRODUODENOSCOPY (EGD) WITH PROPOFOL N/A 11/11/2022   Procedure: ESOPHAGOGASTRODUODENOSCOPY (EGD) WITH PROPOFOL;  Surgeon: Beverley Fiedler, MD;  Location: [facility] ENDOSCOPY;  Service: Gastroenterology;  Laterality: N/A;   ESOPHAGOGASTRODUODENOSCOPY (EGD) WITH PROPOFOL N/A 12/07/2022   Procedure: ESOPHAGOGASTRODUODENOSCOPY (EGD) WITH PROPOFOL;  Surgeon: Hilarie Fredrickson, MD;  Location: [facility] ENDOSCOPY;  Service: Gastroenterology;  Laterality: N/A;   ESOPHAGOGASTRODUODENOSCOPY (EGD) WITH PROPOFOL N/A 03/01/2023   Procedure:  ESOPHAGOGASTRODUODENOSCOPY (EGD) WITH PROPOFOL;  Surgeon: Beverley Fiedler, MD;  Location: [facility] ENDOSCOPY;  Service: Gastroenterology;  Laterality: N/A;   Patient Active Problem List   Diagnosis Date Noted   History of hepatitis C 01/05/2023   Esophageal varices with bleeding in diseases classified elsewhere (HCC) 10/20/2022   Portal hypertension with esophageal varices (HCC) 10/20/2022   Multiple benign nevi 01/27/2022   Thrombocytopenia (HCC) 10/24/2015   Liver cirrhosis (HCC) 10/24/2015   Autoimmune hepatitis (HCC) 03/28/2013   Essential hypertension 11/15/2012    PCP: Storm Frisk, MD  REFERRING PROVIDER: Hoy Register, MD  REFERRING DIAG: Right leg weakness [R29.898]   THERAPY DIAG:  Pain in right leg  Muscle weakness (generalized)  Other abnormalities of gait and mobility  Rationale for Evaluation and Treatment: Rehabilitation  ONSET DATE: 12/05/2022  SUBJECTIVE:   SUBJECTIVE STATEMENT:  Patient states that he was diagnosed with cirrhosis of the liver in April, after which he had a fall. Then, he started noticing problems with his left leg in June. He states that he is having muscle cramps in the middle of the night, including on the top of his foot. He is also feeling like his right leg is "just hanging on" or "limber" after about 1 hours of standing/walking. He has states that he doesn't have a problem driving, but it is difficult to get his leg into the vehicle. He does also recall twisting his right ankle around the same time that he noticed to lower extremity weakness.    PERTINENT HISTORY: Relevant PMHx includes autoimmune hepatitis and cirrhosis  of liver, HTN, thrombocytopenia  PAIN:  Are you having pain? Yes: NPRS scale: 0-7/10 Pain location: R LE Pain description: "hanging on", "limber"   Aggravating factors: prolonged walking, getting in/out of the car Relieving factors: rest  PRECAUTIONS: Fall  RED FLAGS: None   WEIGHT BEARING RESTRICTIONS:  No  FALLS:  Has patient fallen in last 6 months? Yes. Number of falls a few while trying to run in the backyard   LIVING ENVIRONMENT: Lives with:  "I've got a room but I live with someone"  Lives in: House/apartment Stairs: No Has following equipment at home: Single point cane, Walker - 4 wheeled, and Grab bars  OCCUPATION: n/a  PLOF: Independent  PATIENT GOALS: To be able to walk longer without pain/difficulty.   NEXT MD VISIT: 09/13/22  OBJECTIVE:  Note: Objective measures were completed at Evaluation unless otherwise noted.  DIAGNOSTIC FINDINGS:   no relevant imaging results available in epic; none included in referral   PATIENT SURVEYS:  FOTO 55 current, 70 predicted  COGNITION: Overall cognitive status: Within functional limits for tasks assessed     SENSATION: Not tested   LOWER EXTREMITY MMT:  MMT Right eval Left eval  Hip flexion 3 4+  Hip extension 2 3+  Hip abduction 3 4-  Hip adduction 4- 4-  Hip internal rotation 4 4  Hip external rotation 4 4  Knee flexion 4- 4  Knee extension 4 4+  Ankle dorsiflexion 4 4+  Ankle plantarflexion    Ankle inversion    Ankle eversion     (Blank rows = not tested)   FUNCTIONAL TESTS:  2 minute walk test: 212ft MCTSIB: Condition 1: Avg of 3 trials: 30 sec, Condition 2: Avg of 3 trials: 30 sec, Condition 3: Avg of 3 trials: 30 sec, Condition 4: Avg of 3 trials: 27 sec, and Total Score: 117/120  GAIT: Distance walked: see Assistive device utilized: Single point cane on Rt Level of assistance: Modified independence Comments: increased R hip ER, increased R toe out, diminished L step length and gait speed                                                                                                                                TREATMENT DATE:   [facility] Adult PT Treatment:                                                DATE: 07/02/2023   Initial evaluation: see patient education and home exercise  program as noted below      PATIENT EDUCATION:  Education details: reviewed initial home exercise program; discussion of POC, prognosis and goals for skilled PT; correct usage of SPC (L UE)  Person educated: Patient Education method: Explanation, Demonstration, and Handouts Education comprehension: verbalized understanding, returned demonstration, and  needs further education  HOME EXERCISE PROGRAM: Access Code: [email or web address] URL: [email or web address] Date: 07/02/2023 Prepared by: Mauri Reading  Exercises - Seated Hip Abduction with Resistance  - 2 x daily - 7 x weekly - 2 sets - 10 reps - 3 sec hold - Seated Hip Adduction Isometrics with Ball  - 2 x daily - 7 x weekly - 2 sets - 10 reps - Seated Knee Extension with Resistance  - 2 x daily - 7 x weekly - 2 sets - 10 reps - Seated March with Resistance  - 2 x daily - 7 x weekly - 2 sets - 10 reps  ASSESSMENT:  CLINICAL IMPRESSION: Harlo is a 61 y.o. male who was seen today for physical therapy evaluation and treatment for Right Lower Extremity Weakness. He is demonstrating decreased LE MMT scores, decreased static standing balance, and altered gait mechanics. He has related pain and difficulty with prolonged walking, running car transfers and navigating uneven surfaces. He requires skilled PT services at this time to address relevant deficits and improve overall function.     OBJECTIVE IMPAIRMENTS: Abnormal gait, decreased activity tolerance, decreased balance, decreased mobility, decreased strength, increased muscle spasms, improper body mechanics, and pain.   ACTIVITY LIMITATIONS: squatting, stairs, transfers, dressing, locomotion level, and car transfers  PARTICIPATION LIMITATIONS: cleaning, shopping, community activity, occupation, and yard work  PERSONAL FACTORS: Past/current experiences, Time since onset of injury/illness/exacerbation, and 1-2 comorbidities: Relevant PMHx includes autoimmune hepatitis and cirrhosis of  liver, HTN, thrombocytopenia  are also affecting patient's functional outcome.   REHAB POTENTIAL: Good  CLINICAL DECISION MAKING: Stable/uncomplicated  EVALUATION COMPLEXITY: Low   GOALS: Goals reviewed with patient? Yes  SHORT TERM GOALS: Target date: 07/30/2023   Patient will be independent with initial home program for LE strengthening.  Baseline: provided at eval  Goal status: INITIAL  2.  Patient will demonstrate ability to safely ambulate throughout the clinic using SPC in left UE without LOB.  Baseline: using SPC in right UE Goal status: INITIAL   LONG TERM GOALS: Target date: 08/27/2023   Patient will report improved overall functional ability with FOTO score of 70 or greater.  Baseline: 55 Goal status: INITIAL  2.  Patient will improved distance to 229ft or greater.  Baseline: 235 ft Goal status: INITIAL  3.  Patient will demonstrate ability to navigate uneven surfaces and obstacles while walking using LRAD and ability to safely recover during instances of LOB.  Baseline: limited static standing balance on uneven surfaces with EC Goal status: INITIAL  4.  Patient will report ability to perform car transfers with minimal-to-no pain or difficulty.  Baseline: moderate-to-severe difficulty getting R LE into his vehicle  Goal status: INITIAL  5.  Patient will demonstrate at least 4/5 MMT with R LE strength testing in order to improve overall function.  Baseline: see objective measures Goal status: INITIAL     PLAN:  PT FREQUENCY: 1-2x/week  PT DURATION: 8 weeks  PLANNED INTERVENTIONS: 97164- PT Re-evaluation, 97110-Therapeutic exercises, 97530- Therapeutic activity, 97112- Neuromuscular re-education, 97535- Self Care, 28413- Manual therapy, [id number]- Aquatic Therapy, 97014- Electrical stimulation (unattended), [phone]- Electrical stimulation (manual), Patient/Family education, Balance training, Taping, Dry Needling, Joint mobilization, Cryotherapy, and Moist  heat  PLAN FOR NEXT SESSION: continue with LE Strength, navigating inclined/uneven surfaces, strengthening for car transfers, ambulation activities to improve safety and confidence   Mauri Reading, PT, DPT    07/02/2023, 11:16 AM    For all possible CPT codes, reference the Planned Interventions line above.  Check all conditions that are expected to impact treatment: {Conditions expected to impact treatment:Social determinants of health   If treatment provided at initial evaluation, no treatment charged due to lack of authorization.

## 2023-07-02 ENCOUNTER — Ambulatory Visit: Payer: Medicaid Other | Attending: Family Medicine

## 2023-07-02 ENCOUNTER — Other Ambulatory Visit: Payer: Self-pay

## 2023-07-02 DIAGNOSIS — M79604 Pain in right leg: Secondary | ICD-10-CM | POA: Insufficient documentation

## 2023-07-02 DIAGNOSIS — R2689 Other abnormalities of gait and mobility: Secondary | ICD-10-CM | POA: Insufficient documentation

## 2023-07-02 DIAGNOSIS — R29898 Other symptoms and signs involving the musculoskeletal system: Secondary | ICD-10-CM | POA: Insufficient documentation

## 2023-07-02 DIAGNOSIS — M6281 Muscle weakness (generalized): Secondary | ICD-10-CM | POA: Insufficient documentation

## 2023-07-06 NOTE — Telephone Encounter (Signed)
Spoke with pt and let him know we have not gotten a form for disability. Pt states it may be a few weeks before it comes or they may not contact us at all, he is not sure. Let him know we will fill the form out if we receive it. He verbalized understanding.

## 2023-07-06 NOTE — Telephone Encounter (Signed)
I have still not seen any disability paperwork for this patient JMP

## 2023-07-08 ENCOUNTER — Other Ambulatory Visit: Payer: Self-pay

## 2023-07-09 ENCOUNTER — Other Ambulatory Visit: Payer: Self-pay

## 2023-07-12 NOTE — Telephone Encounter (Signed)
 Inbound call from patient, requesting to speak with a nurse, following up on disability paperwork.

## 2023-07-13 DIAGNOSIS — K7460 Unspecified cirrhosis of liver: Secondary | ICD-10-CM | POA: Diagnosis not present

## 2023-07-13 DIAGNOSIS — K754 Autoimmune hepatitis: Secondary | ICD-10-CM | POA: Diagnosis not present

## 2023-07-13 DIAGNOSIS — R188 Other ascites: Secondary | ICD-10-CM | POA: Diagnosis not present

## 2023-07-13 DIAGNOSIS — I8500 Esophageal varices without bleeding: Secondary | ICD-10-CM | POA: Diagnosis not present

## 2023-07-13 NOTE — Telephone Encounter (Signed)
 In order to write a statement of health letter he will need to be seen again in the office Last seen > 6 months ago Once seen and if agreement then such a letter can be provided [facility]

## 2023-07-13 NOTE — Telephone Encounter (Signed)
 Patient called and states he needs Dr. Rhea Belton to send his lawyer a letter stating why he cannot work. The letter needs to be sent to:  Pollyann Savoy  [address] [address]  [place] [address]  Phone [phone] Fax [phone]

## 2023-07-13 NOTE — Telephone Encounter (Signed)
 Pt scheduled to see Dr. Terri Piedra 07/15/23 at 8:30am, pt aware of appt.

## 2023-07-14 ENCOUNTER — Ambulatory Visit: Payer: Medicaid Other | Attending: Family Medicine

## 2023-07-14 DIAGNOSIS — M6281 Muscle weakness (generalized): Secondary | ICD-10-CM | POA: Diagnosis present

## 2023-07-14 DIAGNOSIS — M79604 Pain in right leg: Secondary | ICD-10-CM | POA: Diagnosis present

## 2023-07-14 DIAGNOSIS — R2689 Other abnormalities of gait and mobility: Secondary | ICD-10-CM | POA: Diagnosis present

## 2023-07-14 NOTE — Therapy (Signed)
 OUTPATIENT PHYSICAL THERAPY TREATMENT NOTE   Patient Name: Timothy [name] MRN: [id number] DOB:08-Apr-1961, 63 y.o., male Today's Date: 07/14/2023  END OF SESSION:  PT End of Session - 07/14/23 0954     Visit Number 2    Number of Visits 17    Date for PT Re-Evaluation 08/27/23    Authorization Type [employer] MCD    PT Start Time 1000    PT Stop Time 1040    PT Time Calculation (min) 40 min    Activity Tolerance Patient tolerated treatment well    Behavior During Therapy WFL for tasks assessed/performed             Past Medical History:  Diagnosis Date   Autoimmune hepatitis (HCC)    Cirrhosis of liver (HCC)    Hypertension    Thrombocytopenia (HCC)    Past Surgical History:  Procedure Laterality Date   ESOPHAGEAL BANDING  10/20/2022   Procedure: ESOPHAGEAL BANDING;  Surgeon: Albertus Gordy HERO, MD;  Location: [facility] ENDOSCOPY;  Service: Gastroenterology;;   ESOPHAGEAL BANDING  11/11/2022   Procedure: ESOPHAGEAL BANDING;  Surgeon: Albertus Gordy HERO, MD;  Location: [facility] ENDOSCOPY;  Service: Gastroenterology;;   ESOPHAGEAL BANDING N/A 12/07/2022   Procedure: ESOPHAGEAL BANDING;  Surgeon: Abran Norleen SAILOR, MD;  Location: [facility] ENDOSCOPY;  Service: Gastroenterology;  Laterality: N/A;   ESOPHAGOGASTRODUODENOSCOPY N/A 10/20/2022   Procedure: ESOPHAGOGASTRODUODENOSCOPY (EGD);  Surgeon: Albertus Gordy HERO, MD;  Location: [facility] ENDOSCOPY;  Service: Gastroenterology;  Laterality: N/A;  per ICU   ESOPHAGOGASTRODUODENOSCOPY (EGD) WITH PROPOFOL  N/A 11/11/2022   Procedure: ESOPHAGOGASTRODUODENOSCOPY (EGD) WITH PROPOFOL ;  Surgeon: Albertus Gordy HERO, MD;  Location: [facility] ENDOSCOPY;  Service: Gastroenterology;  Laterality: N/A;   ESOPHAGOGASTRODUODENOSCOPY (EGD) WITH PROPOFOL  N/A 12/07/2022   Procedure: ESOPHAGOGASTRODUODENOSCOPY (EGD) WITH PROPOFOL ;  Surgeon: Abran Norleen SAILOR, MD;  Location: [facility] ENDOSCOPY;  Service: Gastroenterology;  Laterality: N/A;   ESOPHAGOGASTRODUODENOSCOPY (EGD) WITH PROPOFOL  N/A 03/01/2023   Procedure:  ESOPHAGOGASTRODUODENOSCOPY (EGD) WITH PROPOFOL ;  Surgeon: Albertus Gordy HERO, MD;  Location: [facility] ENDOSCOPY;  Service: Gastroenterology;  Laterality: N/A;   Patient Active Problem List   Diagnosis Date Noted   History of hepatitis C 01/05/2023   Esophageal varices with bleeding in diseases classified elsewhere (HCC) 10/20/2022   Portal hypertension with esophageal varices (HCC) 10/20/2022   Multiple benign nevi 01/27/2022   Thrombocytopenia (HCC) 10/24/2015   Liver cirrhosis (HCC) 10/24/2015   Autoimmune hepatitis (HCC) 03/28/2013   Essential hypertension 11/15/2012    PCP: Brien Belvie BRAVO, MD  REFERRING PROVIDER: Delbert Clam, MD  REFERRING DIAG: Right leg weakness [R29.898]   THERAPY DIAG:  Pain in right leg  Muscle weakness (generalized)  Other abnormalities of gait and mobility  Rationale for Evaluation and Treatment: Rehabilitation  ONSET DATE: 12/05/2022  SUBJECTIVE:   SUBJECTIVE STATEMENT: Patient reports no current pain, no new falls, 2x daily compliance with HEP.   EVAL: Patient states that he was diagnosed with cirrhosis of the liver in April, after which he had a fall. Then, he started noticing problems with his left leg in June. He states that he is having muscle cramps in the middle of the night, including on the top of his foot. He is also feeling like his right leg is just hanging on or limber after about 1 hours of standing/walking. He has states that he doesn't have a problem driving, but it is difficult to get his leg into the vehicle. He does also recall twisting his right ankle around the same time that he noticed to lower  extremity weakness.   PERTINENT HISTORY: Relevant PMHx includes autoimmune hepatitis and cirrhosis of liver, HTN, thrombocytopenia  PAIN:  Are you having pain? Yes: NPRS scale: 0-7/10 Pain location: R LE Pain description: hanging on, limber   Aggravating factors: prolonged walking, getting in/out of the car Relieving factors:  rest  PRECAUTIONS: Fall  RED FLAGS: None   WEIGHT BEARING RESTRICTIONS: No  FALLS:  Has patient fallen in last 6 months? Yes. Number of falls a few while trying to run in the backyard   LIVING ENVIRONMENT: Lives with:  I've got a room but I live with someone  Lives in: House/apartment Stairs: No Has following equipment at home: Single point cane, Walker - 4 wheeled, and Grab bars  OCCUPATION: n/a  PLOF: Independent  PATIENT GOALS: To be able to walk longer without pain/difficulty.   NEXT MD VISIT: 09/13/22  OBJECTIVE:  Note: Objective measures were completed at Evaluation unless otherwise noted.  DIAGNOSTIC FINDINGS:   no relevant imaging results available in epic; none included in referral   PATIENT SURVEYS:  FOTO 55 current, 70 predicted  COGNITION: Overall cognitive status: Within functional limits for tasks assessed     SENSATION: Not tested   LOWER EXTREMITY MMT:  MMT Right eval Left eval  Hip flexion 3 4+  Hip extension 2 3+  Hip abduction 3 4-  Hip adduction 4- 4-  Hip internal rotation 4 4  Hip external rotation 4 4  Knee flexion 4- 4  Knee extension 4 4+  Ankle dorsiflexion 4 4+  Ankle plantarflexion    Ankle inversion    Ankle eversion     (Blank rows = not tested)   FUNCTIONAL TESTS:  2 minute walk test: 224ft MCTSIB: Condition 1: Avg of 3 trials: 30 sec, Condition 2: Avg of 3 trials: 30 sec, Condition 3: Avg of 3 trials: 30 sec, Condition 4: Avg of 3 trials: 27 sec, and Total Score: 117/120  GAIT: Distance walked: see Assistive device utilized: Single point cane on Rt Level of assistance: Modified independence Comments: increased R hip ER, increased R toe out, diminished L step length and gait speed                                                                                                                                TREATMENT DATE:  [facility] Adult PT Treatment:                                                DATE:  07/14/23 Therapeutic Exercise: Nustep level 3 x 8 mins Standing heel/toe raises 2x10 Standing hip abduction x10 BIL (cues for form and pacing) Standing marching 2x30 (cues for form and pacing) Seated marching 2x30 LAQ 3 hold at top 2x10 BIL Seated hip adduction 5 hold 2x10 Seated knee flexion  GTB 2x10 BIL SLR 2x10 BIL Bridges 2x10   [facility] Adult PT Treatment:                                                DATE: 07/02/2023   Initial evaluation: see patient education and home exercise program as noted below      PATIENT EDUCATION:  Education details: reviewed initial home exercise program; discussion of POC, prognosis and goals for skilled PT; correct usage of SPC (L UE)  Person educated: Patient Education method: Explanation, Demonstration, and Handouts Education comprehension: verbalized understanding, returned demonstration, and needs further education  HOME EXERCISE PROGRAM: Access Code: [email or web address] URL: [email or web address] Date: 07/02/2023 Prepared by: Marko Molt  Exercises - Seated Hip Abduction with Resistance  - 2 x daily - 7 x weekly - 2 sets - 10 reps - 3 sec hold - Seated Hip Adduction Isometrics with Ball  - 2 x daily - 7 x weekly - 2 sets - 10 reps - Seated Knee Extension with Resistance  - 2 x daily - 7 x weekly - 2 sets - 10 reps - Seated March with Resistance  - 2 x daily - 7 x weekly - 2 sets - 10 reps  ASSESSMENT:  CLINICAL IMPRESSION: Patient presents to first follow up PT session reporting no current pain and that he has been compliant with his HEP 2x a day. Session today focused on LE strengthening to improve overall function. Patient was able to tolerate all prescribed exercises with no adverse effects. He does require cues for form and pacing during session. Patient continues to benefit from skilled PT services and should be progressed as able to improve functional independence.   EVAL: Timothy [name] is a 63 y.o. male who was seen today for  physical therapy evaluation and treatment for Right Lower Extremity Weakness. He is demonstrating decreased LE MMT scores, decreased static standing balance, and altered gait mechanics. He has related pain and difficulty with prolonged walking, running car transfers and navigating uneven surfaces. He requires skilled PT services at this time to address relevant deficits and improve overall function.     OBJECTIVE IMPAIRMENTS: Abnormal gait, decreased activity tolerance, decreased balance, decreased mobility, decreased strength, increased muscle spasms, improper body mechanics, and pain.   ACTIVITY LIMITATIONS: squatting, stairs, transfers, dressing, locomotion level, and car transfers  PARTICIPATION LIMITATIONS: cleaning, shopping, community activity, occupation, and yard work  PERSONAL FACTORS: Past/current experiences, Time since onset of injury/illness/exacerbation, and 1-2 comorbidities: Relevant PMHx includes autoimmune hepatitis and cirrhosis of liver, HTN, thrombocytopenia  are also affecting patient's functional outcome.   REHAB POTENTIAL: Good  CLINICAL DECISION MAKING: Stable/uncomplicated  EVALUATION COMPLEXITY: Low   GOALS: Goals reviewed with patient? Yes  SHORT TERM GOALS: Target date: 07/30/2023   Patient will be independent with initial home program for LE strengthening.  Baseline: provided at eval  Goal status: INITIAL  2.  Patient will demonstrate ability to safely ambulate throughout the clinic using SPC in left UE without LOB.  Baseline: using SPC in right UE Goal status: INITIAL   LONG TERM GOALS: Target date: 08/27/2023   Patient will report improved overall functional ability with FOTO score of 70 or greater.  Baseline: 55 Goal status: INITIAL  2.  Patient will improved distance to 252ft or greater.  Baseline: 235 ft Goal status: INITIAL  3.  Patient will demonstrate ability  to navigate uneven surfaces and obstacles while walking using LRAD and  ability to safely recover during instances of LOB.  Baseline: limited static standing balance on uneven surfaces with EC Goal status: INITIAL  4.  Patient will report ability to perform car transfers with minimal-to-no pain or difficulty.  Baseline: moderate-to-severe difficulty getting R LE into his vehicle  Goal status: INITIAL  5.  Patient will demonstrate at least 4/5 MMT with R LE strength testing in order to improve overall function.  Baseline: see objective measures Goal status: INITIAL     PLAN:  PT FREQUENCY: 1-2x/week  PT DURATION: 8 weeks  PLANNED INTERVENTIONS: 97164- PT Re-evaluation, 97110-Therapeutic exercises, 97530- Therapeutic activity, 97112- Neuromuscular re-education, 97535- Self Care, 02859- Manual therapy, [id number]- Aquatic Therapy, 97014- Electrical stimulation (unattended), [phone]- Electrical stimulation (manual), Patient/Family education, Balance training, Taping, Dry Needling, Joint mobilization, Cryotherapy, and Moist heat  PLAN FOR NEXT SESSION: continue with LE Strength, navigating inclined/uneven surfaces, strengthening for car transfers, ambulation activities to improve safety and confidence   Corean Pouch PTA  07/14/2023, 10:48 AM

## 2023-07-15 ENCOUNTER — Ambulatory Visit (INDEPENDENT_AMBULATORY_CARE_PROVIDER_SITE_OTHER): Payer: Medicaid Other | Admitting: Internal Medicine

## 2023-07-15 ENCOUNTER — Other Ambulatory Visit (INDEPENDENT_AMBULATORY_CARE_PROVIDER_SITE_OTHER): Payer: Medicaid Other

## 2023-07-15 ENCOUNTER — Telehealth: Payer: Self-pay

## 2023-07-15 ENCOUNTER — Encounter: Payer: Self-pay | Admitting: Internal Medicine

## 2023-07-15 ENCOUNTER — Other Ambulatory Visit: Payer: Self-pay

## 2023-07-15 VITALS — BP 110/70 | HR 66 | Ht 74.0 in | Wt 198.0 lb

## 2023-07-15 DIAGNOSIS — K7460 Unspecified cirrhosis of liver: Secondary | ICD-10-CM

## 2023-07-15 DIAGNOSIS — R188 Other ascites: Secondary | ICD-10-CM

## 2023-07-15 DIAGNOSIS — K551 Chronic vascular disorders of intestine: Secondary | ICD-10-CM

## 2023-07-15 DIAGNOSIS — K8020 Calculus of gallbladder without cholecystitis without obstruction: Secondary | ICD-10-CM

## 2023-07-15 DIAGNOSIS — R161 Splenomegaly, not elsewhere classified: Secondary | ICD-10-CM | POA: Diagnosis not present

## 2023-07-15 DIAGNOSIS — R7401 Elevation of levels of liver transaminase levels: Secondary | ICD-10-CM

## 2023-07-15 DIAGNOSIS — K7290 Hepatic failure, unspecified without coma: Secondary | ICD-10-CM | POA: Diagnosis not present

## 2023-07-15 DIAGNOSIS — K754 Autoimmune hepatitis: Secondary | ICD-10-CM | POA: Diagnosis not present

## 2023-07-15 DIAGNOSIS — I8511 Secondary esophageal varices with bleeding: Secondary | ICD-10-CM

## 2023-07-15 LAB — CBC WITH DIFFERENTIAL/PLATELET
Basophils Absolute: 0 10*3/uL (ref 0.0–0.1)
Basophils Relative: 1 % (ref 0.0–3.0)
Eosinophils Absolute: 0.2 10*3/uL (ref 0.0–0.7)
Eosinophils Relative: 6.5 % — ABNORMAL HIGH (ref 0.0–5.0)
HCT: 38.4 % — ABNORMAL LOW (ref 39.0–52.0)
Hemoglobin: 12.7 g/dL — ABNORMAL LOW (ref 13.0–17.0)
Lymphocytes Relative: 25.7 % (ref 12.0–46.0)
Lymphs Abs: 0.7 10*3/uL (ref 0.7–4.0)
MCHC: 33 g/dL (ref 30.0–36.0)
MCV: 93.9 fL (ref 78.0–100.0)
Monocytes Absolute: 0.4 10*3/uL (ref 0.1–1.0)
Monocytes Relative: 13.9 % — ABNORMAL HIGH (ref 3.0–12.0)
Neutro Abs: 1.5 10*3/uL (ref 1.4–7.7)
Neutrophils Relative %: 52.9 % (ref 43.0–77.0)
Platelets: 62 10*3/uL — ABNORMAL LOW (ref 150.0–400.0)
RBC: 4.09 Mil/uL — ABNORMAL LOW (ref 4.22–5.81)
RDW: 16.9 % — ABNORMAL HIGH (ref 11.5–15.5)
WBC: 2.9 10*3/uL — ABNORMAL LOW (ref 4.0–10.5)

## 2023-07-15 LAB — PROTIME-INR
INR: 1.3 {ratio} — ABNORMAL HIGH (ref 0.8–1.0)
Prothrombin Time: 13.6 s — ABNORMAL HIGH (ref 9.6–13.1)

## 2023-07-15 LAB — COMPREHENSIVE METABOLIC PANEL
ALT: 43 U/L (ref 0–53)
AST: 67 U/L — ABNORMAL HIGH (ref 0–37)
Albumin: 3.5 g/dL (ref 3.5–5.2)
Alkaline Phosphatase: 83 U/L (ref 39–117)
BUN: 24 mg/dL — ABNORMAL HIGH (ref 6–23)
CO2: 26 meq/L (ref 19–32)
Calcium: 9.5 mg/dL (ref 8.4–10.5)
Chloride: 108 meq/L (ref 96–112)
Creatinine, Ser: 1.28 mg/dL (ref 0.40–1.50)
GFR: 59.59 mL/min — ABNORMAL LOW (ref >60.00)
Glucose, Bld: 82 mg/dL (ref 70–99)
Potassium: 4.2 meq/L (ref 3.5–5.1)
Sodium: 141 meq/L (ref 135–145)
Total Bilirubin: 1.2 mg/dL (ref 0.2–1.2)
Total Protein: 7.6 g/dL (ref 6.0–8.3)

## 2023-07-15 MED ORDER — PREDNISONE 5 MG PO TABS
5.0000 mg | ORAL_TABLET | Freq: Every day | ORAL | 1 refills | Status: DC
  Filled 2023-07-15: qty 90, 90d supply, fill #0
  Filled 2023-08-05: qty 30, 30d supply, fill #0
  Filled 2023-09-06: qty 30, 30d supply, fill #1

## 2023-07-15 MED ORDER — AZATHIOPRINE 50 MG PO TABS
50.0000 mg | ORAL_TABLET | Freq: Every day | ORAL | 1 refills | Status: DC
  Filled 2023-07-15: qty 90, 90d supply, fill #0
  Filled 2023-08-11: qty 30, 30d supply, fill #0
  Filled 2023-09-06: qty 30, 30d supply, fill #1
  Filled 2023-10-12: qty 30, 30d supply, fill #2

## 2023-07-15 NOTE — Telephone Encounter (Signed)
 Routing to PCP for review.

## 2023-07-15 NOTE — Telephone Encounter (Signed)
 Copied from CRM [id number]. Topic: General - Inquiry >> Jul 15, 2023 10:48 AM Donnal HERO wrote: Reason for CRM: Pt is calling to follow up on a request for a virtual visit to fill out paperwork. With Dr. Newlin. Stated he is going through a disability claim and needs PCP to sign off and send his attorney a letter that he was unable to work because he has cirrhosis of the liver.  Stated his attorney is telling him that the doctor needs to send something in. Please advise.

## 2023-07-15 NOTE — Progress Notes (Signed)
 Subjective:    Patient ID: Timothy [name], male    DOB: 08-12-1960, 63 y.o.   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  He is here alone today.  He has followed with [facility] here in town and was seen recently.  At last EGD on 03/01/2023 his varices were proven eradicated.  The patient has been managing his autoimmune hepatitis with azathioprine  50 mg daily and low dose prednisone  5 mg daily. The patient reports improvement in his condition, particularly noting a reduction in fluid accumulation. However, he has been experiencing issues with his right leg, describing it as feeling limber and like a piece of meat just hanging. The patient has not sought medical attention for this issue.  The patient has been taking half of his prescribed fluid pill, spironolactone  50 mg, and has stopped taking furosemide . The patient reports no current swelling issues. The patient also takes carvedilol  twice daily for his liver condition.  The patient has been experiencing shortness of breath, weakness, and leg cramps, particularly at night. 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 The patient is currently in the process of applying for disability.  He believes that he has previously submitted a Cologuard test.  Review of Systems As per HPI, otherwise negative  Current Medications, Allergies, Past Medical History, Past Surgical History, Family History and Social History were reviewed in [employer] record.    Objective:   Physical Exam BP 110/70   Pulse 66   Ht 6' 2 (1.88 m)   Wt 198 lb (89.8 kg)   BMI 25.42 kg/m  Gen: awake, alert, NAD HEENT: anicteric  CV: RRR, no mrg Pulm: CTA b/l Abd:  soft, NT/ND, +BS throughout Ext: no c/c/e Neuro: nonfocal, no asterixis  LABS AST: 72 (06/15/2023) ALT: 48 (06/15/2023) Total bilirubin: 1.0 (06/15/2023) Alkaline phosphatase: 96 (06/15/2023) Albumin: 3.7 (06/15/2023) Serum creatinine: 1.4 (06/15/2023)  RADIOLOGY CT abdomen with contrast: Cirrhosis without suspicious liver lesions, non-occlusive thrombus in the central superior mesenteric vein and portal confluence, large esophageal varices, mild splenomegaly, cholelithiasis (02/10/2023)  DIAGNOSTIC Endoscopy: Post banding scars and eradication of varices (03/01/2023)      Assessment & Plan:  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 Recent imaging showed cirrhosis without suspicious liver lesions, non-occlusive thrombus in the central superior mesenteric vein and portal confluence, large esophageal varices, mild splenomegaly and cholelithiasis. Recent endoscopy showed eradication of varices.  He does have minor elevation in serum AST and ALT -Continue Azathioprine  50mg  daily and Prednisone  5mg  daily. -Plan for repeat upper endoscopy and CT abdomen in August 2025 for surveillance. -Repeat CBC, CMP, INR and IgG today -Continue follow-up at [facility]  Ascites Improved with Spironolactone  50mg  daily. Patient has stopped Furosemide  due to concerns about hypotension. -Continue Spironolactone  50mg  daily. -Check labs today including potassium and INR. -Advise low sodium diet and weight monitoring at home. If weight increases by more than 10 pounds, patient to contact clinic.  Right leg numbness New symptom, unclear etiology. -Recommend evaluation for this new symptom with PCP.  Disability application Patient reports significant functional impairment due to cirrhosis, including shortness of breath, weakness, and leg numbness. -Write letter to patient's lawyer detailing functional limitations and  medical  conditions.  Colon cancer screening Reports previous Cologuard.  We will try to obtain these records and if it has been longer than 3 years this is recommended versus colonoscopy.  Follow-up in 6 months or sooner if weight increases by more than 10 pounds.

## 2023-07-15 NOTE — Telephone Encounter (Signed)
 Please schedule an appointment for him after we have received what ever form needs to be completed.

## 2023-07-15 NOTE — Patient Instructions (Signed)
 Your provider has requested that you go to the basement level for lab work before leaving today. Press B on the elevator. The lab is located at the first door on the left as you exit the elevator.  We have sent the following medications to your pharmacy for you to pick up at your convenience:azathioprine  and prednisone .  Remain on a low sodium diet.  _______________________________________________________  If your blood pressure at your visit was 140/90 or greater, please contact your primary care physician to follow up on this.  _______________________________________________________  If you are age 78 or older, your body mass index should be between 23-30. Your Body mass index is 25.42 kg/m. If this is out of the aforementioned range listed, please consider follow up with your Primary Care Provider.  If you are age 66 or younger, your body mass index should be between 19-25. Your Body mass index is 25.42 kg/m. If this is out of the aformentioned range listed, please consider follow up with your Primary Care Provider.   ________________________________________________________  The [facility] providers would like to encourage you to use MYCHART to communicate with providers for non-urgent requests or questions.  Due to long hold times on the telephone, sending your provider a message by [facility] may be a faster and more efficient way to get a response.  Please allow 48 business hours for a response.  Please remember that this is for non-urgent requests.  _______________________________________________________

## 2023-07-16 ENCOUNTER — Ambulatory Visit: Payer: Medicaid Other | Admitting: Physical Therapy

## 2023-07-16 ENCOUNTER — Encounter: Payer: Self-pay | Admitting: Physical Therapy

## 2023-07-16 DIAGNOSIS — M79604 Pain in right leg: Secondary | ICD-10-CM

## 2023-07-16 DIAGNOSIS — M6281 Muscle weakness (generalized): Secondary | ICD-10-CM

## 2023-07-16 DIAGNOSIS — R2689 Other abnormalities of gait and mobility: Secondary | ICD-10-CM

## 2023-07-16 LAB — IGG: IgG (Immunoglobin G), Serum: 2491 mg/dL — ABNORMAL HIGH (ref 600–1540)

## 2023-07-16 NOTE — Telephone Encounter (Signed)
Patient has been schedule for 08/10/2022.

## 2023-07-16 NOTE — Therapy (Signed)
 OUTPATIENT PHYSICAL THERAPY TREATMENT NOTE   Patient Name: Timothy [name] MRN: [id number] DOB:May 04, 1961, 63 y.o., male Today's Date: 07/16/2023  END OF SESSION:  PT End of Session - 07/16/23 1129     Visit Number 3    Number of Visits 17    Date for PT Re-Evaluation 08/27/23    Authorization Type [employer] MCD    PT Start Time 1130    PT Stop Time 1212    PT Time Calculation (min) 42 min    Activity Tolerance Patient tolerated treatment well    Behavior During Therapy WFL for tasks assessed/performed             Past Medical History:  Diagnosis Date   Autoimmune hepatitis (HCC)    Cirrhosis of liver (HCC)    Hypertension    Thrombocytopenia (HCC)    Past Surgical History:  Procedure Laterality Date   ESOPHAGEAL BANDING  10/20/2022   Procedure: ESOPHAGEAL BANDING;  Surgeon: Albertus Gordy HERO, MD;  Location: [facility] ENDOSCOPY;  Service: Gastroenterology;;   ESOPHAGEAL BANDING  11/11/2022   Procedure: ESOPHAGEAL BANDING;  Surgeon: Albertus Gordy HERO, MD;  Location: [facility] ENDOSCOPY;  Service: Gastroenterology;;   ESOPHAGEAL BANDING N/A 12/07/2022   Procedure: ESOPHAGEAL BANDING;  Surgeon: Abran Norleen SAILOR, MD;  Location: [facility] ENDOSCOPY;  Service: Gastroenterology;  Laterality: N/A;   ESOPHAGOGASTRODUODENOSCOPY N/A 10/20/2022   Procedure: ESOPHAGOGASTRODUODENOSCOPY (EGD);  Surgeon: Albertus Gordy HERO, MD;  Location: [facility] ENDOSCOPY;  Service: Gastroenterology;  Laterality: N/A;  per ICU   ESOPHAGOGASTRODUODENOSCOPY (EGD) WITH PROPOFOL  N/A 11/11/2022   Procedure: ESOPHAGOGASTRODUODENOSCOPY (EGD) WITH PROPOFOL ;  Surgeon: Albertus Gordy HERO, MD;  Location: [facility] ENDOSCOPY;  Service: Gastroenterology;  Laterality: N/A;   ESOPHAGOGASTRODUODENOSCOPY (EGD) WITH PROPOFOL  N/A 12/07/2022   Procedure: ESOPHAGOGASTRODUODENOSCOPY (EGD) WITH PROPOFOL ;  Surgeon: Abran Norleen SAILOR, MD;  Location: [facility] ENDOSCOPY;  Service: Gastroenterology;  Laterality: N/A;   ESOPHAGOGASTRODUODENOSCOPY (EGD) WITH PROPOFOL  N/A 03/01/2023   Procedure:  ESOPHAGOGASTRODUODENOSCOPY (EGD) WITH PROPOFOL ;  Surgeon: Albertus Gordy HERO, MD;  Location: [facility] ENDOSCOPY;  Service: Gastroenterology;  Laterality: N/A;   Patient Active Problem List   Diagnosis Date Noted   History of hepatitis C 01/05/2023   Esophageal varices with bleeding in diseases classified elsewhere (HCC) 10/20/2022   Portal hypertension with esophageal varices (HCC) 10/20/2022   Multiple benign nevi 01/27/2022   Thrombocytopenia (HCC) 10/24/2015   Liver cirrhosis (HCC) 10/24/2015   Autoimmune hepatitis (HCC) 03/28/2013   Essential hypertension 11/15/2012    PCP: Brien Belvie BRAVO, MD  REFERRING PROVIDER: Delbert Clam, MD  REFERRING DIAG: Right leg weakness [R29.898]   THERAPY DIAG:  Pain in right leg  Muscle weakness (generalized)  Other abnormalities of gait and mobility  Rationale for Evaluation and Treatment: Rehabilitation  ONSET DATE: 12/05/2022  SUBJECTIVE:   SUBJECTIVE STATEMENT: Pt reports continued improvement.  He is now confident walking for short distance without his cane.  EVAL: Patient states that he was diagnosed with cirrhosis of the liver in April, after which he had a fall. Then, he started noticing problems with his left leg in June. He states that he is having muscle cramps in the middle of the night, including on the top of his foot. He is also feeling like his right leg is just hanging on or limber after about 1 hours of standing/walking. He has states that he doesn't have a problem driving, but it is difficult to get his leg into the vehicle. He does also recall twisting his right ankle around the same time that he noticed  to lower extremity weakness.   PERTINENT HISTORY: Relevant PMHx includes autoimmune hepatitis and cirrhosis of liver, HTN, thrombocytopenia  PAIN:  Are you having pain? Yes: NPRS scale: 0-7/10 Pain location: R LE Pain description: hanging on, limber   Aggravating factors: prolonged walking, getting in/out of the  car Relieving factors: rest  PRECAUTIONS: Fall  RED FLAGS: None   WEIGHT BEARING RESTRICTIONS: No  FALLS:  Has patient fallen in last 6 months? Yes. Number of falls a few while trying to run in the backyard   LIVING ENVIRONMENT: Lives with:  I've got a room but I live with someone  Lives in: House/apartment Stairs: No Has following equipment at home: Single point cane, Walker - 4 wheeled, and Grab bars  OCCUPATION: n/a  PLOF: Independent  PATIENT GOALS: To be able to walk longer without pain/difficulty.   NEXT MD VISIT: 09/13/22  OBJECTIVE:  Note: Objective measures were completed at Evaluation unless otherwise noted.  DIAGNOSTIC FINDINGS:   no relevant imaging results available in epic; none included in referral   PATIENT SURVEYS:  FOTO 55 current, 70 predicted  COGNITION: Overall cognitive status: Within functional limits for tasks assessed     SENSATION: Not tested   LOWER EXTREMITY MMT:  MMT Right eval Left eval  Hip flexion 3 4+  Hip extension 2 3+  Hip abduction 3 4-  Hip adduction 4- 4-  Hip internal rotation 4 4  Hip external rotation 4 4  Knee flexion 4- 4  Knee extension 4 4+  Ankle dorsiflexion 4 4+  Ankle plantarflexion    Ankle inversion    Ankle eversion     (Blank rows = not tested)   FUNCTIONAL TESTS:  2 minute walk test: 283ft MCTSIB: Condition 1: Avg of 3 trials: 30 sec, Condition 2: Avg of 3 trials: 30 sec, Condition 3: Avg of 3 trials: 30 sec, Condition 4: Avg of 3 trials: 27 sec, and Total Score: 117/120  GAIT: Distance walked: see Assistive device utilized: Single point cane on Rt Level of assistance: Modified independence Comments: increased R hip ER, increased R toe out, diminished L step length and gait speed                                                                                                                                TREATMENT DATE:   [facility] Adult PT Treatment:                                                 DATE: 07/16/23 Therapeutic Exercise: Bike 5' Standing heel/toe raises 2x10 Standing hip abduction 2x10 BIL (cues for form and pacing) Standing marching 2x15 (cues for form and pacing) LAQ 3 hold at top 3x10 BIL - 5# supine hip adduction with pilates ring 5 hold 2x10 Seated clam -  Black TB - 3x10 Seated knee flexion Black TB 2x10 BIL SLR 2x10 BIL Bridges 2x10 Sit to stand from raised table - 2x10  Consider: rocker board and step up  [facility] Adult PT Treatment:                                                DATE: 07/14/23 Therapeutic Exercise: Nustep level 3 x 8 mins Standing heel/toe raises 2x10 Standing hip abduction x10 BIL (cues for form and pacing) Standing marching 2x30 (cues for form and pacing) Seated marching 2x30 LAQ 3 hold at top 2x10 BIL Seated hip adduction 5 hold 2x10 Seated knee flexion GTB 2x10 BIL SLR 2x10 BIL Bridges 2x10   [facility] Adult PT Treatment:                                                DATE: 07/02/2023   Initial evaluation: see patient education and home exercise program as noted below      PATIENT EDUCATION:  Education details: reviewed initial home exercise program; discussion of POC, prognosis and goals for skilled PT; correct usage of SPC (L UE)  Person educated: Patient Education method: Explanation, Demonstration, and Handouts Education comprehension: verbalized understanding, returned demonstration, and needs further education  HOME EXERCISE PROGRAM: Access Code: [email or web address] URL: [email or web address] Date: 07/02/2023 Prepared by: Marko Molt  Exercises - Seated Hip Abduction with Resistance  - 2 x daily - 7 x weekly - 2 sets - 10 reps - 3 sec hold - Seated Hip Adduction Isometrics with Ball  - 2 x daily - 7 x weekly - 2 sets - 10 reps - Seated Knee Extension with Resistance  - 2 x daily - 7 x weekly - 2 sets - 10 reps - Seated March with Resistance  - 2 x daily - 7 x weekly - 2 sets - 10  reps  ASSESSMENT:  CLINICAL IMPRESSION: Timothy [name] tolerated session well with no adverse reaction.  Concentrated on progressing LE and hip exercises to good effect.  Reviewed HEP and provided black theraband to increase resistance at home.  Will consider progressing to more compound movements and balance as tolerated.  EVAL: Timothy [name] is a 63 y.o. male who was seen today for physical therapy evaluation and treatment for Right Lower Extremity Weakness. He is demonstrating decreased LE MMT scores, decreased static standing balance, and altered gait mechanics. He has related pain and difficulty with prolonged walking, running car transfers and navigating uneven surfaces. He requires skilled PT services at this time to address relevant deficits and improve overall function.     OBJECTIVE IMPAIRMENTS: Abnormal gait, decreased activity tolerance, decreased balance, decreased mobility, decreased strength, increased muscle spasms, improper body mechanics, and pain.   ACTIVITY LIMITATIONS: squatting, stairs, transfers, dressing, locomotion level, and car transfers  PARTICIPATION LIMITATIONS: cleaning, shopping, community activity, occupation, and yard work  PERSONAL FACTORS: Past/current experiences, Time since onset of injury/illness/exacerbation, and 1-2 comorbidities: Relevant PMHx includes autoimmune hepatitis and cirrhosis of liver, HTN, thrombocytopenia  are also affecting patient's functional outcome.   REHAB POTENTIAL: Good  CLINICAL DECISION MAKING: Stable/uncomplicated  EVALUATION COMPLEXITY: Low   GOALS: Goals reviewed with patient? Yes  SHORT TERM GOALS: Target date: 07/30/2023   Patient will be independent  with initial home program for LE strengthening.  Baseline: provided at eval  Goal status: INITIAL  2.  Patient will demonstrate ability to safely ambulate throughout the clinic using SPC in left UE without LOB.  Baseline: using SPC in right UE Goal status: INITIAL   LONG TERM GOALS:  Target date: 08/27/2023   Patient will report improved overall functional ability with FOTO score of 70 or greater.  Baseline: 55 Goal status: INITIAL  2.  Patient will improved distance to 239ft or greater.  Baseline: 235 ft Goal status: INITIAL  3.  Patient will demonstrate ability to navigate uneven surfaces and obstacles while walking using LRAD and ability to safely recover during instances of LOB.  Baseline: limited static standing balance on uneven surfaces with EC Goal status: INITIAL  4.  Patient will report ability to perform car transfers with minimal-to-no pain or difficulty.  Baseline: moderate-to-severe difficulty getting R LE into his vehicle  Goal status: INITIAL  5.  Patient will demonstrate at least 4/5 MMT with R LE strength testing in order to improve overall function.  Baseline: see objective measures Goal status: INITIAL     PLAN:  PT FREQUENCY: 1-2x/week  PT DURATION: 8 weeks  PLANNED INTERVENTIONS: 97164- PT Re-evaluation, 97110-Therapeutic exercises, 97530- Therapeutic activity, 97112- Neuromuscular re-education, 97535- Self Care, 02859- Manual therapy, [id number]- Aquatic Therapy, 97014- Electrical stimulation (unattended), [id number]- Electrical stimulation (manual), Patient/Family education, Balance training, Taping, Dry Needling, Joint mobilization, Cryotherapy, and Moist heat  PLAN FOR NEXT SESSION: continue with LE Strength, navigating inclined/uneven surfaces, strengthening for car transfers, ambulation activities to improve safety and confidence   Helene BRAVO Yarimar Lavis PT  07/16/2023, 12:15 PM

## 2023-07-19 NOTE — Telephone Encounter (Signed)
Do you have the paperwork for this patient?

## 2023-07-19 NOTE — Telephone Encounter (Signed)
 Spoke with patient on 07/15/2022. Patient voiced that he did not have any forms , all the Doctor need to do is the write a letter that says he can not work.

## 2023-07-21 ENCOUNTER — Ambulatory Visit: Payer: Medicaid Other

## 2023-07-21 DIAGNOSIS — M79604 Pain in right leg: Secondary | ICD-10-CM

## 2023-07-21 DIAGNOSIS — M6281 Muscle weakness (generalized): Secondary | ICD-10-CM

## 2023-07-21 DIAGNOSIS — R2689 Other abnormalities of gait and mobility: Secondary | ICD-10-CM

## 2023-07-21 NOTE — Therapy (Signed)
 OUTPATIENT PHYSICAL THERAPY TREATMENT NOTE   Patient Name: Timothy [name] MRN: [id number] DOB:Mar 03, 1961, 63 y.o., male Today's Date: 07/21/2023  END OF SESSION:  PT End of Session - 07/21/23 1000     Visit Number 4    Number of Visits 17    Date for PT Re-Evaluation 08/27/23    Authorization Type [employer] MCD    PT Start Time 1000    PT Stop Time 1039    PT Time Calculation (min) 39 min    Activity Tolerance Patient tolerated treatment well    Behavior During Therapy WFL for tasks assessed/performed              Past Medical History:  Diagnosis Date   Autoimmune hepatitis (HCC)    Cirrhosis of liver (HCC)    Hypertension    Thrombocytopenia (HCC)    Past Surgical History:  Procedure Laterality Date   ESOPHAGEAL BANDING  10/20/2022   Procedure: ESOPHAGEAL BANDING;  Surgeon: Nannette Babe, MD;  Location[facility] ENDOSCOPY;  Service: Gastroenterology;;   ESOPHAGEAL BANDING  11/11/2022   Procedure: ESOPHAGEAL BANDING;  Surgeon: Nannette Babe, MD;  Location: [facility] ENDOSCOPY;  Service: Gastroenterology;;   ESOPHAGEAL BANDING N/A 12/07/2022   Procedure: ESOPHAGEAL BANDING;  Surgeon: Tobin Forts, MD;  Location: [facility] ENDOSCOPY;  Service: Gastroenterology;  Laterality: N/A;   ESOPHAGOGASTRODUODENOSCOPY N/A 10/20/2022   Procedure: ESOPHAGOGASTRODUODENOSCOPY (EGD);  Surgeon: Nannette Babe, MD;  Location: [facility] ENDOSCOPY;  Service: Gastroenterology;  Laterality: N/A;  per ICU   ESOPHAGOGASTRODUODENOSCOPY (EGD) WITH PROPOFOL  N/A 11/11/2022   Procedure: ESOPHAGOGASTRODUODENOSCOPY (EGD) WITH PROPOFOL ;  Surgeon: Nannette Babe, MD;  Location: [facility] ENDOSCOPY;  Service: Gastroenterology;  Laterality: N/A;   ESOPHAGOGASTRODUODENOSCOPY (EGD) WITH PROPOFOL  N/A 12/07/2022   Procedure: ESOPHAGOGASTRODUODENOSCOPY (EGD) WITH PROPOFOL ;  Surgeon: Tobin Forts, MD;  Location: [facility] ENDOSCOPY;  Service: Gastroenterology;  Laterality: N/A;   ESOPHAGOGASTRODUODENOSCOPY (EGD) WITH PROPOFOL  N/A 03/01/2023   Procedure:  ESOPHAGOGASTRODUODENOSCOPY (EGD) WITH PROPOFOL ;  Surgeon: Nannette Babe, MD;  Location: [facility] ENDOSCOPY;  Service: Gastroenterology;  Laterality: N/A;   Patient Active Problem List   Diagnosis Date Noted   History of hepatitis C 01/05/2023   Esophageal varices with bleeding in diseases classified elsewhere (HCC) 10/20/2022   Portal hypertension with esophageal varices (HCC) 10/20/2022   Multiple benign nevi 01/27/2022   Thrombocytopenia (HCC) 10/24/2015   Liver cirrhosis (HCC) 10/24/2015   Autoimmune hepatitis (HCC) 03/28/2013   Essential hypertension 11/15/2012    PCP: Vernell Goldsmith, MD  REFERRING PROVIDER: Joaquin Mulberry, MD  REFERRING DIAG: Right leg weakness [R29.898]   THERAPY DIAG:  Pain in right leg  Muscle weakness (generalized)  Other abnormalities of gait and mobility  Rationale for Evaluation and Treatment: Rehabilitation  ONSET DATE: 12/05/2022  SUBJECTIVE:   SUBJECTIVE STATEMENT: Patient reports continued overall improvement and that he does not need to use his cane. He states he would like to continue with scheduled visits but may be interested in DC at last scheduled visit.   EVAL: Patient states that he was diagnosed with cirrhosis of the liver in April, after which he had a fall. Then, he started noticing problems with his left leg in June. He states that he is having muscle cramps in the middle of the night, including on the top of his foot. He is also feeling like his right leg is "just hanging on" or "limber" after about 1 hours of standing/walking. He has states that he doesn't have a problem driving, but it is difficult to  get his leg into the vehicle. He does also recall twisting his right ankle around the same time that he noticed to lower extremity weakness.   PERTINENT HISTORY: Relevant PMHx includes autoimmune hepatitis and cirrhosis of liver, HTN, thrombocytopenia  PAIN:  Are you having pain? Yes: NPRS scale: 0-7/10 Pain location: R LE Pain  description: "hanging on", "limber"   Aggravating factors: prolonged walking, getting in/out of the car Relieving factors: rest  PRECAUTIONS: Fall  RED FLAGS: None   WEIGHT BEARING RESTRICTIONS: No  FALLS:  Has patient fallen in last 6 months? Yes. Number of falls a few while trying to run in the backyard   LIVING ENVIRONMENT: Lives with:  "I've got a room but I live with someone"  Lives in: House/apartment Stairs: No Has following equipment at home: Single point cane, Walker - 4 wheeled, and Grab bars  OCCUPATION: n/a  PLOF: Independent  PATIENT GOALS: To be able to walk longer without pain/difficulty.   NEXT MD VISIT: 09/13/22  OBJECTIVE:  Note: Objective measures were completed at Evaluation unless otherwise noted.  DIAGNOSTIC FINDINGS:   no relevant imaging results available in epic; none included in referral   PATIENT SURVEYS:  FOTO 55 current, 70 predicted  COGNITION: Overall cognitive status: Within functional limits for tasks assessed     SENSATION: Not tested   LOWER EXTREMITY MMT:  MMT Right eval Left eval  Hip flexion 3 4+  Hip extension 2 3+  Hip abduction 3 4-  Hip adduction 4- 4-  Hip internal rotation 4 4  Hip external rotation 4 4  Knee flexion 4- 4  Knee extension 4 4+  Ankle dorsiflexion 4 4+  Ankle plantarflexion    Ankle inversion    Ankle eversion     (Blank rows = not tested)   FUNCTIONAL TESTS:  2 minute walk test: 234ft MCTSIB: Condition 1: Avg of 3 trials: 30 sec, Condition 2: Avg of 3 trials: 30 sec, Condition 3: Avg of 3 trials: 30 sec, Condition 4: Avg of 3 trials: 27 sec, and Total Score: 117/120  GAIT: Distance walked: see Assistive device utilized: Single point cane on Rt Level of assistance: Modified independence Comments: increased R hip ER, increased R toe out, diminished L step length and gait speed  07/21/23: No longer using AD                                                                                                                                 TREATMENT DATE:  [facility] Adult PT Treatment:                                                DATE: 07/21/23 Therapeutic Exercise: Bike level 3 x 5' Standing heel/toe raises 2x10 Standing hip abduction YTB at ankles 2x10 BIL Standing  hip extension 2x10 BIL (2nd set YTB) 4" step ups RLE leading 2x10 fwd/lat Omega knee extension 10# 2x10 Omega knee flexion 20# 2x10 Seated hip adduction with pilates ring 5" hold 2x10 Sit to stand from raised table - 2x10 - no UE   [facility] Adult PT Treatment:                                                DATE: 07/16/23 Therapeutic Exercise: Bike 5' Standing heel/toe raises 2x10 Standing hip abduction 2x10 BIL (cues for form and pacing) Standing marching 2x15 (cues for form and pacing) LAQ 3" hold at top 3x10 BIL - 5# supine hip adduction with pilates ring 5" hold 2x10 Seated clam - Black TB - 3x10 Seated knee flexion Black TB 2x10 BIL SLR 2x10 BIL Bridges 2x10 Sit to stand from raised table - 2x10  Consider: rocker board and step up  [facility] Adult PT Treatment:                                                DATE: 07/14/23 Therapeutic Exercise: Nustep level 3 x 8 mins Standing heel/toe raises 2x10 Standing hip abduction x10 BIL (cues for form and pacing) Standing marching 2x30" (cues for form and pacing) Seated marching 2x30" LAQ 3" hold at top 2x10 BIL Seated hip adduction 5" hold 2x10 Seated knee flexion GTB 2x10 BIL SLR 2x10 BIL Bridges 2x10   PATIENT EDUCATION:  Education details: reviewed initial home exercise program; discussion of POC, prognosis and goals for skilled PT; correct usage of SPC (L UE)  Person educated: Patient Education method: Explanation, Demonstration, and Handouts Education comprehension: verbalized understanding, returned demonstration, and needs further education  HOME EXERCISE PROGRAM: Access Code: [email or web address] URL: [email or web address] Date:  07/02/2023 Prepared by: Arlester Bence  Exercises - Seated Hip Abduction with Resistance  - 2 x daily - 7 x weekly - 2 sets - 10 reps - 3 sec hold - Seated Hip Adduction Isometrics with Ball  - 2 x daily - 7 x weekly - 2 sets - 10 reps - Seated Knee Extension with Resistance  - 2 x daily - 7 x weekly - 2 sets - 10 reps - Seated March with Resistance  - 2 x daily - 7 x weekly - 2 sets - 10 reps  ASSESSMENT:  CLINICAL IMPRESSION: Patient presents to PT reporting continued improvement and that he no longer is using the cane for ambulation. Session today continued to focus on LE strengthening with addition of step ups and increased resistance on knee extension/flexion to good effect, no reports of increased pain. Patient was able to tolerate all prescribed exercises with no adverse effects. Patient continues to benefit from skilled PT services and should be progressed as able to improve functional independence.    EVAL: Gaige is a 63 y.o. male who was seen today for physical therapy evaluation and treatment for Right Lower Extremity Weakness. He is demonstrating decreased LE MMT scores, decreased static standing balance, and altered gait mechanics. He has related pain and difficulty with prolonged walking, running car transfers and navigating uneven surfaces. He requires skilled PT services at this time to address relevant deficits and improve overall function.     OBJECTIVE IMPAIRMENTS: Abnormal  gait, decreased activity tolerance, decreased balance, decreased mobility, decreased strength, increased muscle spasms, improper body mechanics, and pain.   ACTIVITY LIMITATIONS: squatting, stairs, transfers, dressing, locomotion level, and car transfers  PARTICIPATION LIMITATIONS: cleaning, shopping, community activity, occupation, and yard work  PERSONAL FACTORS: Past/current experiences, Time since onset of injury/illness/exacerbation, and 1-2 comorbidities: Relevant PMHx includes autoimmune hepatitis  and cirrhosis of liver, HTN, thrombocytopenia  are also affecting patient's functional outcome.   REHAB POTENTIAL: Good  CLINICAL DECISION MAKING: Stable/uncomplicated  EVALUATION COMPLEXITY: Low   GOALS: Goals reviewed with patient? Yes  SHORT TERM GOALS: Target date: 07/30/2023   Patient will be independent with initial home program for LE strengthening.  Baseline: provided at eval  Goal status: MET  2.  Patient will demonstrate ability to safely ambulate throughout the clinic using SPC in left UE without LOB.  Baseline: using SPC in right UE Goal status: MET 07/21/23: Patient able to ambulate without AD with no LOB   LONG TERM GOALS: Target date: 08/27/2023   Patient will report improved overall functional ability with FOTO score of 70 or greater.  Baseline: 55 Goal status: INITIAL  2.  Patient will improved distance to 236ft or greater.  Baseline: 235 ft Goal status: INITIAL  3.  Patient will demonstrate ability to navigate uneven surfaces and obstacles while walking using LRAD and ability to safely recover during instances of LOB.  Baseline: limited static standing balance on uneven surfaces with EC Goal status: INITIAL  4.  Patient will report ability to perform car transfers with minimal-to-no pain or difficulty.  Baseline: moderate-to-severe difficulty getting R LE into his vehicle  Goal status: INITIAL  5.  Patient will demonstrate at least 4/5 MMT with R LE strength testing in order to improve overall function.  Baseline: see objective measures Goal status: INITIAL     PLAN:  PT FREQUENCY: 1-2x/week  PT DURATION: 8 weeks  PLANNED INTERVENTIONS: 97164- PT Re-evaluation, 97110-Therapeutic exercises, 97530- Therapeutic activity, 97112- Neuromuscular re-education, 97535- Self Care, 16109- Manual therapy, [id number]- Aquatic Therapy, 97014- Electrical stimulation (unattended), [phone]- Electrical stimulation (manual), Patient/Family education, Balance training,  Taping, Dry Needling, Joint mobilization, Cryotherapy, and Moist heat  PLAN FOR NEXT SESSION: continue with LE Strength, navigating inclined/uneven surfaces, strengthening for car transfers, ambulation activities to improve safety and confidence   Anna Kettering PTA  07/21/2023, 10:40 AM

## 2023-07-23 ENCOUNTER — Encounter: Payer: Self-pay | Admitting: Internal Medicine

## 2023-07-23 ENCOUNTER — Encounter: Payer: Self-pay | Admitting: Physical Therapy

## 2023-07-23 ENCOUNTER — Ambulatory Visit: Payer: Medicaid Other | Admitting: Physical Therapy

## 2023-07-23 DIAGNOSIS — M79604 Pain in right leg: Secondary | ICD-10-CM | POA: Diagnosis not present

## 2023-07-23 DIAGNOSIS — M6281 Muscle weakness (generalized): Secondary | ICD-10-CM

## 2023-07-23 DIAGNOSIS — R2689 Other abnormalities of gait and mobility: Secondary | ICD-10-CM

## 2023-07-23 NOTE — Therapy (Unsigned)
OUTPATIENT PHYSICAL THERAPY TREATMENT NOTE   Patient Name: Timothy [name] MRN: [id number] DOB:[birth year], 63 y.o., male Today's Date: 07/24/2023  END OF SESSION:  PT End of Session - 07/23/23 1120     Visit Number 5    Number of Visits 17    Date for PT Re-Evaluation 08/27/23    Authorization Type [employer] MCD    PT Start Time 1130    PT Stop Time 1211    PT Time Calculation (min) 41 min    Activity Tolerance Patient tolerated treatment well    Behavior During Therapy WFL for tasks assessed/performed              Past Medical History:  Diagnosis Date   Autoimmune hepatitis (HCC)    Cirrhosis of liver (HCC)    Hypertension    Thrombocytopenia (HCC)    Past Surgical History:  Procedure Laterality Date   ESOPHAGEAL BANDING  10/20/2022   Procedure: ESOPHAGEAL BANDING;  Surgeon: Beverley Fiedler, MD;  Location: [facility] ENDOSCOPY;  Service: Gastroenterology;;   ESOPHAGEAL BANDING  11/11/2022   Procedure: ESOPHAGEAL BANDING;  Surgeon: Beverley Fiedler, MD;  Location: [facility] ENDOSCOPY;  Service: Gastroenterology;;   ESOPHAGEAL BANDING N/A 12/07/2022   Procedure: ESOPHAGEAL BANDING;  Surgeon: Hilarie Fredrickson, MD;  Location: [facility] ENDOSCOPY;  Service: Gastroenterology;  Laterality: N/A;   ESOPHAGOGASTRODUODENOSCOPY N/A 10/20/2022   Procedure: ESOPHAGOGASTRODUODENOSCOPY (EGD);  Surgeon: Beverley Fiedler, MD;  Location: [facility] ENDOSCOPY;  Service: Gastroenterology;  Laterality: N/A;  per ICU   ESOPHAGOGASTRODUODENOSCOPY (EGD) WITH PROPOFOL N/A 11/11/2022   Procedure: ESOPHAGOGASTRODUODENOSCOPY (EGD) WITH PROPOFOL;  Surgeon: Beverley Fiedler, MD;  Location: [facility] ENDOSCOPY;  Service: Gastroenterology;  Laterality: N/A;   ESOPHAGOGASTRODUODENOSCOPY (EGD) WITH PROPOFOL N/A 12/07/2022   Procedure: ESOPHAGOGASTRODUODENOSCOPY (EGD) WITH PROPOFOL;  Surgeon: Hilarie Fredrickson, MD;  Location: [facility] ENDOSCOPY;  Service: Gastroenterology;  Laterality: N/A;   ESOPHAGOGASTRODUODENOSCOPY (EGD) WITH PROPOFOL N/A 03/01/2023   Procedure:  ESOPHAGOGASTRODUODENOSCOPY (EGD) WITH PROPOFOL;  Surgeon: Beverley Fiedler, MD;  Location: [facility] ENDOSCOPY;  Service: Gastroenterology;  Laterality: N/A;   Patient Active Problem List   Diagnosis Date Noted   History of hepatitis C 01/05/2023   Esophageal varices with bleeding in diseases classified elsewhere (HCC) 10/20/2022   Portal hypertension with esophageal varices (HCC) 10/20/2022   Multiple benign nevi 01/27/2022   Thrombocytopenia (HCC) 10/24/2015   Liver cirrhosis (HCC) 10/24/2015   Autoimmune hepatitis (HCC) 03/28/2013   Essential hypertension 11/15/2012    PCP: Storm Frisk, MD  REFERRING PROVIDER: Hoy Register, MD  REFERRING DIAG: Right leg weakness [R29.898]   THERAPY DIAG:  Pain in right leg  Muscle weakness (generalized)  Other abnormalities of gait and mobility  Rationale for Evaluation and Treatment: Rehabilitation  ONSET DATE: 12/05/2022  SUBJECTIVE:   SUBJECTIVE STATEMENT: Pt reports that he continues to see improvement.  He feels he will be ready for D/C at last scheduled visit.  EVAL: Patient states that he was diagnosed with cirrhosis of the liver in April, after which he had a fall. Then, he started noticing problems with his left leg in June. He states that he is having muscle cramps in the middle of the night, including on the top of his foot. He is also feeling like his right leg is "just hanging on" or "limber" after about 1 hours of standing/walking. He has states that he doesn't have a problem driving, but it is difficult to get his leg into the vehicle. He does also recall twisting his right ankle around  the same time that he noticed to lower extremity weakness.   PERTINENT HISTORY: Relevant PMHx includes autoimmune hepatitis and cirrhosis of liver, HTN, thrombocytopenia  PAIN:  Are you having pain? Yes: NPRS scale: 0-7/10 Pain location: R LE Pain description: "hanging on", "limber"   Aggravating factors: prolonged walking, getting in/out  of the car Relieving factors: rest  PRECAUTIONS: Fall  RED FLAGS: None   WEIGHT BEARING RESTRICTIONS: No  FALLS:  Has patient fallen in last 6 months? Yes. Number of falls a few while trying to run in the backyard   LIVING ENVIRONMENT: Lives with:  "I've got a room but I live with someone"  Lives in: House/apartment Stairs: No Has following equipment at home: Single point cane, Walker - 4 wheeled, and Grab bars  OCCUPATION: n/a  PLOF: Independent  PATIENT GOALS: To be able to walk longer without pain/difficulty.   NEXT MD VISIT: 09/13/22  OBJECTIVE:  Note: Objective measures were completed at Evaluation unless otherwise noted.  DIAGNOSTIC FINDINGS:   no relevant imaging results available in epic; none included in referral   PATIENT SURVEYS:  FOTO 55 current, 70 predicted  COGNITION: Overall cognitive status: Within functional limits for tasks assessed     SENSATION: Not tested   LOWER EXTREMITY MMT:  MMT Right eval Left eval  Hip flexion 3 4+  Hip extension 2 3+  Hip abduction 3 4-  Hip adduction 4- 4-  Hip internal rotation 4 4  Hip external rotation 4 4  Knee flexion 4- 4  Knee extension 4 4+  Ankle dorsiflexion 4 4+  Ankle plantarflexion    Ankle inversion    Ankle eversion     (Blank rows = not tested)   FUNCTIONAL TESTS:  2 minute walk test: 274ft MCTSIB: Condition 1: Avg of 3 trials: 30 sec, Condition 2: Avg of 3 trials: 30 sec, Condition 3: Avg of 3 trials: 30 sec, Condition 4: Avg of 3 trials: 27 sec, and Total Score: 117/120  GAIT: Distance walked: see Assistive device utilized: Single point cane on Rt Level of assistance: Modified independence Comments: increased R hip ER, increased R toe out, diminished L step length and gait speed  07/21/23: No longer using AD                                                                                                                                TREATMENT DATE:  [facility] Adult PT  Treatment:                                                DATE: 07/23/23 Therapeutic Exercise: Bike level 3 x 5' Standing heel/toe raises 2x20 SL 10x ea Standing hip abduction YTB at ankles 2x10 BIL Standing hip extension 2x10 BIL (2nd set YTB) 6" step ups RLE leading  x10 fwd/lat Omega knee extension 10# 2x10 - S/L Omega knee flexion 25# 2x10 Cybex leg press - knees at ~ 90 - 2x10 - 60#  Neuromuscular re-ed: Rocker board Hurdle walking fwd and lat   [facility] Adult PT Treatment:                                                DATE: 07/16/23 Therapeutic Exercise: Bike 5' Standing heel/toe raises 2x10 Standing hip abduction 2x10 BIL (cues for form and pacing) Standing marching 2x15 (cues for form and pacing) LAQ 3" hold at top 3x10 BIL - 5# supine hip adduction with pilates ring 5" hold 2x10 Seated clam - Black TB - 3x10 Seated knee flexion Black TB 2x10 BIL SLR 2x10 BIL Bridges 2x10 Sit to stand from raised table - 2x10  Consider: rocker board and step up  [facility] Adult PT Treatment:                                                DATE: 07/14/23 Therapeutic Exercise: Nustep level 3 x 8 mins Standing heel/toe raises 2x10 Standing hip abduction x10 BIL (cues for form and pacing) Walking march at counter Seated hip adduction 5" hold 2x10 Seated knee flexion GTB 2x10 BIL SLR 2x10 BIL Bridges 2x10   PATIENT EDUCATION:  Education details: reviewed initial home exercise program; discussion of POC, prognosis and goals for skilled PT; correct usage of SPC (L UE)  Person educated: Patient Education method: Explanation, Demonstration, and Handouts Education comprehension: verbalized understanding, returned demonstration, and needs further education  HOME EXERCISE PROGRAM: Access Code: [email or web address] URL: [email or web address] Date: 07/02/2023 Prepared by: Mauri Reading  Exercises - Seated Hip Abduction with Resistance  - 2 x daily - 7 x weekly - 2 sets - 10 reps - 3 sec  hold - Seated Hip Adduction Isometrics with Ball  - 2 x daily - 7 x weekly - 2 sets - 10 reps - Seated Knee Extension with Resistance  - 2 x daily - 7 x weekly - 2 sets - 10 reps - Seated March with Resistance  - 2 x daily - 7 x weekly - 2 sets - 10 reps  ASSESSMENT:  CLINICAL IMPRESSION: Mayan tolerated session well with no adverse reaction.  Discussed the importance of continuing to build strength and work on balance outside of PT to normalize gait and reduce fall risk; pt receptive to possible gym membership.  Pt progressing strength and balance as expected in clinic with improved tolerance with higher load isolated strengthening.  Pt requires mod cues for pacing with hurdle walking.   EVAL: Sheriff is a 63 y.o. male who was seen today for physical therapy evaluation and treatment for Right Lower Extremity Weakness. He is demonstrating decreased LE MMT scores, decreased static standing balance, and altered gait mechanics. He has related pain and difficulty with prolonged walking, running car transfers and navigating uneven surfaces. He requires skilled PT services at this time to address relevant deficits and improve overall function.     OBJECTIVE IMPAIRMENTS: Abnormal gait, decreased activity tolerance, decreased balance, decreased mobility, decreased strength, increased muscle spasms, improper body mechanics, and pain.   ACTIVITY LIMITATIONS: squatting, stairs, transfers, dressing, locomotion level, and car transfers  PARTICIPATION  LIMITATIONS: cleaning, shopping, community activity, occupation, and yard work  PERSONAL FACTORS: Past/current experiences, Time since onset of injury/illness/exacerbation, and 1-2 comorbidities: Relevant PMHx includes autoimmune hepatitis and cirrhosis of liver, HTN, thrombocytopenia  are also affecting patient's functional outcome.   REHAB POTENTIAL: Good  CLINICAL DECISION MAKING: Stable/uncomplicated  EVALUATION COMPLEXITY: Low   GOALS: Goals reviewed  with patient? Yes  SHORT TERM GOALS: Target date: 07/30/2023   Patient will be independent with initial home program for LE strengthening.  Baseline: provided at eval  Goal status: MET  2.  Patient will demonstrate ability to safely ambulate throughout the clinic using SPC in left UE without LOB.  Baseline: using SPC in right UE Goal status: MET 07/21/23: Patient able to ambulate without AD with no LOB   LONG TERM GOALS: Target date: 08/27/2023   Patient will report improved overall functional ability with FOTO score of 70 or greater.  Baseline: 55 Goal status: INITIAL  2.  Patient will improved distance to 266ft or greater.  Baseline: 235 ft Goal status: INITIAL  3.  Patient will demonstrate ability to navigate uneven surfaces and obstacles while walking using LRAD and ability to safely recover during instances of LOB.  Baseline: limited static standing balance on uneven surfaces with EC Goal status: INITIAL  4.  Patient will report ability to perform car transfers with minimal-to-no pain or difficulty.  Baseline: moderate-to-severe difficulty getting R LE into his vehicle  Goal status: INITIAL  5.  Patient will demonstrate at least 4/5 MMT with R LE strength testing in order to improve overall function.  Baseline: see objective measures Goal status: INITIAL     PLAN:  PT FREQUENCY: 1-2x/week  PT DURATION: 8 weeks  PLANNED INTERVENTIONS: 97164- PT Re-evaluation, 97110-Therapeutic exercises, 97530- Therapeutic activity, 97112- Neuromuscular re-education, 97535- Self Care, 96295- Manual therapy, [id number]- Aquatic Therapy, 97014- Electrical stimulation (unattended), [phone]- Electrical stimulation (manual), Patient/Family education, Balance training, Taping, Dry Needling, Joint mobilization, Cryotherapy, and Moist heat  PLAN FOR NEXT SESSION: continue with LE Strength, navigating inclined/uneven surfaces, strengthening for car transfers, ambulation activities to improve  safety and confidence   Fredderick Phenix PT  07/24/2023, 8:46 AM

## 2023-07-23 NOTE — Telephone Encounter (Signed)
Letter written and routed to [facility]

## 2023-07-23 NOTE — Telephone Encounter (Signed)
Dr Rhea Belton,  Please advise.Marland KitchenMarland Kitchen

## 2023-07-23 NOTE — Telephone Encounter (Signed)
Patient called stated his lawyer has not received a letter, is wanting a call back to follow up.

## 2023-07-23 NOTE — Telephone Encounter (Signed)
Letter has been mailed to attorneys office and letter has been faxed attention Marchelle Folks with information provided by patient:  Timothy [name] P.O box [phone] Fax # [phone]

## 2023-07-28 ENCOUNTER — Encounter: Payer: Self-pay | Admitting: Physical Therapy

## 2023-07-28 ENCOUNTER — Ambulatory Visit: Payer: Medicaid Other | Admitting: Physical Therapy

## 2023-07-28 DIAGNOSIS — M6281 Muscle weakness (generalized): Secondary | ICD-10-CM

## 2023-07-28 DIAGNOSIS — R2689 Other abnormalities of gait and mobility: Secondary | ICD-10-CM

## 2023-07-28 DIAGNOSIS — M79604 Pain in right leg: Secondary | ICD-10-CM | POA: Diagnosis not present

## 2023-07-28 NOTE — Therapy (Signed)
OUTPATIENT PHYSICAL THERAPY TREATMENT NOTE   Patient Name: Timothy [name] MRN: [id number] DOB:Aug 12, 1960, 63 y.o., male Today's Date: 07/28/2023  END OF SESSION:  PT End of Session - 07/28/23 1006     Visit Number 6    Number of Visits 17    Date for PT Re-Evaluation 08/27/23    Authorization Type [employer] MCD    PT Start Time 1001    PT Stop Time 1042    PT Time Calculation (min) 41 min    Activity Tolerance Patient tolerated treatment well    Behavior During Therapy WFL for tasks assessed/performed              Past Medical History:  Diagnosis Date   Autoimmune hepatitis (HCC)    Cirrhosis of liver (HCC)    Hypertension    Thrombocytopenia (HCC)    Past Surgical History:  Procedure Laterality Date   ESOPHAGEAL BANDING  10/20/2022   Procedure: ESOPHAGEAL BANDING;  Surgeon: Beverley Fiedler, MD;  Location: [facility] ENDOSCOPY;  Service: Gastroenterology;;   ESOPHAGEAL BANDING  11/11/2022   Procedure: ESOPHAGEAL BANDING;  Surgeon: Beverley Fiedler, MD;  Location: [facility] ENDOSCOPY;  Service: Gastroenterology;;   ESOPHAGEAL BANDING N/A 12/07/2022   Procedure: ESOPHAGEAL BANDING;  Surgeon: Hilarie Fredrickson, MD;  Location: [facility] ENDOSCOPY;  Service: Gastroenterology;  Laterality: N/A;   ESOPHAGOGASTRODUODENOSCOPY N/A 10/20/2022   Procedure: ESOPHAGOGASTRODUODENOSCOPY (EGD);  Surgeon: Beverley Fiedler, MD;  Location: [facility] ENDOSCOPY;  Service: Gastroenterology;  Laterality: N/A;  per ICU   ESOPHAGOGASTRODUODENOSCOPY (EGD) WITH PROPOFOL N/A 11/11/2022   Procedure: ESOPHAGOGASTRODUODENOSCOPY (EGD) WITH PROPOFOL;  Surgeon: Beverley Fiedler, MD;  Location: [facility] ENDOSCOPY;  Service: Gastroenterology;  Laterality: N/A;   ESOPHAGOGASTRODUODENOSCOPY (EGD) WITH PROPOFOL N/A 12/07/2022   Procedure: ESOPHAGOGASTRODUODENOSCOPY (EGD) WITH PROPOFOL;  Surgeon: Hilarie Fredrickson, MD;  Location: [facility] ENDOSCOPY;  Service: Gastroenterology;  Laterality: N/A;   ESOPHAGOGASTRODUODENOSCOPY (EGD) WITH PROPOFOL N/A 03/01/2023   Procedure:  ESOPHAGOGASTRODUODENOSCOPY (EGD) WITH PROPOFOL;  Surgeon: Beverley Fiedler, MD;  Location: [facility] ENDOSCOPY;  Service: Gastroenterology;  Laterality: N/A;   Patient Active Problem List   Diagnosis Date Noted   History of hepatitis C 01/05/2023   Esophageal varices with bleeding in diseases classified elsewhere (HCC) 10/20/2022   Portal hypertension with esophageal varices (HCC) 10/20/2022   Multiple benign nevi 01/27/2022   Thrombocytopenia (HCC) 10/24/2015   Liver cirrhosis (HCC) 10/24/2015   Autoimmune hepatitis (HCC) 03/28/2013   Essential hypertension 11/15/2012    PCP: Storm Frisk, MD  REFERRING PROVIDER: Hoy Register, MD  REFERRING DIAG: Right leg weakness [R29.898]   THERAPY DIAG:  Pain in right leg  Muscle weakness (generalized)  Other abnormalities of gait and mobility  Rationale for Evaluation and Treatment: Rehabilitation  ONSET DATE: 12/05/2022  SUBJECTIVE:   SUBJECTIVE STATEMENT: Pt reports he has been working on his exercises at home and seeing improvement.  EVAL: Patient states that he was diagnosed with cirrhosis of the liver in April, after which he had a fall. Then, he started noticing problems with his left leg in June. He states that he is having muscle cramps in the middle of the night, including on the top of his foot. He is also feeling like his right leg is "just hanging on" or "limber" after about 1 hours of standing/walking. He has states that he doesn't have a problem driving, but it is difficult to get his leg into the vehicle. He does also recall twisting his right ankle around the same time that he noticed to  lower extremity weakness.   PERTINENT HISTORY: Relevant PMHx includes autoimmune hepatitis and cirrhosis of liver, HTN, thrombocytopenia  PAIN:  Are you having pain? Yes: NPRS scale: 0-7/10 Pain location: R LE Pain description: "hanging on", "limber"   Aggravating factors: prolonged walking, getting in/out of the car Relieving  factors: rest  PRECAUTIONS: Fall  RED FLAGS: None   WEIGHT BEARING RESTRICTIONS: No  FALLS:  Has patient fallen in last 6 months? Yes. Number of falls a few while trying to run in the backyard   LIVING ENVIRONMENT: Lives with:  "I've got a room but I live with someone"  Lives in: House/apartment Stairs: No Has following equipment at home: Single point cane, Walker - 4 wheeled, and Grab bars  OCCUPATION: n/a  PLOF: Independent  PATIENT GOALS: To be able to walk longer without pain/difficulty.   NEXT MD VISIT: 09/13/22  OBJECTIVE:  Note: Objective measures were completed at Evaluation unless otherwise noted.  DIAGNOSTIC FINDINGS:   no relevant imaging results available in epic; none included in referral   PATIENT SURVEYS:  FOTO 55 current, 70 predicted  COGNITION: Overall cognitive status: Within functional limits for tasks assessed     SENSATION: Not tested   LOWER EXTREMITY MMT:  MMT Right eval Left eval  Hip flexion 3 4+  Hip extension 2 3+  Hip abduction 3 4-  Hip adduction 4- 4-  Hip internal rotation 4 4  Hip external rotation 4 4  Knee flexion 4- 4  Knee extension 4 4+  Ankle dorsiflexion 4 4+  Ankle plantarflexion    Ankle inversion    Ankle eversion     (Blank rows = not tested)   FUNCTIONAL TESTS:  2 minute walk test: 268ft MCTSIB: Condition 1: Avg of 3 trials: 30 sec, Condition 2: Avg of 3 trials: 30 sec, Condition 3: Avg of 3 trials: 30 sec, Condition 4: Avg of 3 trials: 27 sec, and Total Score: 117/120  GAIT: Distance walked: see Assistive device utilized: Single point cane on Rt Level of assistance: Modified independence Comments: increased R hip ER, increased R toe out, diminished L step length and gait speed  07/21/23: No longer using AD                                                                                                                                TREATMENT DATE:  [facility] Adult PT Treatment:                                                 DATE: 07/28/23 Therapeutic Exercise: Bike level 3 x 5' Standing heel/toe raises 2x20 SL 10x2 ea Leaning against wall 2x10 bil Slant board stretch - 1'  Omega knee extension 10# 2x10 - S/L Omega knee flexion 25# 2x10 S/L  Cybex leg press -  knees at ~ 90 - 2x10 - 40# Sl @ 30# 2x10  [facility] Adult PT Treatment:                                                DATE: 07/16/23 Therapeutic Exercise: Bike 5' Standing heel/toe raises 2x10 Standing hip abduction 2x10 BIL (cues for form and pacing) Standing marching 2x15 (cues for form and pacing) LAQ 3" hold at top 3x10 BIL - 5# supine hip adduction with pilates ring 5" hold 2x10 Seated clam - Black TB - 3x10 Seated knee flexion Black TB 2x10 BIL SLR 2x10 BIL Bridges 2x10 Sit to stand from raised table - 2x10  Consider: rocker board and step up  [facility] Adult PT Treatment:                                                DATE: 07/14/23 Therapeutic Exercise: Nustep level 3 x 8 mins Standing heel/toe raises 2x10 Standing hip abduction x10 BIL (cues for form and pacing) Walking march at counter Seated hip adduction 5" hold 2x10 Seated knee flexion GTB 2x10 BIL SLR 2x10 BIL Bridges 2x10   PATIENT EDUCATION:  Education details: reviewed initial home exercise program; discussion of POC, prognosis and goals for skilled PT; correct usage of SPC (L UE)  Person educated: Patient Education method: Explanation, Demonstration, and Handouts Education comprehension: verbalized understanding, returned demonstration, and needs further education  HOME EXERCISE PROGRAM: Access Code: [email or web address] URL: [email or web address] Date: 07/02/2023 Prepared by: Mauri Reading  Exercises - Seated Hip Abduction with Resistance  - 2 x daily - 7 x weekly - 2 sets - 10 reps - 3 sec hold - Seated Hip Adduction Isometrics with Ball  - 2 x daily - 7 x weekly - 2 sets - 10 reps - Seated Knee Extension with Resistance  - 2 x daily - 7 x  weekly - 2 sets - 10 reps - Seated March with Resistance  - 2 x daily - 7 x weekly - 2 sets - 10 reps  ASSESSMENT:  CLINICAL IMPRESSION: Ayinde tolerated session well with no adverse reaction.  Continued concentration on LE isolated strength training that can be completed at gym.  Recommended S/L vs bil exercises to better isolate R LE.  Discussed goal of strength training and appropriate rep ranges.  Pt plans on looking into gym membership.     EVAL: Paull is a 63 y.o. male who was seen today for physical therapy evaluation and treatment for Right Lower Extremity Weakness. He is demonstrating decreased LE MMT scores, decreased static standing balance, and altered gait mechanics. He has related pain and difficulty with prolonged walking, running car transfers and navigating uneven surfaces. He requires skilled PT services at this time to address relevant deficits and improve overall function.     OBJECTIVE IMPAIRMENTS: Abnormal gait, decreased activity tolerance, decreased balance, decreased mobility, decreased strength, increased muscle spasms, improper body mechanics, and pain.   ACTIVITY LIMITATIONS: squatting, stairs, transfers, dressing, locomotion level, and car transfers  PARTICIPATION LIMITATIONS: cleaning, shopping, community activity, occupation, and yard work  PERSONAL FACTORS: Past/current experiences, Time since onset of injury/illness/exacerbation, and 1-2 comorbidities: Relevant PMHx includes autoimmune hepatitis and cirrhosis of liver, HTN, thrombocytopenia  are also affecting  patient's functional outcome.   REHAB POTENTIAL: Good  CLINICAL DECISION MAKING: Stable/uncomplicated  EVALUATION COMPLEXITY: Low   GOALS: Goals reviewed with patient? Yes  SHORT TERM GOALS: Target date: 07/30/2023   Patient will be independent with initial home program for LE strengthening.  Baseline: provided at eval  Goal status: MET  2.  Patient will demonstrate ability to safely ambulate  throughout the clinic using SPC in left UE without LOB.  Baseline: using SPC in right UE Goal status: MET 07/21/23: Patient able to ambulate without AD with no LOB   LONG TERM GOALS: Target date: 08/27/2023   Patient will report improved overall functional ability with FOTO score of 70 or greater.  Baseline: 55 Goal status: INITIAL  2.  Patient will improved distance to 248ft or greater.  Baseline: 235 ft Goal status: INITIAL  3.  Patient will demonstrate ability to navigate uneven surfaces and obstacles while walking using LRAD and ability to safely recover during instances of LOB.  Baseline: limited static standing balance on uneven surfaces with EC Goal status: INITIAL  4.  Patient will report ability to perform car transfers with minimal-to-no pain or difficulty.  Baseline: moderate-to-severe difficulty getting R LE into his vehicle  Goal status: INITIAL  5.  Patient will demonstrate at least 4/5 MMT with R LE strength testing in order to improve overall function.  Baseline: see objective measures Goal status: INITIAL     PLAN:  PT FREQUENCY: 1-2x/week  PT DURATION: 8 weeks  PLANNED INTERVENTIONS: 97164- PT Re-evaluation, 97110-Therapeutic exercises, 97530- Therapeutic activity, 97112- Neuromuscular re-education, 97535- Self Care, 13086- Manual therapy, [id number]- Aquatic Therapy, 97014- Electrical stimulation (unattended), [phone]- Electrical stimulation (manual), Patient/Family education, Balance training, Taping, Dry Needling, Joint mobilization, Cryotherapy, and Moist heat  PLAN FOR NEXT SESSION: continue with LE Strength, navigating inclined/uneven surfaces, strengthening for car transfers, ambulation activities to improve safety and confidence   Fredderick Phenix PT  07/28/2023, 10:53 AM

## 2023-07-30 ENCOUNTER — Ambulatory Visit: Payer: Medicaid Other

## 2023-07-30 DIAGNOSIS — M79604 Pain in right leg: Secondary | ICD-10-CM | POA: Diagnosis not present

## 2023-07-30 DIAGNOSIS — M6281 Muscle weakness (generalized): Secondary | ICD-10-CM

## 2023-07-30 DIAGNOSIS — R2689 Other abnormalities of gait and mobility: Secondary | ICD-10-CM

## 2023-07-30 NOTE — Therapy (Signed)
OUTPATIENT PHYSICAL THERAPY DISCHARGE   Patient Name: Sagan Maselli MRN: [id number] DOB:1960/10/20, 63 y.o., male Today's Date: 07/30/2023  PHYSICAL THERAPY DISCHARGE SUMMARY  Visits from Start of Care: 7   Current functional level related to goals / functional outcomes: See objective findings/assessment   Remaining deficits: See objective findings/assessment    Education / Equipment: See today's treatment/assessment      Patient agrees to discharge. Patient goals were met. Patient is being discharged due to being pleased with the current functional level.     END OF SESSION:  PT End of Session - 07/30/23 1046     Visit Number 7    Number of Visits 17    Date for PT Re-Evaluation 08/27/23    Authorization Type [employer] MCD    PT Start Time 1047    PT Stop Time 1117    PT Time Calculation (min) 30 min    Activity Tolerance Patient tolerated treatment well    Behavior During Therapy WFL for tasks assessed/performed              Past Medical History:  Diagnosis Date   Autoimmune hepatitis (HCC)    Cirrhosis of liver (HCC)    Hypertension    Thrombocytopenia (HCC)    Past Surgical History:  Procedure Laterality Date   ESOPHAGEAL BANDING  10/20/2022   Procedure: ESOPHAGEAL BANDING;  Surgeon: Beverley Fiedler, MD;  Location: [facility] ENDOSCOPY;  Service: Gastroenterology;;   ESOPHAGEAL BANDING  11/11/2022   Procedure: ESOPHAGEAL BANDING;  Surgeon: Beverley Fiedler, MD;  Location: [facility] ENDOSCOPY;  Service: Gastroenterology;;   ESOPHAGEAL BANDING N/A 12/07/2022   Procedure: ESOPHAGEAL BANDING;  Surgeon: Hilarie Fredrickson, MD;  Location: [facility] ENDOSCOPY;  Service: Gastroenterology;  Laterality: N/A;   ESOPHAGOGASTRODUODENOSCOPY N/A 10/20/2022   Procedure: ESOPHAGOGASTRODUODENOSCOPY (EGD);  Surgeon: Beverley Fiedler, MD;  Location: [facility] ENDOSCOPY;  Service: Gastroenterology;  Laterality: N/A;  per ICU   ESOPHAGOGASTRODUODENOSCOPY (EGD) WITH PROPOFOL N/A 11/11/2022   Procedure: ESOPHAGOGASTRODUODENOSCOPY  (EGD) WITH PROPOFOL;  Surgeon: Beverley Fiedler, MD;  Location: [facility] ENDOSCOPY;  Service: Gastroenterology;  Laterality: N/A;   ESOPHAGOGASTRODUODENOSCOPY (EGD) WITH PROPOFOL N/A 12/07/2022   Procedure: ESOPHAGOGASTRODUODENOSCOPY (EGD) WITH PROPOFOL;  Surgeon: Hilarie Fredrickson, MD;  Location: [facility] ENDOSCOPY;  Service: Gastroenterology;  Laterality: N/A;   ESOPHAGOGASTRODUODENOSCOPY (EGD) WITH PROPOFOL N/A 03/01/2023   Procedure: ESOPHAGOGASTRODUODENOSCOPY (EGD) WITH PROPOFOL;  Surgeon: Beverley Fiedler, MD;  Location: [facility] ENDOSCOPY;  Service: Gastroenterology;  Laterality: N/A;   Patient Active Problem List   Diagnosis Date Noted   History of hepatitis C 01/05/2023   Esophageal varices with bleeding in diseases classified elsewhere (HCC) 10/20/2022   Portal hypertension with esophageal varices (HCC) 10/20/2022   Multiple benign nevi 01/27/2022   Thrombocytopenia (HCC) 10/24/2015   Liver cirrhosis (HCC) 10/24/2015   Autoimmune hepatitis (HCC) 03/28/2013   Essential hypertension 11/15/2012    PCP: Storm Frisk, MD  REFERRING PROVIDER: Hoy Register, MD  REFERRING DIAG: Right leg weakness [R29.898]   THERAPY DIAG:  Pain in right leg  Muscle weakness (generalized)  Other abnormalities of gait and mobility  Rationale for Evaluation and Treatment: Rehabilitation  ONSET DATE: 12/05/2022  SUBJECTIVE:   SUBJECTIVE STATEMENT: Pt reports that he is not having any pain and is not relying on SPC. He states that he is ready to be discharged from PT. He plans to get a gym membership.    PERTINENT HISTORY: Relevant PMHx includes autoimmune hepatitis and cirrhosis of liver, HTN, thrombocytopenia  PAIN:  Are you  having pain? Yes: NPRS scale: 0-7/10 Pain location: R LE Pain description: "hanging on", "limber"   Aggravating factors: prolonged walking, getting in/out of the car Relieving factors: rest  PRECAUTIONS: Fall  RED FLAGS: None   WEIGHT BEARING RESTRICTIONS: No  FALLS:  Has  patient fallen in last 6 months? Yes. Number of falls a few while trying to run in the backyard   LIVING ENVIRONMENT: Lives with:  "I've got a room but I live with someone"  Lives in: House/apartment Stairs: No Has following equipment at home: Single point cane, Walker - 4 wheeled, and Grab bars  OCCUPATION: n/a  PLOF: Independent  PATIENT GOALS: To be able to walk longer without pain/difficulty.   NEXT MD VISIT: 09/13/22  OBJECTIVE:  Note: Objective measures were completed at Evaluation unless otherwise noted.  DIAGNOSTIC FINDINGS:   no relevant imaging results available in epic; none included in referral   PATIENT SURVEYS:  FOTO 55 current, 70 predicted  COGNITION: Overall cognitive status: Within functional limits for tasks assessed     SENSATION: Not tested   LOWER EXTREMITY MMT:  MMT Right eval Left eval Right 07/30/23 Left 07/30/23  Hip flexion 3 4+ 4 4+  Hip extension 2 3+    Hip abduction 3 4- 4+ 4+  Hip adduction 4- 4- 4+ 4+  Hip internal rotation 4 4 4+ 5  Hip external rotation 4 4 4+ 5  Knee flexion 4- 4 5 5   Knee extension 4 4+ 4+ 4+  Ankle dorsiflexion 4 4+    Ankle plantarflexion      Ankle inversion      Ankle eversion       (Blank rows = not tested)   FUNCTIONAL TESTS:  2 minute walk test: 232ft MCTSIB: Condition 1: Avg of 3 trials: 30 sec, Condition 2: Avg of 3 trials: 30 sec, Condition 3: Avg of 3 trials: 30 sec, Condition 4: Avg of 3 trials: 27 sec, and Total Score: 117/120  07/30/23: 30 sec in all conditions   GAIT: Distance walked: see Assistive device utilized: Single point cane on Rt Level of assistance: Modified independence Comments: increased R hip ER, increased R toe out, diminished L step length and gait speed  07/21/23: No longer using AD                                                                                                                                TREATMENT DATE:   [facility] Adult PT Treatment:                                                 DATE: 07/30/23 Therapeutic Exercise: Bike level 2 x 5' Cybex leg press - knees at ~ 90 - 2x10 - 60# S/L @ 30# 2x10  Therapeutic Activity:  Reassessment  of objective measures and subjective assessment regarding progress towards established goals and plan for independence with prescribed home program following discharged from PT   [facility] Adult PT Treatment:                                                DATE: 07/28/23 Therapeutic Exercise: Bike level 3 x 5' Standing heel/toe raises 2x20 SL 10x2 ea Leaning against wall 2x10 bil Slant board stretch - 1'  Omega knee extension 10# 2x10 - S/L Omega knee flexion 25# 2x10 S/L  Cybex leg press - knees at ~ 90 - 2x10 - 40# Sl @ 30# 2x10  [facility] Adult PT Treatment:                                                DATE: 07/16/23 Therapeutic Exercise: Bike 5' Standing heel/toe raises 2x10 Standing hip abduction 2x10 BIL (cues for form and pacing) Standing marching 2x15 (cues for form and pacing) LAQ 3" hold at top 3x10 BIL - 5# supine hip adduction with pilates ring 5" hold 2x10 Seated clam - Black TB - 3x10 Seated knee flexion Black TB 2x10 BIL SLR 2x10 BIL Bridges 2x10 Sit to stand from raised table - 2x10  Consider: rocker board and step up    PATIENT EDUCATION:  Education details: reviewed initial home exercise program; discussion of POC, prognosis and goals for skilled PT; correct usage of SPC (L UE)  Person educated: Patient Education method: Explanation, Demonstration, and Handouts Education comprehension: verbalized understanding, returned demonstration, and needs further education  HOME EXERCISE PROGRAM: Access Code: [email or web address] URL: [email or web address] Date: 07/30/2023 Prepared by: Mauri Reading  Exercises - Seated Hip Abduction with Resistance  - 2 x daily - 7 x weekly - 2 sets - 10 reps - 3 sec hold - Seated Hip Adduction Isometrics with Ball  - 2 x daily - 7 x weekly - 2  sets - 10 reps - Seated Knee Extension with Resistance  - 2 x daily - 7 x weekly - 2 sets - 10 reps - Seated March with Resistance  - 2 x daily - 7 x weekly - 2 sets - 10 reps - Standing Romberg to 3/4 Tandem Stance  - 1 x daily - 7 x weekly - 1 sets - 3 reps - 45'' hold - Walking March  - 1 x daily - 7 x weekly - 3 sets - 10 reps  ASSESSMENT:  CLINICAL IMPRESSION: Aarian has attended 7 total PT sessions d/t Right Lower Extremity Weakness and related balance deficits. At this time, he has met all established rehab goals, including improved LE MMT scores. He understands current HEP and plans to establish gym membership to continue working on strength and overall fitness. He will be discharged from skilled PT today and will return to care of referring provider as needed.     OBJECTIVE IMPAIRMENTS: Abnormal gait, decreased activity tolerance, decreased balance, decreased mobility, decreased strength, increased muscle spasms, improper body mechanics, and pain.   ACTIVITY LIMITATIONS: squatting, stairs, transfers, dressing, locomotion level, and car transfers  PARTICIPATION LIMITATIONS: cleaning, shopping, community activity, occupation, and yard work  PERSONAL FACTORS: Past/current experiences, Time since onset of injury/illness/exacerbation, and 1-2 comorbidities: Relevant PMHx includes autoimmune  hepatitis and cirrhosis of liver, HTN, thrombocytopenia  are also affecting patient's functional outcome.   REHAB POTENTIAL: Good  CLINICAL DECISION MAKING: Stable/uncomplicated  EVALUATION COMPLEXITY: Low   GOALS: Goals reviewed with patient? Yes  SHORT TERM GOALS: Target date: 07/30/2023   Patient will be independent with initial home program for LE strengthening.  Baseline: provided at eval  Goal status: MET  2.  Patient will demonstrate ability to safely ambulate throughout the clinic using SPC in left UE without LOB.  Baseline: using SPC in right UE Goal status: MET 07/21/23: Patient  able to ambulate without AD with no LOB   LONG TERM GOALS: Target date: 08/27/2023   Patient will report improved overall functional ability with FOTO score of 70 or greater.  Baseline: 55 07/30/23: 85 Goal status: MET  2.  Patient will improved distance to 280ft or greater.  Baseline: 235 ft 07/30/23: 450 ft  Goal status: MET  3.  Patient will demonstrate ability to navigate uneven surfaces and obstacles while walking using LRAD and ability to safely recover during instances of LOB.  Baseline: limited static standing balance on uneven surfaces with EC 07/30/23: Ambulating without AD; no instances of LOB; able to maintain static balance during MCTSIB  Goal status: Partially MET; dynamic balance/gait on uneven surfaces to addressed prior to d/c  4.  Patient will report ability to perform car transfers with minimal-to-no pain or difficulty.  Baseline: moderate-to-severe difficulty getting R LE into his vehicle Goal status: MET; 07/30/23 "I don't have any difficulty or pain with that anymore"   5.  Patient will demonstrate at least 4/5 MMT with R LE strength testing in order to improve overall function.  Baseline: see objective measures Goal status: MET     PLAN:  PT FREQUENCY: 1-2x/week  PT DURATION: 8 weeks  PLANNED INTERVENTIONS: 97164- PT Re-evaluation, 97110-Therapeutic exercises, 97530- Therapeutic activity, [id number]- Neuromuscular re-education, 97535- Self Care, 16109- Manual therapy, [id number]- Aquatic Therapy, 97014- Electrical stimulation (unattended), [phone]- Electrical stimulation (manual), Patient/Family education, Balance training, Taping, Dry Needling, Joint mobilization, Cryotherapy, and Moist heat  PLAN FOR NEXT SESSION: discharge with updated HEP    Mauri Reading, PT, DPT  07/30/2023 11:02 AM

## 2023-08-04 ENCOUNTER — Ambulatory Visit: Payer: Medicaid Other

## 2023-08-05 ENCOUNTER — Other Ambulatory Visit: Payer: Self-pay

## 2023-08-06 ENCOUNTER — Other Ambulatory Visit: Payer: Self-pay

## 2023-08-11 ENCOUNTER — Encounter: Payer: Self-pay | Admitting: Family Medicine

## 2023-08-11 ENCOUNTER — Other Ambulatory Visit: Payer: Self-pay

## 2023-08-11 ENCOUNTER — Ambulatory Visit: Payer: MEDICAID | Attending: Family Medicine | Admitting: Family Medicine

## 2023-08-11 VITALS — BP 134/83 | HR 61 | Ht 74.0 in | Wt 199.4 lb

## 2023-08-11 DIAGNOSIS — Z029 Encounter for administrative examinations, unspecified: Secondary | ICD-10-CM | POA: Diagnosis not present

## 2023-08-11 DIAGNOSIS — K7460 Unspecified cirrhosis of liver: Secondary | ICD-10-CM | POA: Diagnosis not present

## 2023-08-11 MED ORDER — COVID-19 MRNA VAC-TRIS(PFIZER) 30 MCG/0.3ML IM SUSY
0.3000 mL | PREFILLED_SYRINGE | Freq: Once | INTRAMUSCULAR | 0 refills | Status: AC
  Filled 2023-08-11: qty 0.3, 1d supply, fill #0

## 2023-08-11 NOTE — Progress Notes (Signed)
 Subjective:  Patient ID: Timothy [name], male    DOB: 1960/08/13  Age: 63 y.o. MRN: [id number]  CC: Letter for School/Work (Needs letter for disability)   HPI Willia [name] is a 63 y.o. 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  He requests a letter for disability stating he is unable to work due to his health conditions.  He received a letter from his gastroenterologist indicating he is disabled and unable to work.  Last GI note reviewed which revealed eradication of varices from most recent endoscopy.    At his last visit he had complained of weakness in his lower extremities to the point where he had used a cane to ambulate.  Physical exam had revealed reduced strength in right lower extremity compared to the left.  He reports improvement in his weakness after physical therapy. He also mentions a referral to a nephrologist due to previously low kidney function, but the nephrologist's office has not scheduled an appointment as he prioritizes more severe cases. The patient's kidney function has since normalized.    Past Medical History:  Diagnosis Date   Autoimmune hepatitis (HCC)    Cirrhosis of liver (HCC)    Hypertension    Thrombocytopenia (HCC)     Past Surgical History:  Procedure Laterality Date   ESOPHAGEAL BANDING  10/20/2022   Procedure: ESOPHAGEAL BANDING;  Surgeon: Albertus Gordy HERO, MD;  Location: [facility] ENDOSCOPY;  Service: Gastroenterology;;   ESOPHAGEAL BANDING  11/11/2022   Procedure: ESOPHAGEAL BANDING;  Surgeon: Albertus Gordy HERO, MD;  Location: [facility] ENDOSCOPY;  Service: Gastroenterology;;   ESOPHAGEAL BANDING N/A 12/07/2022   Procedure: ESOPHAGEAL BANDING;  Surgeon: Abran Timothy SAILOR, MD;  Location: [facility] ENDOSCOPY;  Service:  Gastroenterology;  Laterality: N/A;   ESOPHAGOGASTRODUODENOSCOPY N/A 10/20/2022   Procedure: ESOPHAGOGASTRODUODENOSCOPY (EGD);  Surgeon: Albertus Gordy HERO, MD;  Location: [facility] ENDOSCOPY;  Service: Gastroenterology;  Laterality: N/A;  per ICU   ESOPHAGOGASTRODUODENOSCOPY (EGD) WITH PROPOFOL  N/A 11/11/2022   Procedure: ESOPHAGOGASTRODUODENOSCOPY (EGD) WITH PROPOFOL ;  Surgeon: Albertus Gordy HERO, MD;  Location: [facility] ENDOSCOPY;  Service: Gastroenterology;  Laterality: N/A;   ESOPHAGOGASTRODUODENOSCOPY (EGD) WITH PROPOFOL  N/A 12/07/2022   Procedure: ESOPHAGOGASTRODUODENOSCOPY (EGD) WITH PROPOFOL ;  Surgeon: Abran Timothy SAILOR, MD;  Location: [facility] ENDOSCOPY;  Service: Gastroenterology;  Laterality: N/A;   ESOPHAGOGASTRODUODENOSCOPY (EGD) WITH PROPOFOL  N/A 03/01/2023   Procedure: ESOPHAGOGASTRODUODENOSCOPY (EGD) WITH PROPOFOL ;  Surgeon: Albertus Gordy HERO, MD;  Location: [facility] ENDOSCOPY;  Service: Gastroenterology;  Laterality: N/A;    Family History  Problem Relation Age of Onset   Hypertension Sister     Social History   Socioeconomic History   Marital status: Single    Spouse name: Not on file   Number of children: 0   Years of education: Not on file   Highest education level: Not on file  Occupational History   Occupation: retired  Tobacco Use   Smoking status: Never   Smokeless tobacco: Never  Vaping Use   Vaping status: Never Used  Substance and Sexual Activity   Alcohol use: No    Alcohol/week: 0.0 standard drinks of alcohol   Drug use: No   Sexual activity: Yes  Other Topics Concern   Not on file  Social History Narrative   Not on file   Social Drivers  of Health   Financial Resource Strain: Not on file  Food Insecurity: Food Insecurity Present (06/15/2023)   Hunger Vital Sign    Worried About Running Out of Food in the Last Year: Never true    Ran Out of Food in the Last Year: Sometimes true  Transportation Needs: No Transportation Needs (06/15/2023)   PRAPARE - Scientist, Research (physical Sciences) (Medical): No    Lack of Transportation (Non-Medical): No  Physical Activity: Not on file  Stress: Not on file  Social Connections: Unknown (06/15/2023)   Social Connection and Isolation Panel [NHANES]    Frequency of Communication with Friends and Family: Three times a week    Frequency of Social Gatherings with Friends and Family: Three times a week    Attends Religious Services: Not on file    Active Member of Clubs or Organizations: Not on file    Attends Banker Meetings: Not on file    Marital Status: Not on file    Allergies  Allergen Reactions   Nsaids     Patient denies any allergy to NSAIDs (particular Advil ) He takes this medication frequently without any adverse side effects     Outpatient Medications Prior to Visit  Medication Sig Dispense Refill   azaTHIOprine  (IMURAN ) 50 MG tablet Take 1 tablet (50 mg total) by mouth daily. 90 tablet 1   Blood Pressure Monitoring (BLOOD PRESSURE KIT) DEVI Use to measure blood pressure 1 each 0   carvedilol  (COREG ) 3.125 MG tablet Take 1 tablet (3.125 mg total) by mouth in the morning and 1 tablet (3.125 mg total) in the evening. Take with meals. 180 tablet 3   cyclobenzaprine  (FLEXERIL ) 10 MG tablet Take 1 tablet (10 mg total) by mouth at bedtime as needed for muscle spasms. 30 tablet 1   docusate sodium  (COLACE) 100 MG capsule Take 1 capsule (100 mg total) by mouth 2 (two) times daily as needed for mild constipation. 10 capsule 0   predniSONE  (DELTASONE ) 5 MG tablet Take 1 tablet (5 mg total) by mouth daily. 90 tablet 1   spironolactone  (ALDACTONE ) 100 MG tablet Take 1 tablet (100 mg total) by mouth daily. (Patient taking differently: Take 50 mg by mouth daily.) 90 tablet 11   No facility-administered medications prior to visit.     ROS Review of Systems  Constitutional:  Negative for activity change and appetite change.  HENT:  Negative for sinus pressure and sore throat.   Respiratory:  Negative for  chest tightness, shortness of breath and wheezing.   Cardiovascular:  Negative for chest pain and palpitations.  Gastrointestinal:  Negative for abdominal distention, abdominal pain and constipation.  Genitourinary: Negative.   Musculoskeletal: Negative.   Psychiatric/Behavioral:  Negative for behavioral problems and dysphoric mood.    Objective:  BP 134/83   Pulse 61   Ht 6' 2 (1.88 m)   Wt 199 lb 6.4 oz (90.4 kg)   SpO2 99%   BMI 25.60 kg/m      08/11/2023    2:57 PM 07/15/2023    8:27 AM 06/15/2023    2:16 PM  BP/Weight  Systolic BP 134 110 120  Diastolic BP 83 70 70  Wt. (Lbs) 199.4 198 199.6  BMI 25.6 kg/m2 25.42 kg/m2 25.63 kg/m2      Physical Exam Constitutional:      Appearance: He is well-developed.  Cardiovascular:     Rate and Rhythm: Normal rate.     Heart sounds: Normal heart sounds.  No murmur heard. Pulmonary:     Effort: Pulmonary effort is normal.     Breath sounds: Normal breath sounds. No wheezing or rales.  Chest:     Chest wall: No tenderness.  Abdominal:     General: Bowel sounds are normal. There is no distension.     Palpations: Abdomen is soft. There is no mass.     Tenderness: There is no abdominal tenderness.  Musculoskeletal:        General: Normal range of motion.     Right lower leg: No edema.     Left lower leg: No edema.  Neurological:     Mental Status: He is alert and oriented to person, place, and time.  Psychiatric:        Mood and Affect: Mood normal.        Latest Ref Rng & Units 07/15/2023    9:06 AM 06/15/2023    2:44 PM 03/16/2023    2:11 PM  CMP  Glucose 70 - 99 mg/dL 82  89  87   BUN 6 - 23 mg/dL 24  18  18    Creatinine 0.40 - 1.50 mg/dL 8.71  8.59  8.64   Sodium 135 - 145 mEq/L 141  142  141   Potassium 3.5 - 5.1 mEq/L 4.2  4.8  5.0   Chloride 96 - 112 mEq/L 108  107  107   CO2 19 - 32 mEq/L 26  24  24    Calcium 8.4 - 10.5 mg/dL 9.5  9.7  9.4   Total Protein 6.0 - 8.3 g/dL 7.6  7.3  7.3   Total Bilirubin 0.2 -  1.2 mg/dL 1.2  1.0  0.8   Alkaline Phos 39 - 117 U/L 83  96  95   AST 0 - 37 U/L 67  72  80   ALT 0 - 53 U/L 43  48  46     Lipid Panel     Component Value Date/Time   CHOL 159 01/27/2022 0912   TRIG 50 01/27/2022 0912   HDL 62 01/27/2022 0912   CHOLHDL 2.6 01/27/2022 0912   CHOLHDL 2.8 03/29/2015 1111   VLDL 14 03/29/2015 1111   LDLCALC 86 01/27/2022 0912    CBC    Component Value Date/Time   WBC 2.9 (L) 07/15/2023 0906   RBC 4.09 (L) 07/15/2023 0906   HGB 12.7 (L) 07/15/2023 0906   HGB 11.7 (L) 03/16/2023 1411   HGB 14.5 01/27/2016 1005   HCT 38.4 (L) 07/15/2023 0906   HCT 36.4 (L) 03/16/2023 1411   HCT 42.1 01/27/2016 1005   PLT 62.0 (L) 07/15/2023 0906   PLT 95 (LL) 03/16/2023 1411   MCV 93.9 07/15/2023 0906   MCV 81 03/16/2023 1411   MCV 84.7 01/27/2016 1005   MCH 26.2 (L) 03/16/2023 1411   MCH 30.2 10/23/2022 0113   MCHC 33.0 07/15/2023 0906   RDW 16.9 (H) 07/15/2023 0906   RDW 21.9 (H) 03/16/2023 1411   RDW 15.6 (H) 01/27/2016 1005   LYMPHSABS 0.7 07/15/2023 0906   LYMPHSABS 0.9 03/16/2023 1411   LYMPHSABS 1.8 01/27/2016 1005   MONOABS 0.4 07/15/2023 0906   MONOABS 0.5 01/27/2016 1005   EOSABS 0.2 07/15/2023 0906   EOSABS 0.2 03/16/2023 1411   BASOSABS 0.0 07/15/2023 0906   BASOSABS 0.0 03/16/2023 1411   BASOSABS 0.0 01/27/2016 1005    Lab Results  Component Value Date   HGBA1C 5.9 (H) 06/11/2020    Assessment & Plan:  Liver Cirrhosis with Portal Hypertension -Secondary to autoimmune hepatitis and hepatitis C Stable on current management. Patient is on immunosuppressants-prednisone  as azathioprine  -Mildly elevated AST from last month -Per GI he is disabled and unable to work -Write a physicist, medical for the patient's lawyer  -Follow-up with GI for repeat endoscopy           No orders of the defined types were placed in this encounter.   Follow-up: Return in about 3 months (around 11/08/2023) for Chronic medical conditions, cancel previous  appointment.       Corrina Sabin, MD, FAAFP. [facility] [place], [place] [phone]   08/11/2023, 5:27 PM

## 2023-08-11 NOTE — Patient Instructions (Signed)
 VISIT SUMMARY:  During today's visit, we discussed your ongoing health conditions, including liver cirrhosis, hypertension, and previously low kidney function. We reviewed your current treatment plans and made necessary adjustments. Additionally, we addressed your request for a disability letter.  YOUR PLAN:  -LIVER CIRRHOSIS WITH PORTAL HYPERTENSION: Liver cirrhosis is a condition where the liver is severely scarred, affecting its function. Portal hypertension is increased blood pressure within the portal vein system, which can occur due to cirrhosis. Your condition is stable with your current treatment, but due to your immunosuppressed state and overall health, you are unable to maintain employment per your GI. A letter will be provided to your lawyer stating your inability to work due to these medical conditions.  -HYPERTENSION: Hypertension, or high blood pressure, is a condition where the force of the blood against your artery walls is too high. Your blood pressure is stable with your current medication, so you should continue with your current treatment regimen.  -RENAL FUNCTION: Your kidney function, which was previously abnormal, has now returned to normal. No further action is required at this time.  -GENERAL HEALTH MAINTENANCE: To support your overall health, a low sodium diet plan will be provided. This can help manage your blood pressure and support liver function.  INSTRUCTIONS:  Please continue with your current management and follow up as needed. A letter for your lawyer regarding your inability to work will be prepared.

## 2023-08-30 ENCOUNTER — Other Ambulatory Visit (HOSPITAL_COMMUNITY): Payer: Self-pay

## 2023-09-03 ENCOUNTER — Telehealth: Payer: Self-pay | Admitting: Internal Medicine

## 2023-09-03 NOTE — Telephone Encounter (Signed)
 Inbound call from patient stating that he is taking Prednisone and every time  he goes to the pharmacy the prescription says one refill remaining. Patient is requesting a call to discuss if he needs to continue to take the medication. Please advise.

## 2023-09-03 NOTE — Telephone Encounter (Signed)
 Left message for pt to call back

## 2023-09-06 ENCOUNTER — Other Ambulatory Visit: Payer: Self-pay

## 2023-09-06 DIAGNOSIS — I8500 Esophageal varices without bleeding: Secondary | ICD-10-CM | POA: Diagnosis not present

## 2023-09-06 DIAGNOSIS — K754 Autoimmune hepatitis: Secondary | ICD-10-CM | POA: Diagnosis not present

## 2023-09-06 DIAGNOSIS — K7460 Unspecified cirrhosis of liver: Secondary | ICD-10-CM | POA: Diagnosis not present

## 2023-09-06 DIAGNOSIS — R188 Other ascites: Secondary | ICD-10-CM | POA: Diagnosis not present

## 2023-09-06 NOTE — Telephone Encounter (Signed)
 Patient called stated he is waiting to hear back from a nurse. Advise him someone did try to reach him and a message was left for him. He asked for a call back to discuss his needs further.

## 2023-09-06 NOTE — Telephone Encounter (Signed)
 Pt stated that he had questions about his prednisone prescription.  Prescription was reviewed and explained to pt. Pt verbalized understanding with all questions answered.

## 2023-09-13 ENCOUNTER — Ambulatory Visit: Payer: Medicaid Other | Admitting: Family Medicine

## 2023-09-16 ENCOUNTER — Other Ambulatory Visit: Payer: Self-pay

## 2023-09-16 ENCOUNTER — Telehealth: Payer: Self-pay | Admitting: Internal Medicine

## 2023-09-16 MED ORDER — AMOXICILLIN 875 MG PO TABS
875.0000 mg | ORAL_TABLET | Freq: Two times a day (BID) | ORAL | 0 refills | Status: AC
  Filled 2023-09-16: qty 20, 10d supply, fill #0

## 2023-09-16 NOTE — Telephone Encounter (Signed)
 Patient called said he has a dental appt today at 1:00 pm and they gave him a medication he would like to know if he can take since he has issues with his liver.

## 2023-09-16 NOTE — Telephone Encounter (Signed)
 Pt states he saw the dentist and was prescribed amoxicillin 875mg . He wanted to make sure it is ok foe him to take since he has cirrhosis. Discussed with pt that it will be fine for him to take the antibiotic.

## 2023-09-22 ENCOUNTER — Encounter: Payer: Self-pay | Admitting: Internal Medicine

## 2023-09-22 ENCOUNTER — Other Ambulatory Visit: Payer: Self-pay

## 2023-09-22 ENCOUNTER — Ambulatory Visit (INDEPENDENT_AMBULATORY_CARE_PROVIDER_SITE_OTHER): Payer: Medicaid Other | Admitting: Internal Medicine

## 2023-09-22 VITALS — BP 122/80 | HR 62 | Ht 74.0 in | Wt 191.0 lb

## 2023-09-22 DIAGNOSIS — I8500 Esophageal varices without bleeding: Secondary | ICD-10-CM | POA: Diagnosis not present

## 2023-09-22 DIAGNOSIS — K754 Autoimmune hepatitis: Secondary | ICD-10-CM | POA: Diagnosis not present

## 2023-09-22 DIAGNOSIS — I8511 Secondary esophageal varices with bleeding: Secondary | ICD-10-CM

## 2023-09-22 DIAGNOSIS — Z1211 Encounter for screening for malignant neoplasm of colon: Secondary | ICD-10-CM

## 2023-09-22 MED ORDER — PREDNISONE 10 MG PO TABS
10.0000 mg | ORAL_TABLET | Freq: Every day | ORAL | 1 refills | Status: DC
  Filled 2023-09-22: qty 30, 30d supply, fill #0
  Filled 2023-10-21: qty 30, 30d supply, fill #1

## 2023-09-22 MED ORDER — PREDNISONE 5 MG PO TABS
10.0000 mg | ORAL_TABLET | Freq: Every day | ORAL | 1 refills | Status: DC
  Filled 2023-09-22: qty 60, 30d supply, fill #0

## 2023-09-22 NOTE — Patient Instructions (Addendum)
 Increase your prednisone to 10 mg daily. We have sent a prescription to your pharmacy.   Stay on azathioprine, carvedilol and spironolactone at current doses.   We will contact you to schedule your hospital procedures for August and your follow up visit for July when the schedule becomes available.     _______________________________________________________  If your blood pressure at your visit was 140/90 or greater, please contact your primary care physician to follow up on this.  _______________________________________________________  If you are age 64 or older, your body mass index should be between 23-30. Your Body mass index is 24.52 kg/m. If this is out of the aforementioned range listed, please consider follow up with your Primary Care Provider.  If you are age 3 or younger, your body mass index should be between 19-25. Your Body mass index is 24.52 kg/m. If this is out of the aformentioned range listed, please consider follow up with your Primary Care Provider.   ________________________________________________________  The [facility] providers would like to encourage you to use [facility] to communicate with providers for non-urgent requests or questions.  Due to long hold times on the telephone, sending your provider a message by [facility] may be a faster and more efficient way to get a response.  Please allow 48 business hours for a response.  Please remember that this is for non-urgent requests.  _______________________________________________________

## 2023-09-26 ENCOUNTER — Encounter: Payer: Self-pay | Admitting: Internal Medicine

## 2023-09-26 NOTE — Progress Notes (Signed)
 Subjective:    Patient ID: Timothy [name], male    DOB: 1961/07/04, 63 y.o.   MRN: [id number]  HPI Timothy [name] is a 63 year old male with a history of autoimmune hepatitis cirrhosis with prior esophageal varices status post EVL, ascites, nonocclusive portal vein thrombosis who is here for follow-up.  He is here alone today.   He is also followed locally by [facility].  He did see them in January.  He last visited the liver clinic in January and has been on azathioprine 50 mg daily and prednisone 5 mg daily to manage liver inflammation. Despite these medications, recent lab work indicates persistent mild inflammation in the liver. He underwent blood work two weeks ago at [facility] but has not yet received feedback from that visit.  Fluid accumulation has been stable, indicating some stability in his condition. No gastrointestinal bleeding or blood in stool. He reports eating well, although he is currently unable to eat as well due to having new dentures that don't fit well yet due to swollen gums.  He is overdue for colon cancer screening, as it has been over three years since his last Cologuard test. He is considering a colonoscopy, which could be scheduled alongside his August procedure.  Review of Systems As per HPI, otherwise negative  Current Medications, Allergies, Past Medical History, Past Surgical History, Family History and Social History were reviewed in [employer] record.    Objective:   Physical Exam BP 122/80   Pulse 62   Ht 6\' 2"  (1.88 m)   Wt 191 lb (86.6 kg)   BMI 24.52 kg/m  Gen: awake, alert, NAD HEENT: anicteric  Pulm: CTA b/l Abd: soft, NT/ND, +BS throughout, no ascites Ext: no c/c/e Neuro: nonfocal, no asterixis  Labs 09/06/2023 White count 3.1, Hemoglobin 13.4, MCV 93 Platelet count 48 Creatinine 1.36 Albumin 3.7 AST 85, ALT 60, alkaline phosphatase 115, total bili 0.9 INR 1.2  Lab Results  Component Value Date   INR 1.3 (H)  07/15/2023   INR 1.4 (H) 11/04/2022   INR 1.3 (H) 10/20/2022   Last CT abdomen pelvis with contrast dated 02/10/2023 reviewed    Assessment & Plan:  Autoimmune Hepatitis Chronic autoimmune hepatitis with mild liver inflammation. Liver enzymes slightly elevated. Plan to increase prednisone to control inflammation. Explained rationale for prednisone increase to normalize liver enzymes and reduce liver damage. He agreed. - Increase prednisone to 10 mg daily. - Continue azathioprine 50 mg daily.  No room really to increase azathioprine given slight leukopenia. - Consult liver clinic to confirm treatment adjustment, Dawn Drazek. -Repeat CBC, CMP and INR and IgG in 4 to 6 weeks after prednisone adjustment.  Esophageal Varices Esophageal varices with previous banding. No recent bleeding. Surveillance endoscopy scheduled to assess recurrence and prevent bleeding. - Schedule surveillance endoscopy in August 2025 for variceal screening.  Colon Cancer Screening Due for colon cancer screening. Discussed options; he prefers colonoscopy for comprehensive assessment and polyp removal.  We cannot find a Cologuard result for quite some time.  Plan colonoscopy in August with upper endoscopy to consolidate need for anesthesia. - Schedule colonoscopy in August concurrently with upper endoscopy.  Follow-up Follow-up planned to monitor liver condition and prepare for procedures. He desires close monitoring due to cirrhosis history and previous bleeding. - Schedule follow-up appointment in June or early July to monitor liver condition and prepare for August procedures.  30 minutes total spent today including patient facing time, coordination of care, reviewing medical history/procedures/pertinent radiology  studies, and documentation of the encounter.

## 2023-10-02 ENCOUNTER — Other Ambulatory Visit (HOSPITAL_COMMUNITY): Payer: Self-pay

## 2023-10-02 MED ORDER — AMOXICILLIN 875 MG PO TABS
875.0000 mg | ORAL_TABLET | Freq: Two times a day (BID) | ORAL | 0 refills | Status: DC
Start: 2023-09-29 — End: 2023-11-08
  Filled 2023-10-02: qty 20, 10d supply, fill #0

## 2023-10-12 ENCOUNTER — Other Ambulatory Visit: Payer: Self-pay

## 2023-10-13 ENCOUNTER — Other Ambulatory Visit: Payer: Self-pay

## 2023-10-20 ENCOUNTER — Encounter (INDEPENDENT_AMBULATORY_CARE_PROVIDER_SITE_OTHER): Payer: Self-pay | Admitting: Primary Care

## 2023-10-20 ENCOUNTER — Ambulatory Visit (INDEPENDENT_AMBULATORY_CARE_PROVIDER_SITE_OTHER): Admitting: Primary Care

## 2023-10-20 VITALS — BP 129/86 | HR 69 | Resp 16 | Ht 74.0 in | Wt 190.4 lb

## 2023-10-20 DIAGNOSIS — G8929 Other chronic pain: Secondary | ICD-10-CM

## 2023-10-20 DIAGNOSIS — R21 Rash and other nonspecific skin eruption: Secondary | ICD-10-CM

## 2023-10-20 DIAGNOSIS — L235 Allergic contact dermatitis due to other chemical products: Secondary | ICD-10-CM | POA: Diagnosis not present

## 2023-10-20 DIAGNOSIS — M25561 Pain in right knee: Secondary | ICD-10-CM | POA: Diagnosis not present

## 2023-10-20 MED ORDER — TRAMADOL HCL 50 MG PO TABS: 50.0000 mg | ORAL_TABLET | Freq: Three times a day (TID) | ORAL | 0 refills | Status: DC | PRN

## 2023-10-20 MED ORDER — TRAMADOL HCL 50 MG PO TABS: 50.0000 mg | ORAL_TABLET | Freq: Three times a day (TID) | ORAL | 0 refills | Status: AC | PRN

## 2023-10-20 MED ORDER — TRIAMCINOLONE ACETONIDE 0.1 % EX CREA
1.0000 | TOPICAL_CREAM | Freq: Two times a day (BID) | CUTANEOUS | 0 refills | Status: DC
  Filled 2023-10-21: qty 30, 7d supply, fill #0

## 2023-10-20 NOTE — Progress Notes (Signed)
 [facility]  Timothy [name], is a 63 y.o. male  [id number]  [id number]  DOB - 1960/12/02  Chief Complaint  Patient presents with   Rash   Knee Pain    Right        Subjective:   Timothy [name] is a 62 y.o. male here today for a acute visit. He has HTN - Bp unremarkable .Patient has No headache, No chest pain, No abdominal pain - No Nausea, No new weakness tingling or numbness, No Cough - shortness of breath.  Patient recently had teeth removed and placed on amoxicillin.  He took this medication for the first time only to take 20 pills with no problems.  He returned to the dentist evidently infection was still present and he represcribed amoxicillin he has been taking it now for 2 weeks.  Today he is concerned about the rash that has appeared in the last week.  Explained this was likely contact dermatitis. Contact dermatitis is hypersensitivity reaction to a substance causing cellular immunity response. This may cause papules, vesicles, bullae with surrounding erythema and pruritic. He denies changing soaps, deodorants, laundry detergent or environmental- grass, weeds and trees.  The only thing that he has introduced that was new was the amoxicillin second round course. Rash This is a new problem. The current episode started in the past 7 days. The problem has been gradually worsening since onset. The affected locations include the abdomen, back, right arm and left arm. The rash is characterized by redness and swelling (red raised bumps). He was exposed to a new medication. Associated symptoms include shortness of breath. (chronic) Treatments tried: rubbing alcohol. The treatment provided no relief.  Knee Pain  Incident onset: right knee pain since dx with cirrhosis of the liver. The injury mechanism was a fall (2 times a couple of months ago). The pain is present in the right knee. The quality of the pain is described as aching (weak). The pain is at a severity of 7/10. The pain  is moderate. The pain has been Fluctuating since onset. Associated symptoms include an inability to bear weight, a loss of motion, muscle weakness and tingling. The symptoms are aggravated by movement and weight bearing. He has tried elevation for the symptoms. The treatment provided no relief.  Unfortunately he does not know the dentist name so I could call and let them know about the reaction and changing the medication. No problems updated.  Comprehensive ROS Pertinent positive and negative noted in HPI   Allergies  Allergen Reactions   Nsaids     Patient denies any allergy to NSAIDs (particular Advil) He takes this medication frequently without any adverse side effects     Past Medical History:  Diagnosis Date   Autoimmune hepatitis (HCC)    Cirrhosis of liver (HCC)    Hypertension    Thrombocytopenia (HCC)     Current Outpatient Medications on File Prior to Visit  Medication Sig Dispense Refill   amoxicillin (AMOXIL) 875 MG tablet Take 1 tablet (875 mg total) by mouth 2 (two) times daily until gone, 20 tablet 0   azaTHIOprine (IMURAN) 50 MG tablet Take 1 tablet (50 mg total) by mouth daily. 90 tablet 1   Blood Pressure Monitoring (BLOOD PRESSURE KIT) DEVI Use to measure blood pressure 1 each 0   carvedilol (COREG) 3.125 MG tablet Take 1 tablet (3.125 mg total) by mouth in the morning and 1 tablet (3.125 mg total) in the evening. Take with meals. 180 tablet 3  cyclobenzaprine (FLEXERIL) 10 MG tablet Take 1 tablet (10 mg total) by mouth at bedtime as needed for muscle spasms. (Patient not taking: Reported on 09/22/2023) 30 tablet 1   predniSONE (DELTASONE) 10 MG tablet Take 1 tablet (10 mg total) by mouth daily with breakfast. 30 tablet 1   spironolactone (ALDACTONE) 100 MG tablet Take 1 tablet (100 mg total) by mouth daily. (Patient taking differently: Take 50 mg by mouth daily.) 90 tablet 11   No current facility-administered medications on file prior to visit.   Health  Maintenance  Topic Date Due   Zoster (Shingles) Vaccine (1 of 2) Never done   Stool Blood Test  10/20/2023   DTaP/Tdap/Td vaccine (1 - Tdap) 06/14/2024*   Flu Shot  02/04/2024   Pneumococcal Vaccination  Completed   Hepatitis C Screening  Completed   HIV Screening  Completed   HPV Vaccine  Aged Out   Meningitis B Vaccine  Aged Out   Colon Cancer Screening  Discontinued   COVID-19 Vaccine  Discontinued  *Topic was postponed. The date shown is not the original due date.    Objective:   Vitals:   10/20/23 1336  BP: 129/86  Pulse: 69  Resp: 16  SpO2: 100%  Weight: 190 lb 6.4 oz (86.4 kg)  Height: 6\' 2"  (1.88 m)   BP Readings from Last 3 Encounters:  10/20/23 129/86  09/22/23 122/80  08/11/23 134/83   Physical Exam Vitals reviewed.  Constitutional:      Appearance: Normal appearance. He is normal weight.  HENT:     Head: Normocephalic.     Right Ear: Tympanic membrane and external ear normal.     Left Ear: Tympanic membrane and external ear normal.     Nose: Nose normal.  Eyes:     Extraocular Movements: Extraocular movements intact.  Cardiovascular:     Rate and Rhythm: Normal rate and regular rhythm.  Pulmonary:     Effort: Pulmonary effort is normal.     Breath sounds: Normal breath sounds.  Abdominal:     General: Bowel sounds are normal. There is distension.     Palpations: Abdomen is soft.  Musculoskeletal:        General: Normal range of motion.     Cervical back: Normal range of motion.  Skin:    Findings: Erythema and rash present.  Neurological:     Mental Status: He is oriented to person, place, and time.  Psychiatric:        Mood and Affect: Mood normal.        Behavior: Behavior normal.        Thought Content: Thought content normal.        Judgment: Judgment normal.     Assessment & Plan  Harun was seen today for rash and knee pain.  Diagnoses and all orders for this visit:  Chronic pain of right knee  creatinine clearance we will send in  a short course of pain medication until follow-up with PCP scheduled next month -     traMADol (ULTRAM) 50 MG tablet; Take 1 tablet (50 mg total) by mouth every 8 (eight) hours as needed for up to 5 days.  Rash and nonspecific skin eruption 2/2 Allergic dermatitis due to other chemical product Patient has dentist information at home and will call them and let them know about the rash that he has. Triamcinolone  0.1% Appointment  Patient have been counseled extensively about nutrition and exercise. Other issues discussed during this visit  include: low cholesterol diet, weight control and daily exercise, foot care, annual eye examinations at Ophthalmology, importance of adherence with medications and regular follow-up. We also discussed long term complications of uncontrolled diabetes and hypertension.   Keep scheduled appointment with PCP  The patient was given clear instructions to go to ER or return to medical center if symptoms don't improve, worsen or new problems develop. The patient verbalized understanding. The patient was told to call to get lab results if they haven't heard anything in the next week.   This note has been created with Education officer, environmental. Any transcriptional errors are unintentional.   Marius Siemens, NP 10/20/2023, 2:23 PM

## 2023-10-20 NOTE — Patient Instructions (Signed)
 Conservative treatment luke warm baths, Aveeno oatmeal bath or emollients. Medications can be discussed at this visit- antihistamine or pruritic medications. Contact Dermatitis Dermatitis is when your skin becomes red, sore, and swollen.  Contact dermatitis happens when your body reacts to something that touches the skin. There are 2 types: Irritant contact dermatitis. This is when something bothers your skin, like soap. Allergic contact dermatitis. This is when your skin touches something you are allergic to, like poison ivy. What are the causes? Irritant contact dermatitis may be caused by: Makeup. Soaps. Detergents. Bleaches. Acids. Metals, like nickel. Allergic contact dermatitis may be caused by: Plants. Chemicals. Jewelry. Latex. Medicines. Preservatives. These are things added to products to help them last longer. There may be some in your clothes. What increases the risk? Having a job where you have to be near things that bother your skin. Having asthma or eczema. What are the signs or symptoms?  Dry or flaky skin. Redness. Cracks. Itching. Moderate symptoms of this condition include: Pain or a burning feeling. Blisters. Blood or clear fluid coming from cracks in your skin. Swelling. This may be on your eyelids, mouth, or genitals. How is this treated? Your doctor will find out what is making your skin react. Then, you can protect your skin. You may need to use: Steroid creams, ointments, or medicines. Antibiotics or other ointments, if you have a skin infection. Lotion or medicines to help with itching. A bandage. Follow these instructions at home: Skin care Put moisturizer on your skin when it needs it. Put cool, wet cloths on your skin (cool compresses). Put a baking soda paste on your skin. Stir water into baking soda until it looks like a paste. Do not scratch your skin. Try not to have things rub up against your skin. Avoid tight clothing. Avoid using  soaps, perfumes, and dyes. Check your skin every day for signs of infection. Check for: More redness, swelling, or pain. More fluid or blood. Warmth. Pus or a bad smell. Medicines Take or apply over-the-counter and prescription medicines only as told by your doctor. If you were prescribed antibiotics, take or apply them as told by your doctor. Do not stop using them even if you start to feel better. Bathing Take a bath with: Epsom salts. Baking soda. Colloidal oatmeal. Bathe less often. Bathe in warm water. Try not to use hot water. Bandage care If you were given a bandage, change it as told by your doctor. Wash your hands with soap and water for at least 20 seconds before and after you change your bandage. If you cannot use soap and water, use hand sanitizer. General instructions Avoid the things that caused your reaction. If you don't know what caused it, keep a journal. Write down: What you eat. What skin products you use. What you drink. What you wear. Contact a doctor if: You do not get better with treatment. You get worse. You have signs of infection. You have a fever. You have new symptoms. Your bone or joint near the area hurts after the skin has healed. Get help right away if: You see red streaks coming from the area. The area turns darker. You have trouble breathing. This information is not intended to replace advice given to you by your health care provider. Make sure you discuss any questions you have with your health care provider. Document Revised: 12/26/2021 Document Reviewed: 12/26/2021 Elsevier Patient Education  2024 [employer].

## 2023-10-21 ENCOUNTER — Other Ambulatory Visit: Payer: Self-pay

## 2023-10-21 ENCOUNTER — Other Ambulatory Visit (INDEPENDENT_AMBULATORY_CARE_PROVIDER_SITE_OTHER): Payer: Self-pay | Admitting: Primary Care

## 2023-10-21 DIAGNOSIS — L235 Allergic contact dermatitis due to other chemical products: Secondary | ICD-10-CM

## 2023-10-21 DIAGNOSIS — R21 Rash and other nonspecific skin eruption: Secondary | ICD-10-CM

## 2023-10-21 MED ORDER — TRIAMCINOLONE ACETONIDE 0.1 % EX CREA
1.0000 | TOPICAL_CREAM | Freq: Two times a day (BID) | CUTANEOUS | 0 refills | Status: DC
  Filled 2023-10-21: qty 453, 28d supply, fill #0

## 2023-10-27 ENCOUNTER — Other Ambulatory Visit

## 2023-10-27 ENCOUNTER — Other Ambulatory Visit: Payer: Self-pay

## 2023-10-27 DIAGNOSIS — K754 Autoimmune hepatitis: Secondary | ICD-10-CM | POA: Diagnosis not present

## 2023-10-28 LAB — IGG: IgG (Immunoglobin G), Serum: 2270 mg/dL — ABNORMAL HIGH (ref 600–1540)

## 2023-11-03 DIAGNOSIS — K754 Autoimmune hepatitis: Secondary | ICD-10-CM | POA: Diagnosis not present

## 2023-11-03 DIAGNOSIS — R188 Other ascites: Secondary | ICD-10-CM | POA: Diagnosis not present

## 2023-11-03 DIAGNOSIS — K7460 Unspecified cirrhosis of liver: Secondary | ICD-10-CM | POA: Diagnosis not present

## 2023-11-03 DIAGNOSIS — I8500 Esophageal varices without bleeding: Secondary | ICD-10-CM | POA: Diagnosis not present

## 2023-11-04 NOTE — Telephone Encounter (Signed)
 Spoke with pt and let him know we are waiting to hear back from [facility] regarding med dose.

## 2023-11-08 ENCOUNTER — Ambulatory Visit: Payer: Medicaid Other | Attending: Family Medicine | Admitting: Family Medicine

## 2023-11-08 ENCOUNTER — Encounter: Payer: Self-pay | Admitting: Family Medicine

## 2023-11-08 VITALS — BP 127/83 | HR 67 | Ht 74.0 in | Wt 190.0 lb

## 2023-11-08 DIAGNOSIS — M25572 Pain in left ankle and joints of left foot: Secondary | ICD-10-CM | POA: Diagnosis not present

## 2023-11-08 DIAGNOSIS — G8929 Other chronic pain: Secondary | ICD-10-CM

## 2023-11-08 DIAGNOSIS — K754 Autoimmune hepatitis: Secondary | ICD-10-CM

## 2023-11-08 DIAGNOSIS — M25571 Pain in right ankle and joints of right foot: Secondary | ICD-10-CM | POA: Diagnosis not present

## 2023-11-08 DIAGNOSIS — Z131 Encounter for screening for diabetes mellitus: Secondary | ICD-10-CM | POA: Diagnosis not present

## 2023-11-08 DIAGNOSIS — R202 Paresthesia of skin: Secondary | ICD-10-CM | POA: Diagnosis not present

## 2023-11-08 DIAGNOSIS — L603 Nail dystrophy: Secondary | ICD-10-CM | POA: Diagnosis not present

## 2023-11-08 LAB — POCT GLYCOSYLATED HEMOGLOBIN (HGB A1C): Hemoglobin A1C: 5.6 % (ref 4.0–5.6)

## 2023-11-08 MED ORDER — DICLOFENAC SODIUM 1 % EX GEL: 4.0000 g | Freq: Four times a day (QID) | CUTANEOUS | 1 refills | Status: DC

## 2023-11-08 NOTE — Patient Instructions (Signed)
Low-Sodium Eating Plan Salt (sodium) helps you keep a healthy balance of fluids in your body. Too much sodium can raise your blood pressure. It can also cause fluid and waste to be held in your body. Your health care provider or dietitian may recommend a low-sodium eating plan if you have high blood pressure (hypertension), kidney disease, liver disease, or heart failure. Eating less sodium can help lower your blood pressure and reduce swelling. It can also protect your heart, liver, and kidneys. What are tips for following this plan? Reading food labels  Check food labels for the amount of sodium per serving. If you eat more than one serving, you must multiply the listed amount by the number of servings. Choose foods with less than 140 milligrams (mg) of sodium per serving. Avoid foods with 300 mg of sodium or more per serving. Always check how much sodium is in a product, even if the label says "unsalted" or "no salt added." Shopping  Buy products labeled as "low-sodium" or "no salt added." Buy fresh foods. Avoid canned foods and pre-made or frozen meals. Avoid canned, cured, or processed meats. Buy breads that have less than 80 mg of sodium per slice. Cooking  Eat more home-cooked food. Try to eat less restaurant, buffet, and fast food. Try not to add salt when you cook. Use salt-free seasonings or herbs instead of table salt or sea salt. Check with your provider or pharmacist before using salt substitutes. Cook with plant-based oils, such as canola, sunflower, or olive oil. Meal planning When eating at a restaurant, ask if your food can be made with less salt or no salt. Avoid dishes labeled as brined, pickled, cured, or smoked. Avoid dishes made with soy sauce, miso, or teriyaki sauce. Avoid foods that have monosodium glutamate (MSG) in them. MSG may be added to some restaurant food, sauces, soups, bouillon, and canned foods. Make meals that can be grilled, baked, poached, roasted, or  steamed. These are often made with less sodium. General information Try to limit your sodium intake to 1,500-2,300 mg each day, or the amount told by your provider. What foods should I eat? Fruits Fresh, frozen, or canned fruit. Fruit juice. Vegetables Fresh or frozen vegetables. "No salt added" canned vegetables. "No salt added" tomato sauce and paste. Low-sodium or reduced-sodium tomato and vegetable juice. Grains Low-sodium cereals, such as oats, puffed wheat and rice, and shredded wheat. Low-sodium crackers. Unsalted rice. Unsalted pasta. Low-sodium bread. Whole grain breads and whole grain pasta. Meats and other proteins Fresh or frozen meat, poultry, seafood, and fish. These should have no added salt. Low-sodium canned tuna and salmon. Unsalted nuts. Dried peas, beans, and lentils without added salt. Unsalted canned beans. Eggs. Unsalted nut butters. Dairy Milk. Soy milk. Cheese that is naturally low in sodium, such as ricotta cheese, fresh mozzarella, or Swiss cheese. Low-sodium or reduced-sodium cheese. Cream cheese. Yogurt. Seasonings and condiments Fresh and dried herbs and spices. Salt-free seasonings. Low-sodium mustard and ketchup. Sodium-free salad dressing. Sodium-free light mayonnaise. Fresh or refrigerated horseradish. Lemon juice. Vinegar. Other foods Homemade, reduced-sodium, or low-sodium soups. Unsalted popcorn and pretzels. Low-salt or salt-free chips. The items listed above may not be all the foods and drinks you can have. Talk to a dietitian to learn more. What foods should I avoid? Vegetables Sauerkraut, pickled vegetables, and relishes. Olives. [place] fries. Onion rings. Regular canned vegetables, except low-sodium or reduced-sodium items. Regular canned tomato sauce and paste. Regular tomato and vegetable juice. Frozen vegetables in sauces. Grains Instant  hot cereals. Bread stuffing, pancake, and biscuit mixes. Croutons. Seasoned rice or pasta mixes. Noodle soup  cups. Boxed or frozen macaroni and cheese. Regular salted crackers. Self-rising flour. Meats and other proteins Meat or fish that is salted, canned, smoked, spiced, or pickled. Precooked or cured meat, such as sausages or meat loaves. Tomasa Blase. Ham. Pepperoni. Hot dogs. Corned beef. Chipped beef. Salt pork. Jerky. Pickled herring, anchovies, and sardines. Regular canned tuna. Salted nuts. Dairy Processed cheese and cheese spreads. Hard cheeses. Cheese curds. Blue cheese. Feta cheese. String cheese. Regular cottage cheese. Buttermilk. Canned milk. Fats and oils Salted butter. Regular margarine. Ghee. Bacon fat. Seasonings and condiments Onion salt, garlic salt, seasoned salt, table salt, and sea salt. Canned and packaged gravies. Worcestershire sauce. Tartar sauce. Barbecue sauce. Teriyaki sauce. Soy sauce, including reduced-sodium soy sauce. Steak sauce. Fish sauce. Oyster sauce. Cocktail sauce. Horseradish that you find on the shelf. Regular ketchup and mustard. Meat flavorings and tenderizers. Bouillon cubes. Hot sauce. Pre-made or packaged marinades. Pre-made or packaged taco seasonings. Relishes. Regular salad dressings. Salsa. Other foods Salted popcorn and pretzels. Corn chips and puffs. Potato and tortilla chips. Canned or dried soups. Pizza. Frozen entrees and pot pies. The items listed above may not be all the foods and drinks you should avoid. Talk to a dietitian to learn more. This information is not intended to replace advice given to you by your health care provider. Make sure you discuss any questions you have with your health care provider. Document Revised: 07/09/2022 Document Reviewed: 07/09/2022 Elsevier Patient Education  2024 [employer].

## 2023-11-08 NOTE — Progress Notes (Signed)
 Subjective:  Patient ID: Timothy [name], male    DOB: 10/26/1960  Age: 63 y.o. MRN: [id number]  CC: Medical Management of Chronic Issues (Right foot pain)     Discussed the use of AI scribe software for clinical note transcription with the patient, who gave verbal consent to proceed.  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  He experiences sharp pain and numbness in his feet, particularly on the anterior aspect of his bilateral lower legs and anterior aspect of both ankles occurring approximately twice a week and often waking him at night. Each episode lasts about 20 to 30 minutes and began around April of the previous year.  He has prediabetes diagnosed in 2021 with an A1c of 5.9 but is not on any specific medication for diabetes.  He is under the care of a gastroenterologist for cirrhosis management and takes azathioprine  with spironolactone .  He is on Coreg  for hypertension.  He denies smoking and recently had a rash reaction to amoxicillin  scribed by his dentist, which he has discontinued.    Past Medical History:  Diagnosis Date   Autoimmune hepatitis (HCC)    Cirrhosis of liver (HCC)    Hypertension    Thrombocytopenia (HCC)     Past Surgical History:  Procedure Laterality Date   ESOPHAGEAL BANDING  10/20/2022   Procedure: ESOPHAGEAL BANDING;  Surgeon: Nannette Babe, MD;  Location: [facility] ENDOSCOPY;  Service: Gastroenterology;;   ESOPHAGEAL BANDING  11/11/2022   Procedure: ESOPHAGEAL BANDING;  Surgeon: Nannette Babe, MD;  Location: [facility] ENDOSCOPY;  Service: Gastroenterology;;   ESOPHAGEAL BANDING N/A 12/07/2022   Procedure: ESOPHAGEAL BANDING;  Surgeon: Tobin Forts, MD;  Location: [facility] ENDOSCOPY;  Service: Gastroenterology;  Laterality: N/A;   ESOPHAGOGASTRODUODENOSCOPY N/A  10/20/2022   Procedure: ESOPHAGOGASTRODUODENOSCOPY (EGD);  Surgeon: Nannette Babe, MD;  Location: [facility] ENDOSCOPY;  Service: Gastroenterology;  Laterality: N/A;  per ICU   ESOPHAGOGASTRODUODENOSCOPY (EGD) WITH PROPOFOL  N/A 11/11/2022   Procedure: ESOPHAGOGASTRODUODENOSCOPY (EGD) WITH PROPOFOL ;  Surgeon: Nannette Babe, MD;  Location: [facility] ENDOSCOPY;  Service: Gastroenterology;  Laterality: N/A;   ESOPHAGOGASTRODUODENOSCOPY (EGD) WITH PROPOFOL  N/A 12/07/2022   Procedure: ESOPHAGOGASTRODUODENOSCOPY (EGD) WITH PROPOFOL ;  Surgeon: Tobin Forts, MD;  Location: [facility] ENDOSCOPY;  Service: Gastroenterology;  Laterality: N/A;   ESOPHAGOGASTRODUODENOSCOPY (EGD) WITH PROPOFOL  N/A 03/01/2023   Procedure: ESOPHAGOGASTRODUODENOSCOPY (EGD) WITH PROPOFOL ;  Surgeon: Nannette Babe, MD;  Location: [facility] ENDOSCOPY;  Service: Gastroenterology;  Laterality: N/A;   teeth      Family History  Problem Relation Age of Onset   Hypertension Sister     Social History   Socioeconomic History   Marital status: Single    Spouse name: Not on file   Number of children: 0   Years of education: Not on file   Highest education level: Not on file  Occupational History   Occupation: retired  Tobacco Use   Smoking status: Never   Smokeless tobacco: Never  Vaping Use   Vaping status: Never Used  Substance and Sexual Activity   Alcohol use: No    Alcohol/week: 0.0 standard drinks of alcohol   Drug use: No   Sexual activity: Yes  Other Topics Concern   Not on file  Social History Narrative   Not on file   Social Drivers of Health  Financial Resource Strain: Not on file  Food Insecurity: Food Insecurity Present (06/15/2023)   Hunger Vital Sign    Worried About Running Out of Food in the Last Year: Never true    Ran Out of Food in the Last Year: Sometimes true  Transportation Needs: No Transportation Needs (06/15/2023)   PRAPARE - Administrator, Civil Service (Medical): No    Lack of Transportation  (Non-Medical): No  Physical Activity: Not on file  Stress: Not on file  Social Connections: Unknown (06/15/2023)   Social Connection and Isolation Panel [NHANES]    Frequency of Communication with Friends and Family: Three times a week    Frequency of Social Gatherings with Friends and Family: Three times a week    Attends Religious Services: Not on file    Active Member of Clubs or Organizations: Not on file    Attends Banker Meetings: Not on file    Marital Status: Not on file    Allergies  Allergen Reactions   Nsaids     Patient denies any allergy to NSAIDs (particular Advil ) He takes this medication frequently without any adverse side effects    Amoxicillin  Rash    Outpatient Medications Prior to Visit  Medication Sig Dispense Refill   azaTHIOprine  (IMURAN ) 50 MG tablet Take 1 tablet (50 mg total) by mouth daily. 90 tablet 1   Blood Pressure Monitoring (BLOOD PRESSURE KIT) DEVI Use to measure blood pressure 1 each 0   carvedilol  (COREG ) 3.125 MG tablet Take 1 tablet (3.125 mg total) by mouth in the morning and 1 tablet (3.125 mg total) in the evening. Take with meals. 180 tablet 3   predniSONE  (DELTASONE ) 10 MG tablet Take 1 tablet (10 mg total) by mouth daily with breakfast. 30 tablet 1   spironolactone  (ALDACTONE ) 100 MG tablet Take 1 tablet (100 mg total) by mouth daily. (Patient taking differently: Take 50 mg by mouth daily.) 90 tablet 11   triamcinolone  cream (KENALOG ) 0.1 % Apply 1 Application topically 2 (two) times daily. 453 g 0   cyclobenzaprine  (FLEXERIL ) 10 MG tablet Take 1 tablet (10 mg total) by mouth at bedtime as needed for muscle spasms. (Patient not taking: Reported on 11/08/2023) 30 tablet 1   amoxicillin  (AMOXIL ) 875 MG tablet Take 1 tablet (875 mg total) by mouth 2 (two) times daily until gone, (Patient not taking: Reported on 11/08/2023) 20 tablet 0   No facility-administered medications prior to visit.     ROS Review of Systems   Constitutional:  Negative for activity change and appetite change.  HENT:  Negative for sinus pressure and sore throat.   Respiratory:  Negative for chest tightness, shortness of breath and wheezing.   Cardiovascular:  Negative for chest pain and palpitations.  Gastrointestinal:  Negative for abdominal distention, abdominal pain and constipation.  Genitourinary: Negative.   Musculoskeletal:        See HPI  Neurological:  Positive for numbness.  Psychiatric/Behavioral:  Negative for behavioral problems and dysphoric mood.     Objective:  BP 127/83   Pulse 67   Ht 6\' 2"  (1.88 m)   Wt 190 lb (86.2 kg)   SpO2 100%   BMI 24.39 kg/m      11/08/2023   11:26 AM 10/20/2023    1:36 PM 09/22/2023    9:39 AM  BP/Weight  Systolic BP 127 129 122  Diastolic BP 83 86 80  Wt. (Lbs) 190 190.4 191  BMI 24.39 kg/m2 24.45 kg/m2  24.52 kg/m2      Physical Exam Constitutional:      Appearance: He is well-developed.  Cardiovascular:     Rate and Rhythm: Normal rate.     Heart sounds: Normal heart sounds. No murmur heard. Pulmonary:     Effort: Pulmonary effort is normal.     Breath sounds: Normal breath sounds. No wheezing or rales.  Chest:     Chest wall: No tenderness.  Abdominal:     General: Bowel sounds are normal. There is no distension.     Palpations: Abdomen is soft. There is no mass.     Tenderness: There is no abdominal tenderness.  Musculoskeletal:        General: Normal range of motion.     Right lower leg: No edema.     Left lower leg: No edema.     Comments: Normal appearance of both ankles with no tenderness on palpation or range of motion.  Neurological:     Mental Status: He is alert and oriented to person, place, and time.  Psychiatric:        Mood and Affect: Mood normal.        Latest Ref Rng & Units 07/15/2023    9:06 AM 06/15/2023    2:44 PM 03/16/2023    2:11 PM  CMP  Glucose 70 - 99 mg/dL 82  89  87   BUN 6 - 23 mg/dL 24  18  18    Creatinine 0.40 -  1.50 mg/dL 5.62  1.30  8.65   Sodium 135 - 145 mEq/L 141  142  141   Potassium 3.5 - 5.1 mEq/L 4.2  4.8  5.0   Chloride 96 - 112 mEq/L 108  107  107   CO2 19 - 32 mEq/L 26  24  24    Calcium 8.4 - 10.5 mg/dL 9.5  9.7  9.4   Total Protein 6.0 - 8.3 g/dL 7.6  7.3  7.3   Total Bilirubin 0.2 - 1.2 mg/dL 1.2  1.0  0.8   Alkaline Phos 39 - 117 U/L 83  96  95   AST 0 - 37 U/L 67  72  80   ALT 0 - 53 U/L 43  48  46     Lipid Panel     Component Value Date/Time   CHOL 159 01/27/2022 0912   TRIG 50 01/27/2022 0912   HDL 62 01/27/2022 0912   CHOLHDL 2.6 01/27/2022 0912   CHOLHDL 2.8 03/29/2015 1111   VLDL 14 03/29/2015 1111   LDLCALC 86 01/27/2022 0912    CBC    Component Value Date/Time   WBC 2.9 (L) 07/15/2023 0906   RBC 4.09 (L) 07/15/2023 0906   HGB 12.7 (L) 07/15/2023 0906   HGB 11.7 (L) 03/16/2023 1411   HGB 14.5 01/27/2016 1005   HCT 38.4 (L) 07/15/2023 0906   HCT 36.4 (L) 03/16/2023 1411   HCT 42.1 01/27/2016 1005   PLT 62.0 (L) 07/15/2023 0906   PLT 95 (LL) 03/16/2023 1411   MCV 93.9 07/15/2023 0906   MCV 81 03/16/2023 1411   MCV 84.7 01/27/2016 1005   MCH 26.2 (L) 03/16/2023 1411   MCH 30.2 10/23/2022 0113   MCHC 33.0 07/15/2023 0906   RDW 16.9 (H) 07/15/2023 0906   RDW 21.9 (H) 03/16/2023 1411   RDW 15.6 (H) 01/27/2016 1005   LYMPHSABS 0.7 07/15/2023 0906   LYMPHSABS 0.9 03/16/2023 1411   LYMPHSABS 1.8 01/27/2016 1005   MONOABS 0.4 07/15/2023  0906   MONOABS 0.5 01/27/2016 1005   EOSABS 0.2 07/15/2023 0906   EOSABS 0.2 03/16/2023 1411   BASOSABS 0.0 07/15/2023 0906   BASOSABS 0.0 03/16/2023 1411   BASOSABS 0.0 01/27/2016 1005    Lab Results  Component Value Date   HGBA1C 5.6 11/08/2023       Assessment & Plan Bilateral ankle pain Intermittent bilateral ankle pain for one year. Differential includes Gout  and osteoarthritis.  - Order ankle x-ray for osteoarthritis evaluation. - Order blood work for gout screening. - Prescribe Voltaren gel for  pain.  Paresthesia - Diabetes excluded - Will check vitamin B12 level  Dystrophic nail - Refer to podiatrist for toenail care.  Pre-diabetes A1c of 5.6 indicates resolution of pre-diabetes since 2021.  Cirrhosis of liver Autoimmune -Continue medications-azathioprine , spironolactone  Managed by gastroenterologist. Discussed potential azathioprine  dosage increase.  Hypertension - Controlled - Continue antihypertensive  General Health Maintenance Requested low sodium diet information. - Provide printed low sodium diet information.      Meds ordered this encounter  Medications   diclofenac Sodium (VOLTAREN) 1 % GEL    Sig: Apply 4 g topically 4 (four) times daily.    Dispense:  100 g    Refill:  1    Follow-up: Return in about 6 months (around 05/10/2024) for Chronic medical conditions.       Joaquin Mulberry, MD, FAAFP. [facility] [place], [place] [phone]   11/08/2023, 12:24 PM

## 2023-11-09 ENCOUNTER — Encounter: Payer: Self-pay | Admitting: Family Medicine

## 2023-11-10 ENCOUNTER — Other Ambulatory Visit: Payer: Self-pay

## 2023-11-10 ENCOUNTER — Ambulatory Visit
Admission: RE | Admit: 2023-11-10 | Discharge: 2023-11-10 | Disposition: A | Discharge status: Home / Self Care | Source: Ambulatory Visit | Attending: Family Medicine | Admitting: Family Medicine

## 2023-11-10 DIAGNOSIS — M25571 Pain in right ankle and joints of right foot: Secondary | ICD-10-CM | POA: Diagnosis not present

## 2023-11-10 DIAGNOSIS — M25572 Pain in left ankle and joints of left foot: Secondary | ICD-10-CM | POA: Diagnosis not present

## 2023-11-10 DIAGNOSIS — G8929 Other chronic pain: Secondary | ICD-10-CM

## 2023-11-10 DIAGNOSIS — M19071 Primary osteoarthritis, right ankle and foot: Secondary | ICD-10-CM | POA: Diagnosis not present

## 2023-11-10 MED ORDER — AZATHIOPRINE 50 MG PO TABS
100.0000 mg | ORAL_TABLET | Freq: Every day | ORAL | 11 refills | Status: AC
  Filled 2023-11-10: qty 60, 30d supply, fill #0
  Filled 2024-01-04: qty 60, 30d supply, fill #1
  Filled 2024-02-04: qty 60, 30d supply, fill #2
  Filled 2024-03-08: qty 60, 30d supply, fill #3
  Filled 2024-04-07: qty 60, 30d supply, fill #4
  Filled 2024-05-10: qty 60, 30d supply, fill #5
  Filled 2024-06-08: qty 60, 30d supply, fill #6
  Filled 2024-07-10 (×2): qty 60, 30d supply, fill #7

## 2023-11-12 ENCOUNTER — Telehealth: Payer: Self-pay | Admitting: Family Medicine

## 2023-11-12 ENCOUNTER — Encounter: Payer: Self-pay | Admitting: Family Medicine

## 2023-11-12 ENCOUNTER — Other Ambulatory Visit: Payer: Self-pay

## 2023-11-12 NOTE — Telephone Encounter (Signed)
 Duplicate message, patient has sent MyChart message to patient.

## 2023-11-12 NOTE — Telephone Encounter (Signed)
 Copied from CRM [id number]. Topic: Clinical - Medical Advice >> Nov 12, 2023 12:22 PM Turkey B wrote:  Reason for CRM: pt called in states spoke with Dr Newlin about taking B12 vitamins for his legs. He wants to know is he supposed to go ahead and that the vitamins

## 2023-11-16 ENCOUNTER — Ambulatory Visit: Payer: Self-pay | Admitting: *Deleted

## 2023-11-17 ENCOUNTER — Ambulatory Visit: Payer: Self-pay | Admitting: Family Medicine

## 2023-11-17 DIAGNOSIS — Z1159 Encounter for screening for other viral diseases: Secondary | ICD-10-CM

## 2023-11-17 LAB — CBC WITH DIFFERENTIAL/PLATELET
Basophils Absolute: 0 10*3/uL (ref 0.0–0.2)
Basos: 1 %
EOS (ABSOLUTE): 0.1 10*3/uL (ref 0.0–0.4)
Eos: 3 %
Hematocrit: 40.1 % (ref 37.5–51.0)
Hemoglobin: 13 g/dL (ref 13.0–17.7)
Immature Grans (Abs): 0 10*3/uL (ref 0.0–0.1)
Immature Granulocytes: 1 %
Lymphocytes Absolute: 0.7 10*3/uL (ref 0.7–3.1)
Lymphs: 19 %
MCH: 30.3 pg (ref 26.6–33.0)
MCHC: 32.4 g/dL (ref 31.5–35.7)
MCV: 94 fL (ref 79–97)
Monocytes Absolute: 0.5 10*3/uL (ref 0.1–0.9)
Monocytes: 14 %
Neutrophils Absolute: 2.3 10*3/uL (ref 1.4–7.0)
Neutrophils: 62 %
Platelets: 64 10*3/uL — CL (ref 150–450)
RBC: 4.29 x10E6/uL (ref 4.14–5.80)
RDW: 15.2 % (ref 11.6–15.4)
WBC: 3.7 10*3/uL (ref 3.4–10.8)

## 2023-11-17 LAB — CMP14+EGFR
ALT: 80 IU/L — ABNORMAL HIGH (ref 0–44)
AST: 72 IU/L — ABNORMAL HIGH (ref 0–40)
Albumin: 3.6 g/dL — ABNORMAL LOW (ref 3.9–4.9)
Alkaline Phosphatase: 103 IU/L (ref 44–121)
BUN/Creatinine Ratio: 20 (ref 10–24)
BUN: 29 mg/dL — ABNORMAL HIGH (ref 8–27)
Bilirubin Total: 1 mg/dL (ref 0.0–1.2)
CO2: 22 mmol/L (ref 20–29)
Calcium: 9.7 mg/dL (ref 8.6–10.2)
Chloride: 104 mmol/L (ref 96–106)
Creatinine, Ser: 1.43 mg/dL — ABNORMAL HIGH (ref 0.76–1.27)
Globulin, Total: 3.4 g/dL (ref 1.5–4.5)
Glucose: 137 mg/dL — ABNORMAL HIGH (ref 70–99)
Potassium: 5.4 mmol/L — ABNORMAL HIGH (ref 3.5–5.2)
Sodium: 136 mmol/L (ref 134–144)
Total Protein: 7 g/dL (ref 6.0–8.5)
eGFR: 55 mL/min/{1.73_m2} — ABNORMAL LOW (ref >59)

## 2023-11-17 LAB — VITAMIN B12

## 2023-11-17 LAB — URIC ACID: Uric Acid: 5.5 mg/dL (ref 3.8–8.4)

## 2023-11-19 ENCOUNTER — Other Ambulatory Visit: Payer: Self-pay

## 2023-11-19 ENCOUNTER — Ambulatory Visit: Attending: Nurse Practitioner

## 2023-11-19 DIAGNOSIS — Z1159 Encounter for screening for other viral diseases: Secondary | ICD-10-CM | POA: Diagnosis not present

## 2023-11-20 LAB — VITAMIN B12: Vitamin B-12: 981 pg/mL (ref 232–1245)

## 2023-11-22 ENCOUNTER — Ambulatory Visit (INDEPENDENT_AMBULATORY_CARE_PROVIDER_SITE_OTHER): Admitting: Podiatrist

## 2023-11-22 ENCOUNTER — Ambulatory Visit: Payer: Self-pay | Admitting: Family Medicine

## 2023-11-22 ENCOUNTER — Encounter: Payer: Self-pay | Admitting: Podiatrist

## 2023-11-22 DIAGNOSIS — M79676 Pain in unspecified toe(s): Secondary | ICD-10-CM

## 2023-11-22 DIAGNOSIS — B351 Tinea unguium: Secondary | ICD-10-CM | POA: Diagnosis not present

## 2023-11-22 NOTE — Progress Notes (Unsigned)
 Chief Complaint  Patient presents with   Nail Problem    Pt presents for routine foot care. Pt is not diabetic.     HPI: Patient is 63 y.o. Timothy [name] who presents today for nail care.  Relates the toenails are long and painful in shoegear  Patient Active Problem List   Diagnosis Date Noted   History of hepatitis C 01/05/2023   Esophageal varices with bleeding in diseases classified elsewhere (HCC) 10/20/2022   Portal hypertension with esophageal varices (HCC) 10/20/2022   Multiple benign nevi 01/27/2022   Thrombocytopenia (HCC) 10/24/2015   Liver cirrhosis (HCC) 10/24/2015   Autoimmune hepatitis (HCC) 03/28/2013   Essential hypertension 11/15/2012    Current Outpatient Medications on File Prior to Visit  Medication Sig Dispense Refill   azaTHIOprine  (IMURAN ) 50 MG tablet Take 2 tablets (100 mg total) by mouth daily. Please note increased dose. 60 tablet 11   Blood Pressure Monitoring (BLOOD PRESSURE KIT) DEVI Use to measure blood pressure 1 each 0   carvedilol  (COREG ) 3.125 MG tablet Take 1 tablet (3.125 mg total) by mouth in the morning and 1 tablet (3.125 mg total) in the evening. Take with meals. 180 tablet 3   cyclobenzaprine  (FLEXERIL ) 10 MG tablet Take 1 tablet (10 mg total) by mouth at bedtime as needed for muscle spasms. (Patient not taking: Reported on 11/08/2023) 30 tablet 1   diclofenac  Sodium (VOLTAREN ) 1 % GEL Apply 4 g topically 4 (four) times daily. 100 g 1   predniSONE  (DELTASONE ) 10 MG tablet Take 1 tablet (10 mg total) by mouth daily with breakfast. 30 tablet 1   spironolactone  (ALDACTONE ) 100 MG tablet Take 1 tablet (100 mg total) by mouth daily. (Patient taking differently: Take 50 mg by mouth daily.) 90 tablet 11   triamcinolone  cream (KENALOG ) 0.1 % Apply 1 Application topically 2 (two) times daily. 453 g 0   No current facility-administered medications on file prior to visit.    Allergies  Allergen Reactions   Nsaids     Patient denies any allergy to NSAIDs  (particular Advil ) He takes this medication frequently without any adverse side effects    Amoxicillin  Rash    Review of Systems No fevers, chills, nausea, muscle aches, no difficulty breathing, no calf pain, no chest pain or shortness of breath.   Physical Exam  GENERAL APPEARANCE: Alert, conversant. Appropriately groomed. No acute distress.   VASCULAR: Pedal pulses palpable 2/4 DP and 1.4 PT bilateral.  Capillary refill time is immediate to all digits,  Proximal to distal cooling is warm to warm.  Digital perfusion adequate.   NEUROLOGIC: sensation is intact to 5.07 monofilament at 5/5 sites bilateral.  Light touch is intact bilateral, vibratory sensation intact bilateral  MUSCULOSKELETAL: acceptable muscle strength, tone and stability bilateral.  No gross boney pedal deformities noted.  No pain, crepitus or limitation noted with foot and ankle range of motion bilateral.   DERMATOLOGIC: skin is warm, supple, and dry.  Color, texture, and turgor of skin within normal limits.  No open wounds are noted.  No preulcerative lesions are seen. Nails ar thick, discolored, dystrophic and clinically mycotic x 10     Assessment     ICD-10-CM   1. Pain due to onychomycosis of toenail  B35.1    M79.676        Plan  Discussed exam findings with the patient.  Recommended a nail debridement.  This was carried out today with sterile nail nippers and a power burr without complication.  Periodic routine nail debridement recommended every 3 months or as needed for follow-up.

## 2023-11-25 ENCOUNTER — Other Ambulatory Visit: Payer: Self-pay

## 2023-11-25 ENCOUNTER — Telehealth: Payer: Self-pay

## 2023-11-25 DIAGNOSIS — I8511 Secondary esophageal varices with bleeding: Secondary | ICD-10-CM

## 2023-11-25 DIAGNOSIS — K7460 Unspecified cirrhosis of liver: Secondary | ICD-10-CM

## 2023-11-25 DIAGNOSIS — K7290 Hepatic failure, unspecified without coma: Secondary | ICD-10-CM

## 2023-11-25 NOTE — Telephone Encounter (Signed)
 Called patient and scheduled him for his EGD on 02/29/24 at 8:15 am at [facility] hospital. Informed patient I will send his instructions on MyChart to review and informed him to contact me if he has any questions to contact our office. Patient verbalized understanding.

## 2023-11-25 NOTE — Telephone Encounter (Signed)
 Patient called and stated he was returning a call back to [place]. Patient is requesting a call back. Please advise.

## 2023-11-25 NOTE — Telephone Encounter (Signed)
 Dr. Marietta Shorter hospital schedule for August is now available. Left message for patient to return my call to schedule EGD.

## 2023-12-03 ENCOUNTER — Other Ambulatory Visit: Payer: Self-pay

## 2023-12-06 DIAGNOSIS — K7460 Unspecified cirrhosis of liver: Secondary | ICD-10-CM | POA: Diagnosis not present

## 2023-12-06 DIAGNOSIS — R188 Other ascites: Secondary | ICD-10-CM | POA: Diagnosis not present

## 2023-12-06 DIAGNOSIS — K754 Autoimmune hepatitis: Secondary | ICD-10-CM | POA: Diagnosis not present

## 2023-12-27 ENCOUNTER — Ambulatory Visit: Payer: Self-pay

## 2023-12-27 NOTE — Telephone Encounter (Signed)
 Noted

## 2023-12-27 NOTE — Telephone Encounter (Signed)
 FYI Only or Action Required?: FYI only for provider.  Patient was last seen in primary care on 11/08/2023 by Newlin, Enobong, MD. Called Nurse Triage reporting Foot Swelling. Symptoms began several weeks ago. Interventions attempted: Other: elevation of RLE. Symptoms are: unchanged.  Triage Disposition: See Physician Within 24 Hours  Patient/caregiver understands and will follow disposition?: Yes  Copied from CRM [id number]. Topic: Clinical - Red Word Triage >> Dec 27, 2023  9:05 AM Silvana PARAS wrote: Red Word that prompted transfer to Nurse Triage: Appt scheduled for tomorrow. Pt has swelling in right foot for about 3 weeks. Reason for Disposition  [1] MODERATE leg swelling (e.g., swelling extends up to knees) AND [2] new-onset or worsening  Answer Assessment - Initial Assessment Questions 1. ONSET: When did the swelling start? (e.g., minutes, hours, days)     3-4 weeks  2. LOCATION: What part of the leg is swollen?  Are both legs swollen or just one leg?     R foot and ankle  3. SEVERITY: How bad is the swelling? (e.g., localized; mild, moderate, severe)   - Localized: Small area of swelling localized to one leg.   - MILD pedal edema: Swelling limited to foot and ankle, pitting edema < 1/4 inch (6 mm) deep, rest and elevation eliminate most or all swelling.   - MODERATE edema: Swelling of lower leg to knee, pitting edema > 1/4 inch (6 mm) deep, rest and elevation only partially reduce swelling.   - SEVERE edema: Swelling extends above knee, facial or hand swelling present.      Mild to moderate  5. PAIN: Is the swelling painful to touch? If Yes, ask: How painful is it?   (Scale 1-10; mild, moderate or severe)     no 8. MEDICAL HISTORY: Do you have a history of blood clots (e.g., DVT), cancer, heart failure, kidney disease, or liver failure?     Liver issues 10. OTHER SYMPTOMS: Do you have any other symptoms? (e.g., chest pain, difficulty breathing)  Protocols used: Leg  Swelling and Edema-A-AH

## 2023-12-28 ENCOUNTER — Other Ambulatory Visit: Payer: Self-pay

## 2023-12-28 ENCOUNTER — Ambulatory Visit: Attending: Nurse Practitioner | Admitting: Nurse Practitioner

## 2023-12-28 ENCOUNTER — Encounter: Payer: Self-pay | Admitting: Nurse Practitioner

## 2023-12-28 VITALS — BP 113/76 | HR 60 | Resp 20 | Ht 74.0 in | Wt 190.8 lb

## 2023-12-28 DIAGNOSIS — R2241 Localized swelling, mass and lump, right lower limb: Secondary | ICD-10-CM | POA: Diagnosis not present

## 2023-12-28 MED ORDER — DICLOFENAC SODIUM 1 % EX GEL
4.0000 g | Freq: Four times a day (QID) | CUTANEOUS | 1 refills | Status: DC
  Filled 2023-12-28: qty 100, 6d supply, fill #0

## 2023-12-28 NOTE — Progress Notes (Signed)
 Assessment & Plan:  [name] was seen today for foot swelling.  Diagnoses and all orders for this visit:  Localized swelling of right foot -     diclofenac  Sodium (VOLTAREN ) 1 % GEL; Apply 4 g topically 4 (four) times daily.    Patient has been counseled on age-appropriate routine health concerns for screening and prevention. These are reviewed and up-to-date. Referrals have been placed accordingly. Immunizations are up-to-date or declined.    Subjective:   Chief Complaint  Patient presents with   Foot Swelling    Right foot swelling. Swelling first noticed several weeks ago.    Timothy [name] 63 y.o. male presents to office today for right foot and ankle swelling. He is a patient of Dr. Newlin.   He has a past medical history of Autoimmune hepatitis, hepatitis C, cirrhosis of liver, portal hypertension, esophageal varices, and Thrombocytopenia.    He has been experiencing pain and swelling of right foot and ankle.  Last uric acid on Nov 08, 2023 was normal.  His primary care ordered x-rays of both feet on Nov 10, 2023 due to his reported symptoms of chronic pain at that time.  He did not endorse swelling at the time he saw his PCP. She did prescribe voltaren  gel however he did not pick this up.  He was referred to podiatry for onychomycosis and bilateral foot pain however he did not discuss his foot pain or swelling during his visit   Right ankle: Right Ankle IMPRESSION: 1. Mild medial ankle osteoarthritis. 2. Moderate-to-large degenerative spur dorsal to the talar neck, unchanged.   Left ankle: IMPRESSION: 1. Mild dorsal talonavicular degenerative spurring. 2. Chronic ossicle in between the distal fibula and lateral talus. This likely reflects the sequela of remote trauma.   Review of Systems  Constitutional:  Negative for fever, malaise/fatigue and weight loss.  HENT: Negative.  Negative for nosebleeds.   Eyes: Negative.  Negative for blurred vision, double vision and  photophobia.  Respiratory: Negative.  Negative for cough and shortness of breath.   Cardiovascular:  Positive for leg swelling. Negative for chest pain and palpitations.  Gastrointestinal: Negative.  Negative for heartburn, nausea and vomiting.  Musculoskeletal:  Positive for joint pain. Negative for myalgias.  Neurological: Negative.  Negative for dizziness, focal weakness, seizures and headaches.  Psychiatric/Behavioral: Negative.  Negative for suicidal ideas.     Past Medical History:  Diagnosis Date   Autoimmune hepatitis (HCC)    Cirrhosis of liver (HCC)    Hypertension    Thrombocytopenia (HCC)     Past Surgical History:  Procedure Laterality Date   ESOPHAGEAL BANDING  10/20/2022   Procedure: ESOPHAGEAL BANDING;  Surgeon: Albertus Gordy HERO, MD;  Location: [facility] ENDOSCOPY;  Service: Gastroenterology;;   ESOPHAGEAL BANDING  11/11/2022   Procedure: ESOPHAGEAL BANDING;  Surgeon: Albertus Gordy HERO, MD;  Location: [facility] ENDOSCOPY;  Service: Gastroenterology;;   ESOPHAGEAL BANDING N/A 12/07/2022   Procedure: ESOPHAGEAL BANDING;  Surgeon: Abran Norleen SAILOR, MD;  Location: [facility] ENDOSCOPY;  Service: Gastroenterology;  Laterality: N/A;   ESOPHAGOGASTRODUODENOSCOPY N/A 10/20/2022   Procedure: ESOPHAGOGASTRODUODENOSCOPY (EGD);  Surgeon: Albertus Gordy HERO, MD;  Location: [facility] ENDOSCOPY;  Service: Gastroenterology;  Laterality: N/A;  per ICU   ESOPHAGOGASTRODUODENOSCOPY (EGD) WITH PROPOFOL  N/A 11/11/2022   Procedure: ESOPHAGOGASTRODUODENOSCOPY (EGD) WITH PROPOFOL ;  Surgeon: Albertus Gordy HERO, MD;  Location: [facility] ENDOSCOPY;  Service: Gastroenterology;  Laterality: N/A;   ESOPHAGOGASTRODUODENOSCOPY (EGD) WITH PROPOFOL  N/A 12/07/2022   Procedure: ESOPHAGOGASTRODUODENOSCOPY (EGD) WITH PROPOFOL ;  Surgeon: Abran Norleen SAILOR,  MD;  Location: [facility] ENDOSCOPY;  Service: Gastroenterology;  Laterality: N/A;   ESOPHAGOGASTRODUODENOSCOPY (EGD) WITH PROPOFOL  N/A 03/01/2023   Procedure: ESOPHAGOGASTRODUODENOSCOPY (EGD) WITH PROPOFOL ;  Surgeon:  Albertus Gordy HERO, MD;  Location: [facility] ENDOSCOPY;  Service: Gastroenterology;  Laterality: N/A;   teeth      Family History  Problem Relation Age of Onset   Hypertension Sister     Social History Reviewed with no changes to be made today.   Outpatient Medications Prior to Visit  Medication Sig Dispense Refill   azaTHIOprine  (IMURAN ) 50 MG tablet Take 2 tablets (100 mg total) by mouth daily. Please note increased dose. 60 tablet 11   Blood Pressure Monitoring (BLOOD PRESSURE KIT) DEVI Use to measure blood pressure 1 each 0   carvedilol  (COREG ) 3.125 MG tablet Take 1 tablet (3.125 mg total) by mouth in the morning and 1 tablet (3.125 mg total) in the evening. Take with meals. 180 tablet 3   spironolactone  (ALDACTONE ) 100 MG tablet Take 1 tablet (100 mg total) by mouth daily. 90 tablet 11   cyclobenzaprine  (FLEXERIL ) 10 MG tablet Take 1 tablet (10 mg total) by mouth at bedtime as needed for muscle spasms. (Patient not taking: Reported on 12/28/2023) 30 tablet 1   predniSONE  (DELTASONE ) 10 MG tablet Take 1 tablet (10 mg total) by mouth daily with breakfast. (Patient not taking: Reported on 12/28/2023) 30 tablet 1   triamcinolone  cream (KENALOG ) 0.1 % Apply 1 Application topically 2 (two) times daily. (Patient not taking: Reported on 12/28/2023) 453 g 0   diclofenac  Sodium (VOLTAREN ) 1 % GEL Apply 4 g topically 4 (four) times daily. (Patient not taking: Reported on 12/28/2023) 100 g 1   No facility-administered medications prior to visit.    Allergies  Allergen Reactions   Nsaids     Patient denies any allergy to NSAIDs (particular Advil ) He takes this medication frequently without any adverse side effects    Amoxicillin  Rash       Objective:    BP 113/76 (BP Location: Left Arm, Patient Position: Sitting, Cuff Size: Normal)   Pulse 60   Resp 20   Ht 6' 2 (1.88 m)   Wt 190 lb 12.8 oz (86.5 kg)   SpO2 100%   BMI 24.50 kg/m  Wt Readings from Last 3 Encounters:  12/28/23 190 lb 12.8 oz  (86.5 kg)  11/08/23 190 lb (86.2 kg)  10/20/23 190 lb 6.4 oz (86.4 kg)    Physical Exam Vitals and nursing note reviewed.  Constitutional:      Appearance: He is well-developed.  HENT:     Head: Normocephalic and atraumatic.   Cardiovascular:     Rate and Rhythm: Normal rate and regular rhythm.     Heart sounds: Normal heart sounds. No murmur heard.    No friction rub. No gallop.  Pulmonary:     Effort: Pulmonary effort is normal. No tachypnea or respiratory distress.     Breath sounds: Normal breath sounds. No decreased breath sounds, wheezing, rhonchi or rales.  Chest:     Chest wall: No tenderness.  Abdominal:     General: Bowel sounds are normal.     Palpations: Abdomen is soft.   Musculoskeletal:        General: Normal range of motion.     Cervical back: Normal range of motion.     Right ankle: Swelling present.     Left ankle: Swelling (minimal swelling R>L) present.     Right foot: Swelling present.  Skin:    General: Skin is warm and dry.   Neurological:     Mental Status: He is alert and oriented to person, place, and time.     Coordination: Coordination normal.   Psychiatric:        Behavior: Behavior normal. Behavior is cooperative.        Thought Content: Thought content normal.        Judgment: Judgment normal.          Patient has been counseled extensively about nutrition and exercise as well as the importance of adherence with medications and regular follow-up. The patient was given clear instructions to go to ER or return to medical center if symptoms don't improve, worsen or new problems develop. The patient verbalized understanding.   Follow-up: Return if symptoms worsen or fail to improve.   Haze LELON Servant, FNP-BC [facility] [place], [place] [phone]   12/28/2023, 9:48 AM

## 2024-01-04 ENCOUNTER — Other Ambulatory Visit: Payer: Self-pay

## 2024-01-05 ENCOUNTER — Ambulatory Visit (INDEPENDENT_AMBULATORY_CARE_PROVIDER_SITE_OTHER): Admitting: Podiatry

## 2024-01-05 ENCOUNTER — Other Ambulatory Visit: Payer: Self-pay

## 2024-01-05 ENCOUNTER — Encounter: Payer: Self-pay | Admitting: Podiatry

## 2024-01-05 DIAGNOSIS — R600 Localized edema: Secondary | ICD-10-CM | POA: Diagnosis not present

## 2024-01-05 DIAGNOSIS — M216X1 Other acquired deformities of right foot: Secondary | ICD-10-CM | POA: Diagnosis not present

## 2024-01-05 NOTE — Progress Notes (Signed)
 Subjective:   Patient ID: Timothy [name], male   DOB: 63 y.o.   MRN: [id number]   HPI Patient presents with swelling of both feet and states that he thinks maybe he had a Doppler done but he is not sure he is somewhat confused and states has been present for several months   ROS      Objective:  Physical Exam  Neurovascular status was found to be intact he does have moderate discomfort into his calf muscle bilateral and has significant ankle midfoot forefoot edema right over left     Assessment:  Probability that this is a local process with swelling but cannot rule out systemic blood clot or other pathology and he is not a good historian so I am not able to tell what he has or has not done test wise     Plan:  H&P reviewed this with him and applied ankle compression stockings.  I did discuss at great length if he should start to develop any issues with breathing or other pathology for him to go to the emergency room and I am ordering a Doppler to rule out blood clot and put the order in today and gave him strict instructions of any change or to occur to present immediately to us  or his family physician

## 2024-01-06 NOTE — Telephone Encounter (Signed)
 Informed patient that after looking at Dr. Pamula recommendations at his last office visit, he wanted him to have a an EGD and colonoscopy at the hospital. Informed patient that I only scheduled him for a EGD for 02/29/24. Informed patient I will have to reschedule him to another day in August to accommodate both procedures. Patient verbalized understanding. Informed patient I will put the new instructions on MyChart but will also review them with him when he comes into the office to see Dr. Albertus on 01/19/24. Patient verbalized understanding.

## 2024-01-11 ENCOUNTER — Ambulatory Visit (HOSPITAL_COMMUNITY)
Admission: RE | Admit: 2024-01-11 | Discharge: 2024-01-11 | Disposition: A | Discharge status: Home / Self Care | Source: Ambulatory Visit | Attending: Podiatry | Admitting: Podiatry

## 2024-01-11 DIAGNOSIS — R600 Localized edema: Secondary | ICD-10-CM | POA: Insufficient documentation

## 2024-01-13 ENCOUNTER — Ambulatory Visit: Payer: Self-pay | Admitting: Podiatry

## 2024-01-19 ENCOUNTER — Ambulatory Visit: Admitting: Internal Medicine

## 2024-01-19 ENCOUNTER — Other Ambulatory Visit: Payer: Self-pay

## 2024-01-19 ENCOUNTER — Encounter: Payer: Self-pay | Admitting: Internal Medicine

## 2024-01-19 VITALS — BP 106/78 | HR 72 | Ht 73.5 in | Wt 192.0 lb

## 2024-01-19 DIAGNOSIS — I8500 Esophageal varices without bleeding: Secondary | ICD-10-CM

## 2024-01-19 DIAGNOSIS — K754 Autoimmune hepatitis: Secondary | ICD-10-CM

## 2024-01-19 DIAGNOSIS — K7460 Unspecified cirrhosis of liver: Secondary | ICD-10-CM | POA: Diagnosis not present

## 2024-01-19 DIAGNOSIS — K7290 Hepatic failure, unspecified without coma: Secondary | ICD-10-CM

## 2024-01-19 DIAGNOSIS — I8510 Secondary esophageal varices without bleeding: Secondary | ICD-10-CM

## 2024-01-19 DIAGNOSIS — Z1211 Encounter for screening for malignant neoplasm of colon: Secondary | ICD-10-CM

## 2024-01-19 DIAGNOSIS — R600 Localized edema: Secondary | ICD-10-CM

## 2024-01-19 MED ORDER — NA SULFATE-K SULFATE-MG SULF 17.5-3.13-1.6 GM/177ML PO SOLN
1.0000 | Freq: Once | ORAL | 0 refills | Status: AC
  Filled 2024-01-19: qty 354, 1d supply, fill #0

## 2024-01-19 MED ORDER — FUROSEMIDE 20 MG PO TABS
20.0000 mg | ORAL_TABLET | Freq: Every day | ORAL | 1 refills | Status: DC
  Filled 2024-01-19: qty 30, 30d supply, fill #0
  Filled 2024-02-17: qty 30, 30d supply, fill #1

## 2024-01-19 NOTE — Progress Notes (Signed)
 Subjective:    Patient ID: Timothy [name], male    DOB: October 11, 1960, 63 y.o.   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  He is here today with his wife and I last saw him on 09/22/2023.  He is also establish care with [facility] for adjustments in his AIH therapy.   His liver enzymes have shown improvement since the first week of June. He was previously on prednisone , which has been discontinued, and his azathioprine  dosage has been increased to 100 mg.  He has significant swelling in his feet, which is bothersome. He is currently taking spironolactone  100 mg daily for fluid management but was not using furosemide  much previously. The swelling increases with prolonged standing, and he uses compression hose to manage it. His abdomen feels bloated, especially after consuming juice, but not with water.  His current medications include spironolactone  100 mg, which he takes in the morning. He recalls previously taking furosemide  and is aware of its diuretic effects. He is concerned about the swelling and its impact on his daily activities.  He has a scheduled for EGD for surveillance upper endoscopy in August as well as colonoscopy for screening.   Review of Systems As per HPI, otherwise negative  Current Medications, Allergies, Past Medical History, Past Surgical History, Family History and Social History were reviewed in [employer] record.     Objective:   Physical Exam BP 106/78   Pulse 72   Ht 6' 1.5 (1.867 m)   Wt 192 lb (87.1 kg)   SpO2 98%   BMI 24.99 kg/m  Gen: awake, alert, NAD HEENT: anicteric  CV: RRR, no mrg Pulm: CTA b/l Abd: soft, NT/ND, +BS throughout Ext: no c/c, 1+ pretibial edema to the midshin Neuro: nonfocal, no asterixis  Labs reviewed by care  everywhere LABS WBC: 2.6 x 10^3/L (12/06/2023) Hemoglobin: 12.7 g/dL (93/97/7974) Platelets: 72 x 10^3/L (12/06/2023) INR: 1.1 (12/06/2023) AST: 87 U/L (12/06/2023) ALT: 49 U/L (12/06/2023) Alkaline Phosphatase: 115 U/L (12/06/2023) Total Bilirubin: 1.1 mg/dL (93/97/7974) Albumin: 3.2 g/dL (93/97/7974) IgG: 7193 mg/dL (93/97/7974)  CT ABDOMEN WITHOUT AND WITH CONTRAST   TECHNIQUE: Multidetector CT imaging of the abdomen was performed following the standard protocol before and following the bolus administration of intravenous contrast.   RADIATION DOSE REDUCTION: This exam was performed according to the departmental dose-optimization program which includes automated exposure control, adjustment of the mA and/or kV according to patient size and/or use of iterative reconstruction technique.   CONTRAST:  100mL ISOVUE -300 IOPAMIDOL  (ISOVUE -300) INJECTION 61%   COMPARISON:  Abdominal ultrasound, 10/22/2022   FINDINGS: Lower chest: No acute abnormality.   Hepatobiliary: Coarse, nodular cirrhotic morphology of the liver. Numerous gallstones contracted in the gallbladder. No gallbladder wall thickening or biliary ductal dilatation   Pancreas: Unremarkable. No pancreatic ductal dilatation or surrounding inflammatory changes.   Spleen: Mild splenomegaly.   Adrenals/Urinary Tract: Adrenal glands are unremarkable. Multiple small bilateral nonobstructive renal calculi with associated cortical scarring. No hydronephrosis.   Stomach/Bowel: Stomach is within normal limits. No evidence of bowel wall thickening, distention, or inflammatory changes.   Vascular/Lymphatic: Nonocclusive thrombus within the central superior mesenteric vein and portal confluence (series 4, image 70). Large gastroesophageal varices. No enlarged abdominal lymph nodes.   Other: No abdominal wall hernia or abnormality. No ascites.   Musculoskeletal: No acute or significant  osseous findings.    IMPRESSION: 1. Cirrhosis. No suspicious liver lesion. 2. Nonocclusive thrombus within the central superior mesenteric vein and portal confluence. 3. Large gastroesophageal varices. 4. Mild splenomegaly. 5. Cholelithiasis. 6. Multiple small bilateral nonobstructive renal calculi with associated renal cortical scarring. No hydronephrosis.   These results will be called to the ordering clinician or representative by the Radiologist Assistant, and communication documented in the PACS or [employer].     Electronically Signed   By: Marolyn JONETTA Jaksch M.D.   On: 02/14/2023 21:21     Assessment & Plan:  63 year old male with a history of autoimmune hepatitis cirrhosis with prior esophageal varices status post EVL, ascites, nonocclusive portal vein thrombosis who is here for follow-up.   AIH with cirrhosis Liver inflammation improving with decreasing liver enzyme levels. Azathioprine  dose increased to 100 mg. Prednisone  discontinued.  Mild leukopenia which liver clinic is following. - Continue azathioprine  100 mg. - Re-evaluate liver function tests in early September. - Defer to [facility]  Esophageal varices; status post previous EVL.  Last endoscopies varices too small to band in August 2024 Previous varices resolved. - Schedule upper endoscopy and colonoscopy for August or September in the outpatient hospital setting.  HCC screening  - Negative by CT in August 2020 for - Ultrasound right upper quadrant recommended for screening in August 2025  Peripheral edema Peripheral edema in feet. Discussed adding low-dose furosemide  to enhance diuresis. Compression hose recommended. Discussed increased urination with furosemide  and plan to monitor potassium and renal function after two weeks. - Add furosemide  20 mg in the morning with spironolactone  100 mg daily. - Recommend compression hose from [employer].  He was given the number. - Monitor potassium and renal function in two  weeks by BMP.  Colon cancer screening - Colonoscopy scheduled on the same day as upper endoscopy in the outpatient hospital setting  37-month follow-up with me  .

## 2024-01-19 NOTE — Patient Instructions (Addendum)
 We have sent the following medications to your pharmacy for you to pick up at your convenience: furosemide  20 mg daily.   Your provider has requested that you go to the basement level for lab work on 01/28/24 Press B on the elevator. The lab is located at the first door on the left as you exit the elevator.  Continue azathioprine .   You have been scheduled for an endoscopy and colonoscopy. Please follow the written instructions given to you at your visit today.  If you use inhalers (even only as needed), please bring them with you on the day of your procedure.  DO NOT TAKE 7 DAYS PRIOR TO TEST- Trulicity (dulaglutide) Ozempic, Wegovy (semaglutide) Mounjaro (tirzepatide) Bydureon Bcise (exanatide extended release)  DO NOT TAKE 1 DAY PRIOR TO YOUR TEST Rybelsus (semaglutide) Adlyxin (lixisenatide) Victoza (liraglutide) Byetta (exanatide) ___________________________________________________________________________

## 2024-01-20 ENCOUNTER — Other Ambulatory Visit: Payer: Self-pay

## 2024-01-26 ENCOUNTER — Other Ambulatory Visit: Payer: Self-pay

## 2024-01-31 ENCOUNTER — Other Ambulatory Visit (INDEPENDENT_AMBULATORY_CARE_PROVIDER_SITE_OTHER)

## 2024-01-31 DIAGNOSIS — I8510 Secondary esophageal varices without bleeding: Secondary | ICD-10-CM | POA: Diagnosis not present

## 2024-01-31 DIAGNOSIS — K7460 Unspecified cirrhosis of liver: Secondary | ICD-10-CM | POA: Diagnosis not present

## 2024-01-31 DIAGNOSIS — K754 Autoimmune hepatitis: Secondary | ICD-10-CM

## 2024-01-31 DIAGNOSIS — K7290 Hepatic failure, unspecified without coma: Secondary | ICD-10-CM

## 2024-01-31 LAB — BASIC METABOLIC PANEL WITH GFR
BUN: 21 mg/dL (ref 6–23)
CO2: 24 meq/L (ref 19–32)
Calcium: 8.7 mg/dL (ref 8.4–10.5)
Chloride: 111 meq/L (ref 96–112)
Creatinine, Ser: 1.24 mg/dL (ref 0.40–1.50)
GFR: 61.67 mL/min (ref >60.00)
Glucose, Bld: 94 mg/dL (ref 70–99)
Potassium: 4.3 meq/L (ref 3.5–5.1)
Sodium: 140 meq/L (ref 135–145)

## 2024-02-04 ENCOUNTER — Other Ambulatory Visit: Payer: Self-pay

## 2024-02-04 ENCOUNTER — Other Ambulatory Visit: Payer: Self-pay | Admitting: Family Medicine

## 2024-02-04 ENCOUNTER — Other Ambulatory Visit: Payer: Self-pay | Admitting: Internal Medicine

## 2024-02-04 DIAGNOSIS — K7460 Unspecified cirrhosis of liver: Secondary | ICD-10-CM

## 2024-02-04 MED ORDER — SPIRONOLACTONE 100 MG PO TABS
100.0000 mg | ORAL_TABLET | Freq: Every day | ORAL | 0 refills | Status: DC
  Filled 2024-02-04: qty 90, 90d supply, fill #0

## 2024-02-04 NOTE — Telephone Encounter (Signed)
 Requested Prescriptions  Pending Prescriptions Disp Refills   spironolactone  (ALDACTONE ) 100 MG tablet 90 tablet 0    Sig: Take 1 tablet (100 mg total) by mouth daily.     Cardiovascular: Diuretics - Aldosterone Antagonist Passed - 02/04/2024  2:54 PM      Passed - Cr in normal range and within 180 days    Creatinine  Date Value Ref Range Status  01/27/2016 1.2 0.7 - 1.3 mg/dL Final   Creatinine, Ser  Date Value Ref Range Status  01/31/2024 1.24 0.40 - 1.50 mg/dL Final   Creatinine, POC  Date Value Ref Range Status  11/25/2016 100 mg/dL Final         Passed - K in normal range and within 180 days    Potassium  Date Value Ref Range Status  01/31/2024 4.3 3.5 - 5.1 mEq/L Final  01/27/2016 4.1 3.5 - 5.1 mEq/L Final         Passed - Na in normal range and within 180 days    Sodium  Date Value Ref Range Status  01/31/2024 140 135 - 145 mEq/L Final  11/08/2023 136 134 - 144 mmol/L Final  01/27/2016 141 136 - 145 mEq/L Final         Passed - eGFR is 30 or above and within 180 days    GFR calc Af Amer  Date Value Ref Range Status  07/30/2020 71 >59 mL/min/1.73 Final    Comment:    **In accordance with recommendations from the NKF-ASN Task force,**   [facility] is in the process of updating its eGFR calculation to the   2021 CKD-EPI creatinine equation that estimates kidney function   without a race variable.    GFR, Estimated  Date Value Ref Range Status  10/24/2022 45 (L) >60 mL/min Final    Comment:    (NOTE) Calculated using the CKD-EPI Creatinine Equation (2021)    GFR  Date Value Ref Range Status  01/31/2024 61.67 >60.00 mL/min Final    Comment:    Calculated using the CKD-EPI Creatinine Equation (2021)   eGFR  Date Value Ref Range Status  11/08/2023 55 (L) >59 mL/min/1.73 Final         Passed - Last BP in normal range    BP Readings from Last 1 Encounters:  01/19/24 106/78         Passed - Valid encounter within last 6 months    Recent Outpatient  Visits           1 month ago Localized swelling of right foot   [facility] [place]. [facility] Theotis Haze ORN, NP   2 months ago Paresthesia   [facility] [place]. [facility] Delbert Clam, MD   3 months ago Chronic pain of right knee   [facility] Celestia Rosaline SQUIBB, NP   5 months ago Cirrhosis of liver without ascites, unspecified hepatic cirrhosis type [facility])   [facility] [place]. [facility] Delbert Clam, MD   7 months ago Muscle cramp   [facility] [place] [facility] [place]. [facility] Delbert Clam, MD

## 2024-02-07 ENCOUNTER — Telehealth: Payer: Self-pay | Admitting: Internal Medicine

## 2024-02-07 ENCOUNTER — Ambulatory Visit: Payer: Self-pay | Admitting: Internal Medicine

## 2024-02-07 NOTE — Telephone Encounter (Signed)
 Patient states he was confused about the ultrasound that was scheduled for the 6th. Informed patient I had sent him a MyChart message and he had not read it yet but he is due for ultrasound this month and I went ahead and scheduled it for him. Patient states he would prefer a later morning appt. Called and r/s patient's ultrasound for 02/10/24 at 11:00am. Patient informed and verbalized understanding.

## 2024-02-07 NOTE — Telephone Encounter (Signed)
 PT is calling because he is confused as to the appointments he is scheduled for. He has a CT scheduled for 8/6, EGD 8/19 and he needs clarity. Please advise.

## 2024-02-08 ENCOUNTER — Telehealth: Payer: Self-pay | Admitting: Internal Medicine

## 2024-02-08 NOTE — Telephone Encounter (Signed)
 Inbound call from patient's insurance [facility] Medicaid stating a expedited request for prior authorization for 8/7 ultrasound needs to be called in at [phone]. Also states prior authorization is needed for 8/19 hospital colonoscopy. Please advise, thank you.

## 2024-02-08 NOTE — Telephone Encounter (Signed)
 Patient called requesting a call regarding previous notes. Also stated he would like a call from [place]. Please advise, thank you

## 2024-02-08 NOTE — Telephone Encounter (Signed)
 Returned patient's call and informed him I did see his message that his ultrasound and hospital procedure needs to be pre-certed. Informed patient that the pre-cert team has been notified but I will also notify them that this needs to be addressed. Patient verbalized understanding.

## 2024-02-08 NOTE — Telephone Encounter (Deleted)
 Inbound call from patient's insurance stating

## 2024-02-09 ENCOUNTER — Ambulatory Visit (HOSPITAL_COMMUNITY)

## 2024-02-10 ENCOUNTER — Telehealth: Payer: Self-pay | Admitting: Family Medicine

## 2024-02-10 ENCOUNTER — Ambulatory Visit (HOSPITAL_COMMUNITY)

## 2024-02-10 NOTE — Telephone Encounter (Signed)
 Patient requesting f/u call to discuss rescheduling hospital procedure.   Please advise. Thank you

## 2024-02-10 NOTE — Telephone Encounter (Signed)
 Patient states the hospital had to cancel his ultrasound today. Patient states he ate breakfast this morning when he was suppose to be nothing to eat or drink after midnight. Informed patient I will call central scheduling and reschedule his ultrasound.

## 2024-02-10 NOTE — Telephone Encounter (Signed)
 Called pt and he states he specifically requested to speak to [employer].

## 2024-02-10 NOTE — Telephone Encounter (Signed)
 Copied from CRM #[id number]. Topic: Referral - Request for Referral >> Feb 10, 2024  9:04 AM [place] MATSU wrote: Did the patient discuss referral with their provider in the last year? Yes (If No - schedule appointment) (If Yes - send message)  Appointment offered? No  Type of order/referral and detailed reason for visit:  patient requesting referral to rheumatologist   Preference of office, provider, location:any   If referral order, have you been seen by this specialty before? No (If Yes, this issue or another issue? When? Where?  Can we respond through MyChart? Yes

## 2024-02-10 NOTE — Telephone Encounter (Signed)
 Patient has been rescheduled to 02/16/24 at 11:00 am. Informed patient nothing to eat or drink after midnight. Patient verbalized understanding.

## 2024-02-14 ENCOUNTER — Other Ambulatory Visit: Payer: Self-pay

## 2024-02-14 ENCOUNTER — Encounter (HOSPITAL_COMMUNITY): Payer: Self-pay | Admitting: Internal Medicine

## 2024-02-14 ENCOUNTER — Telehealth: Payer: Self-pay

## 2024-02-14 NOTE — Telephone Encounter (Signed)
 Copied from CRM #[id number]. Topic: Referral - Request for Referral >> Feb 14, 2024 10:25 AM [place] RAMAN wrote: Did the patient discuss referral with their provider in the last year? Yes (If No - schedule appointment) (If Yes - send message)  Appointment offered?No  Type of order/referral and detailed reason for visit: Rheumatology for Arthritis  Preference of office, provider, location:  [facility] [address], [place], [place] [address] Phone: [phone] Fax: [phone]  If referral order, have you been seen by this specialty before? No (If Yes, this issue or another issue? When? Where?  Can we respond through MyChart? Yes

## 2024-02-14 NOTE — Telephone Encounter (Signed)
 The specialist would require documentation of this new problem in my notes prior to a referral being placed.  He will need an appointment which can be virtual or in person.

## 2024-02-15 ENCOUNTER — Telehealth: Payer: Self-pay | Admitting: Family Medicine

## 2024-02-15 NOTE — Telephone Encounter (Signed)
 Confirmed appt for 8/13

## 2024-02-15 NOTE — Telephone Encounter (Signed)
 Patient has been called and scheduled a telephone visit to discuss.

## 2024-02-16 ENCOUNTER — Ambulatory Visit (HOSPITAL_COMMUNITY)
Admission: RE | Admit: 2024-02-16 | Discharge: 2024-02-16 | Disposition: A | Discharge status: Home / Self Care | Source: Ambulatory Visit | Attending: Internal Medicine | Admitting: Internal Medicine

## 2024-02-16 ENCOUNTER — Ambulatory Visit: Attending: Family Medicine | Admitting: Family Medicine

## 2024-02-16 ENCOUNTER — Telehealth: Payer: Self-pay

## 2024-02-16 ENCOUNTER — Encounter: Payer: Self-pay | Admitting: Family Medicine

## 2024-02-16 DIAGNOSIS — K7689 Other specified diseases of liver: Secondary | ICD-10-CM | POA: Diagnosis not present

## 2024-02-16 DIAGNOSIS — M25562 Pain in left knee: Secondary | ICD-10-CM | POA: Diagnosis not present

## 2024-02-16 DIAGNOSIS — M19071 Primary osteoarthritis, right ankle and foot: Secondary | ICD-10-CM

## 2024-02-16 DIAGNOSIS — K7460 Unspecified cirrhosis of liver: Secondary | ICD-10-CM | POA: Diagnosis not present

## 2024-02-16 DIAGNOSIS — K7290 Hepatic failure, unspecified without coma: Secondary | ICD-10-CM | POA: Diagnosis present

## 2024-02-16 DIAGNOSIS — M19072 Primary osteoarthritis, left ankle and foot: Secondary | ICD-10-CM

## 2024-02-16 DIAGNOSIS — M25561 Pain in right knee: Secondary | ICD-10-CM | POA: Diagnosis not present

## 2024-02-16 DIAGNOSIS — R188 Other ascites: Secondary | ICD-10-CM | POA: Diagnosis not present

## 2024-02-16 DIAGNOSIS — G8929 Other chronic pain: Secondary | ICD-10-CM | POA: Diagnosis not present

## 2024-02-16 DIAGNOSIS — K8020 Calculus of gallbladder without cholecystitis without obstruction: Secondary | ICD-10-CM | POA: Diagnosis not present

## 2024-02-16 NOTE — Progress Notes (Signed)
 Virtual Visit via Video Note  I connected with Timothy [name], on 02/16/2024 at 1:34 PM by video enabled telemedicine device and verified that I am speaking with the correct person using two identifiers.   Clinician has audio - video capabilities but patient was only able to operate/preferred audio.  Consent: I discussed the limitations, risks, security and privacy concerns of performing an evaluation and management service by telemedicine and the availability of in person appointments. I also discussed with the patient that there may be a patient responsible charge related to this service. The patient expressed understanding and agreed to proceed.   Location of Patient: Home  Location of Provider: Clinic   Persons participating in Telemedicine visit: Kathleen DELENA [name] Dr. Delbert    Discussed the use of AI scribe software for clinical note transcription with the patient, who gave verbal consent to proceed.  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  He experiences significant swelling in his foot, which is persistent and unresponsive to compression socks. The swelling is localized to one foot, preventing him from wearing regular socks or shoes. There is no pain associated with the swelling, and no other symptoms are present in the foot.   He has pain in his knees and legs, with a severity of 6 to 8 on a scale of 1 to 10. He uses Tylenol  occasionally for pain relief and finds some benefit from aspirin cream. He is unable to take ibuprofen  or Aleve.  In May, an x-ray of both ankles confirmed arthritis. Previous tests ruled out gout, and his vitamin B12, kidney, and liver functions were normal.      Past Medical History:  Diagnosis Date   Arthritis     Autoimmune hepatitis (HCC)    Cirrhosis of liver (HCC)    Hypertension    Thrombocytopenia (HCC)    Allergies  Allergen Reactions   Nsaids     Patient denies any allergy to NSAIDs (particular Advil ) He takes this medication frequently without any adverse side effects    Amoxicillin  Rash    Current Outpatient Medications on File Prior to Visit  Medication Sig Dispense Refill   azaTHIOprine  (IMURAN ) 50 MG tablet Take 2 tablets (100 mg total) by mouth daily. Please note increased dose. 60 tablet 11   Blood Pressure Monitoring (BLOOD PRESSURE KIT) DEVI Use to measure blood pressure 1 each 0   carvedilol  (COREG ) 3.125 MG tablet Take 1 tablet (3.125 mg total) by mouth in the morning and 1 tablet (3.125 mg total) in the evening. Take with meals. 180 tablet 3   furosemide  (LASIX ) 20 MG tablet Take 1 tablet (20 mg total) by mouth daily. 30 tablet 1   spironolactone  (ALDACTONE ) 100 MG tablet Take 1 tablet (100 mg total) by mouth daily. 90 tablet 0   No current facility-administered medications on file prior to visit.    ROS: See HPI  Observations/Objective: Awake, alert, oriented x3 Not in acute distress Normal mood      Latest Ref Rng & Units 01/31/2024   10:05 AM 11/08/2023   12:18 PM 07/15/2023    9:06 AM  CMP  Glucose 70 - 99 mg/dL 94  862  82   BUN 6 - 23 mg/dL 21  29  24    Creatinine 0.40 -  1.50 mg/dL 8.75  8.56  8.71   Sodium 135 - 145 mEq/L 140  136  141   Potassium 3.5 - 5.1 mEq/L 4.3  5.4  4.2   Chloride 96 - 112 mEq/L 111  104  108   CO2 19 - 32 mEq/L 24  22  26    Calcium 8.4 - 10.5 mg/dL 8.7  9.7  9.5   Total Protein 6.0 - 8.5 g/dL  7.0  7.6   Total Bilirubin 0.0 - 1.2 mg/dL  1.0  1.2   Alkaline Phos 44 - 121 IU/L  103  83   AST 0 - 40 IU/L  72  67   ALT 0 - 44 IU/L  80  43     Lipid Panel     Component Value Date/Time   CHOL 159 01/27/2022 0912   TRIG 50 01/27/2022 0912   HDL 62 01/27/2022 0912   CHOLHDL 2.6 01/27/2022 0912   CHOLHDL 2.8 03/29/2015 1111    VLDL 14 03/29/2015 1111   LDLCALC 86 01/27/2022 0912   LABVLDL 11 01/27/2022 0912    Lab Results  Component Value Date   HGBA1C 5.6 11/08/2023     Assessment and plan:   Assessment & Plan Osteoarthritis of bilateral ankles with associated unilateral foot swelling and knee pain 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 Rheumatoid arthritis and gout ruled out. X-rays confirmed arthritis. Swelling and pain likely due to osteoarthritis. Current pain level 6-8/10. Unable to take ibuprofen  or Aleve, using aspirin cream. -No evidence of Rheumatological condition - Refer to orthopedics for evaluation and management. - Continue aspirin cream for pain management until orthopedic evaluation. - Elevate affected foot to manage swelling.     No orders of the defined types were placed in this encounter.   Follow Up Instructions:   Return for previously scheduled appointment.  I discussed the assessment and treatment plan with the patient. The patient was provided an opportunity to ask questions and all were answered. The patient agreed with the plan and demonstrated an understanding of the instructions.   The patient was advised to call back or seek an in-person evaluation if the symptoms worsen or if the condition fails to improve as anticipated.     I provided 11 minutes total of Telehealth time during this encounter including median intraservice time, reviewing previous notes, investigations, ordering medications, medical decision making, coordinating care and patient verbalized understanding at the end of the visit.     Corrina Sabin, MD, FAAFP. [facility] [place], [place] [phone]   02/16/2024, 1:34 PM

## 2024-02-16 NOTE — Telephone Encounter (Signed)
 Procedure:EGD Procedure date: 02/22/24 Procedure location: [facility] Arrival Time: 1:15 Spoke with the patient Y/N: Y Any prep concerns? N  Has the patient obtained the prep from the pharmacy ? N Do you have a care partner and transportation: Y Any additional concerns? N

## 2024-02-17 ENCOUNTER — Other Ambulatory Visit: Payer: Self-pay

## 2024-02-21 ENCOUNTER — Telehealth: Payer: Self-pay

## 2024-02-21 NOTE — Telephone Encounter (Signed)
 See previous encounter.   Copied from CRM #[id number]. Topic: Appointments - Appointment Info/Confirmation >> Feb 21, 2024 10:14 AM Zy'onna H wrote: Patient is calling for information regarding his [facility] - Orthopedic Referral.  He stated he was given the incorrect phone number - gave pt the phone number listed and verified it on my personal device (It is working and in service.) Gave him the following Phone Number: [phone]  Please advise patient on any other needed  information on his referral and how to schedule.

## 2024-02-21 NOTE — Telephone Encounter (Signed)
 Called patient to advise that the number for [facility] is indeed working on my end. Patient states he was still unable to call them and is requesting to see if we can schedule it for him.

## 2024-02-21 NOTE — Telephone Encounter (Signed)
 NOTED

## 2024-02-21 NOTE — Telephone Encounter (Signed)
 Patient has been called and given contact information to schedule appointment.    Copied from CRM #[id number]. Topic: Referral - Status >> Feb 21, 2024  9:31 AM Timothy [name] wrote: Reason for CRM: Mr Timothy [name] called in today to check to see the status of the orthopedic referral that he requested. Please call pt advise.

## 2024-02-22 ENCOUNTER — Ambulatory Visit (HOSPITAL_BASED_OUTPATIENT_CLINIC_OR_DEPARTMENT_OTHER): Admitting: Anesthesiology

## 2024-02-22 ENCOUNTER — Other Ambulatory Visit: Payer: Self-pay

## 2024-02-22 ENCOUNTER — Ambulatory Visit (HOSPITAL_COMMUNITY): Admitting: Anesthesiology

## 2024-02-22 ENCOUNTER — Encounter (HOSPITAL_COMMUNITY)
Admission: RE | Disposition: A | Discharge status: Home / Self Care | Payer: Self-pay | Source: Home / Self Care | Attending: Internal Medicine

## 2024-02-22 ENCOUNTER — Encounter (HOSPITAL_COMMUNITY): Payer: Self-pay | Admitting: Internal Medicine

## 2024-02-22 ENCOUNTER — Ambulatory Visit (HOSPITAL_COMMUNITY)
Admission: RE | Admit: 2024-02-22 | Discharge: 2024-02-22 | Disposition: A | Discharge status: Home / Self Care | Attending: Internal Medicine | Admitting: Internal Medicine

## 2024-02-22 DIAGNOSIS — K269 Duodenal ulcer, unspecified as acute or chronic, without hemorrhage or perforation: Secondary | ICD-10-CM

## 2024-02-22 DIAGNOSIS — I8500 Esophageal varices without bleeding: Secondary | ICD-10-CM

## 2024-02-22 DIAGNOSIS — Z8719 Personal history of other diseases of the digestive system: Secondary | ICD-10-CM | POA: Insufficient documentation

## 2024-02-22 DIAGNOSIS — K7460 Unspecified cirrhosis of liver: Secondary | ICD-10-CM | POA: Diagnosis not present

## 2024-02-22 DIAGNOSIS — K2970 Gastritis, unspecified, without bleeding: Secondary | ICD-10-CM

## 2024-02-22 DIAGNOSIS — I10 Essential (primary) hypertension: Secondary | ICD-10-CM | POA: Diagnosis not present

## 2024-02-22 DIAGNOSIS — K208 Other esophagitis without bleeding: Secondary | ICD-10-CM

## 2024-02-22 DIAGNOSIS — Z79899 Other long term (current) drug therapy: Secondary | ICD-10-CM | POA: Diagnosis not present

## 2024-02-22 DIAGNOSIS — K648 Other hemorrhoids: Secondary | ICD-10-CM | POA: Diagnosis not present

## 2024-02-22 DIAGNOSIS — D123 Benign neoplasm of transverse colon: Secondary | ICD-10-CM | POA: Diagnosis not present

## 2024-02-22 DIAGNOSIS — K2289 Other specified disease of esophagus: Secondary | ICD-10-CM | POA: Diagnosis not present

## 2024-02-22 DIAGNOSIS — Z1211 Encounter for screening for malignant neoplasm of colon: Secondary | ICD-10-CM | POA: Diagnosis not present

## 2024-02-22 DIAGNOSIS — K6389 Other specified diseases of intestine: Secondary | ICD-10-CM

## 2024-02-22 DIAGNOSIS — K209 Esophagitis, unspecified without bleeding: Secondary | ICD-10-CM | POA: Diagnosis not present

## 2024-02-22 DIAGNOSIS — I8510 Secondary esophageal varices without bleeding: Secondary | ICD-10-CM

## 2024-02-22 DIAGNOSIS — Z09 Encounter for follow-up examination after completed treatment for conditions other than malignant neoplasm: Secondary | ICD-10-CM | POA: Diagnosis present

## 2024-02-22 DIAGNOSIS — I8511 Secondary esophageal varices with bleeding: Secondary | ICD-10-CM

## 2024-02-22 DIAGNOSIS — Z9889 Other specified postprocedural states: Secondary | ICD-10-CM

## 2024-02-22 DIAGNOSIS — K3189 Other diseases of stomach and duodenum: Secondary | ICD-10-CM

## 2024-02-22 HISTORY — PX: COLONOSCOPY: SHX5424

## 2024-02-22 HISTORY — DX: Unspecified osteoarthritis, unspecified site: M19.90

## 2024-02-22 HISTORY — PX: ESOPHAGOGASTRODUODENOSCOPY: SHX5428

## 2024-02-22 SURGERY — EGD (ESOPHAGOGASTRODUODENOSCOPY)
Anesthesia: Monitor Anesthesia Care

## 2024-02-22 MED ORDER — LIDOCAINE 2% (20 MG/ML) 5 ML SYRINGE
INTRAMUSCULAR | Status: DC | PRN
Start: 2024-02-22 — End: 2024-02-22
  Administered 2024-02-22: 100 mg via INTRAVENOUS

## 2024-02-22 MED ORDER — SODIUM CHLORIDE 0.9 % IV SOLN: INTRAVENOUS | Status: DC

## 2024-02-22 MED ORDER — PROPOFOL 10 MG/ML IV BOLUS
INTRAVENOUS | Status: DC | PRN
  Administered 2024-02-22: 30 mg via INTRAVENOUS
  Administered 2024-02-22: 70 mg via INTRAVENOUS

## 2024-02-22 MED ORDER — PANTOPRAZOLE SODIUM 40 MG PO TBEC
40.0000 mg | DELAYED_RELEASE_TABLET | Freq: Every day | ORAL | 2 refills | Status: AC
  Filled 2024-02-22: qty 90, 90d supply, fill #0
  Filled 2024-05-22: qty 90, 90d supply, fill #1

## 2024-02-22 MED ORDER — PROPOFOL 500 MG/50ML IV EMUL
INTRAVENOUS | Status: DC | PRN
  Administered 2024-02-22: 125 ug/kg/min via INTRAVENOUS

## 2024-02-22 MED ORDER — PHENYLEPHRINE 80 MCG/ML (10ML) SYRINGE FOR IV PUSH (FOR BLOOD PRESSURE SUPPORT)
PREFILLED_SYRINGE | INTRAVENOUS | Status: DC | PRN
  Administered 2024-02-22: 160 ug via INTRAVENOUS

## 2024-02-22 NOTE — H&P (Signed)
 GASTROENTEROLOGY PROCEDURE H&P NOTE   Primary Care Physician: Delbert Clam, MD    Reason for Procedure:  Surveillance of esophageal varices/secondary prophylaxis of variceal bleeding; colon cancer screening  Plan:    EGD and colonoscopy  Patient is appropriate for endoscopic procedure(s) in the outpatient hospital  setting.  The nature of the procedure, as well as the risks, benefits, and alternatives were carefully and thoroughly reviewed with the patient. Ample time for discussion and questions allowed. The patient understood, was satisfied, and agreed to proceed.     HPI: Timothy [name] is a 63 y.o. male who presents for EGD and colonoscopy.  Medical history as below.  Tolerated the prep.  No recent chest pain or shortness of breath.  No abdominal pain today.  Past Medical History:  Diagnosis Date   Arthritis    Autoimmune hepatitis (HCC)    Cirrhosis of liver (HCC)    Hypertension    Thrombocytopenia (HCC)     Past Surgical History:  Procedure Laterality Date   ESOPHAGEAL BANDING  10/20/2022   Procedure: ESOPHAGEAL BANDING;  Surgeon: Albertus Gordy HERO, MD;  Location: [facility] ENDOSCOPY;  Service: Gastroenterology;;   ESOPHAGEAL BANDING  11/11/2022   Procedure: ESOPHAGEAL BANDING;  Surgeon: Albertus Gordy HERO, MD;  Location: [facility] ENDOSCOPY;  Service: Gastroenterology;;   ESOPHAGEAL BANDING N/A 12/07/2022   Procedure: ESOPHAGEAL BANDING;  Surgeon: Abran Norleen SAILOR, MD;  Location: [facility] ENDOSCOPY;  Service: Gastroenterology;  Laterality: N/A;   ESOPHAGOGASTRODUODENOSCOPY N/A 10/20/2022   Procedure: ESOPHAGOGASTRODUODENOSCOPY (EGD);  Surgeon: Albertus Gordy HERO, MD;  Location: [facility] ENDOSCOPY;  Service: Gastroenterology;  Laterality: N/A;  per ICU   ESOPHAGOGASTRODUODENOSCOPY (EGD) WITH PROPOFOL  N/A 11/11/2022   Procedure: ESOPHAGOGASTRODUODENOSCOPY (EGD) WITH PROPOFOL ;  Surgeon: Albertus Gordy HERO, MD;  Location: [facility] ENDOSCOPY;  Service: Gastroenterology;  Laterality: N/A;   ESOPHAGOGASTRODUODENOSCOPY  (EGD) WITH PROPOFOL  N/A 12/07/2022   Procedure: ESOPHAGOGASTRODUODENOSCOPY (EGD) WITH PROPOFOL ;  Surgeon: Abran Norleen SAILOR, MD;  Location: [facility] ENDOSCOPY;  Service: Gastroenterology;  Laterality: N/A;   ESOPHAGOGASTRODUODENOSCOPY (EGD) WITH PROPOFOL  N/A 03/01/2023   Procedure: ESOPHAGOGASTRODUODENOSCOPY (EGD) WITH PROPOFOL ;  Surgeon: Albertus Gordy HERO, MD;  Location: [facility] ENDOSCOPY;  Service: Gastroenterology;  Laterality: N/A;   teeth      Prior to Admission medications   Medication Sig Start Date End Date Taking? Authorizing Provider  azaTHIOprine  (IMURAN ) 50 MG tablet Take 2 tablets (100 mg total) by mouth daily. Please note increased dose. 11/10/23  Yes   carvedilol  (COREG ) 3.125 MG tablet Take 1 tablet (3.125 mg total) by mouth in the morning and 1 tablet (3.125 mg total) in the evening. Take with meals. 02/16/23  Yes   furosemide  (LASIX ) 20 MG tablet Take 1 tablet (20 mg total) by mouth daily. 01/19/24  Yes Jake Goodson, Gordy HERO, MD  spironolactone  (ALDACTONE ) 100 MG tablet Take 1 tablet (100 mg total) by mouth daily. 02/04/24  Yes Newlin, Enobong, MD  Blood Pressure Monitoring (BLOOD PRESSURE KIT) DEVI Use to measure blood pressure 03/29/23   Brien Belvie BRAVO, MD    Current Facility-Administered Medications  Medication Dose Route Frequency Provider Last Rate Last Admin   0.9 %  sodium chloride  infusion   Intravenous Continuous Tabrina Esty, Gordy HERO, MD       0.9 %  sodium chloride  infusion   Intravenous Continuous Amiee Wiley, Gordy HERO, MD 20 mL/hr at 02/22/24 1427 New Bag at 02/22/24 1427    Allergies as of 11/25/2023 - Review Complete 11/22/2023  Allergen Reaction Noted   Nsaids  04/27/2022   Amoxicillin   Rash 11/08/2023    Family History  Problem Relation Age of Onset   Hypertension Sister     Social History   Socioeconomic History   Marital status: Single    Spouse name: Not on file   Number of children: 0   Years of education: Not on file   Highest education level: Not on file  Occupational History    Occupation: retired  Tobacco Use   Smoking status: Never   Smokeless tobacco: Never  Vaping Use   Vaping status: Never Used  Substance and Sexual Activity   Alcohol use: No    Alcohol/week: 0.0 standard drinks of alcohol   Drug use: No   Sexual activity: Yes  Other Topics Concern   Not on file  Social History Narrative   Not on file   Social Drivers of Health   Financial Resource Strain: Not on file  Food Insecurity: Food Insecurity Present (06/15/2023)   Hunger Vital Sign    Worried About Running Out of Food in the Last Year: Never true    Ran Out of Food in the Last Year: Sometimes true  Transportation Needs: No Transportation Needs (06/15/2023)   PRAPARE - Administrator, Civil Service (Medical): No    Lack of Transportation (Non-Medical): No  Physical Activity: Not on file  Stress: Not on file  Social Connections: Unknown (06/15/2023)   Social Connection and Isolation Panel    Frequency of Communication with Friends and Family: Three times a week    Frequency of Social Gatherings with Friends and Family: Three times a week    Attends Religious Services: Not on file    Active Member of Clubs or Organizations: Not on file    Attends Club or Organization Meetings: Not on file    Marital Status: Not on file  Intimate Partner Violence: Not At Risk (06/15/2023)   Humiliation, Afraid, Rape, and Kick questionnaire    Fear of Current or Ex-Partner: No    Emotionally Abused: No    Physically Abused: No    Sexually Abused: No    Physical Exam: Vital signs in last 24 hours: @BP  131/72   Temp (!) 97.5 F (36.4 C) (Temporal)   Resp 16   Ht 6' 2 (1.88 m)   Wt 88.5 kg   SpO2 99%   BMI 25.04 kg/m  GEN: NAD EYE: Sclerae anicteric ENT: MMM CV: Non-tachycardic Pulm: CTA b/l GI: Soft, NT/ND NEURO:  Alert & Oriented x 3   Gordy Starch, MD [facility]  02/22/2024 3:26 PM;e

## 2024-02-22 NOTE — Anesthesia Preprocedure Evaluation (Signed)
 Anesthesia Evaluation  Patient identified by MRN, date of birth, ID band Patient awake    Reviewed: Allergy & Precautions, NPO status , Patient's Chart, lab work & pertinent test results, reviewed documented beta blocker date and time   Airway Mallampati: II  TM Distance: >3 FB     Dental  (+) Edentulous Lower, Partial Upper, Dental Advisory Given   Pulmonary neg pulmonary ROS   Pulmonary exam normal breath sounds clear to auscultation       Cardiovascular hypertension, Pt. on medications and Pt. on home beta blockers Normal cardiovascular exam Rhythm:Regular Rate:Normal     Neuro/Psych negative neurological ROS  negative psych ROS   GI/Hepatic negative GI ROS,,,(+) Cirrhosis   Esophageal Varices    , Hepatitis -, Autoimmune  Endo/Other    Renal/GU negative Renal ROS  negative genitourinary   Musculoskeletal  (+) Arthritis , Osteoarthritis,    Abdominal   Peds  Hematology Thrombocytopenia Plt- 64k   Anesthesia Other Findings   Reproductive/Obstetrics                              Anesthesia Physical Anesthesia Plan  ASA: 3  Anesthesia Plan: MAC   Post-op Pain Management: Minimal or no pain anticipated   Induction: Intravenous  PONV Risk Score and Plan: 2 and Treatment may vary due to age or medical condition, Propofol  infusion and Ondansetron   Airway Management Planned: Natural Airway and Nasal Cannula  Additional Equipment: None  Intra-op Plan:   Post-operative Plan:   Informed Consent: I have reviewed the patients History and Physical, chart, labs and discussed the procedure including the risks, benefits and alternatives for the proposed anesthesia with the patient or authorized representative who has indicated his/her understanding and acceptance.     Dental advisory given  Plan Discussed with: CRNA and Anesthesiologist  Anesthesia Plan Comments:           Anesthesia Quick Evaluation

## 2024-02-22 NOTE — Discharge Instructions (Signed)

## 2024-02-22 NOTE — Op Note (Signed)
 [facility] Patient Name: Timothy [name] Procedure Date: 02/22/2024 MRN: [id number] Attending MD: Gordy CHRISTELLA Starch , MD, [id number] Date of Birth: 1960-11-28 CSN: [id number] Age: 63 Admit Type: Outpatient Procedure:                Colonoscopy Indications:              Screening for colorectal malignant neoplasm Providers:                Gordy CHRISTELLA. Starch, MD, Darleene Bare, RN, Coye Bade, Technician Referring MD:             Corrina Sabin Medicines:                Monitored Anesthesia Care Complications:            No immediate complications. Estimated Blood Loss:     Estimated blood loss was minimal. Procedure:                Pre-Anesthesia Assessment:                           - Prior to the procedure, a History and Physical                            was performed, and patient medications and                            allergies were reviewed. The patient's tolerance of                            previous anesthesia was also reviewed. The risks                            and benefits of the procedure and the sedation                            options and risks were discussed with the patient.                            All questions were answered, and informed consent                            was obtained. Prior Anticoagulants: The patient has                            taken no anticoagulant or antiplatelet agents. ASA                            Grade Assessment: III - A patient with severe                            systemic disease. After reviewing the risks and  benefits, the patient was deemed in satisfactory                            condition to undergo the procedure.                           After obtaining informed consent, the colonoscope                            was passed under direct vision. Throughout the                            procedure, the patient's blood pressure, pulse, and                             oxygen saturations were monitored continuously. The                            CF-HQ190L ([id number]) Olympus colonoscope was                            introduced through the anus and advanced to the                            cecum, identified by appendiceal orifice and                            ileocecal valve. The CF-HQ190L ([id number]) Olympus                            colonoscope was introduced through the and advanced                            to the. The colonoscopy was performed without                            difficulty. The patient tolerated the procedure                            well. The quality of the bowel preparation was                            good. The ileocecal valve, appendiceal orifice, and                            rectum were photographed. Scope In: 4:32:34 PM Scope Out: 4:43:42 PM Scope Withdrawal Time: 0 hours 8 minutes 54 seconds  Total Procedure Duration: 0 hours 11 minutes 8 seconds  Findings:      The digital rectal exam was normal.      A 7 mm polyp was found in the transverse colon. The polyp was sessile.       The polyp was removed with a cold snare. Resection and retrieval were       complete.      Mildly congested mucosa with scattered  erythema consistent with mild       colopathy was found in the entire colon.      Internal hemorrhoids were found during retroflexion. The hemorrhoids       were medium-sized. Impression:               - One 7 mm polyp in the transverse colon, removed                            with a cold snare. Resected and retrieved.                           - Mild portal colopathy.                           - Internal hemorrhoids. Moderate Sedation:      N/A Recommendation:           - Patient has a contact number available for                            emergencies. The signs and symptoms of potential                            delayed complications were discussed with the                            patient.  Return to normal activities tomorrow.                            Written discharge instructions were provided to the                            patient.                           - Resume previous diet.                           - Continue present medications.                           - Await pathology results.                           - Repeat colonoscopy is recommended. The                            colonoscopy date will be determined after pathology                            results from today's exam become available for                            review. Procedure Code(s):        --- Professional ---  54614, Colonoscopy, flexible; with removal of                            tumor(s), polyp(s), or other lesion(s) by snare                            technique Diagnosis Code(s):        --- Professional ---                           Z12.11, Encounter for screening for malignant                            neoplasm of colon                           D12.3, Benign neoplasm of transverse colon (hepatic                            flexure or splenic flexure)                           K63.89, Other specified diseases of intestine                           K64.8, Other hemorrhoids CPT copyright 2022 American Medical Association. All rights reserved. The codes documented in this report are preliminary and upon coder review may  be revised to meet current compliance requirements. Gordy CHRISTELLA Starch, MD 02/22/2024 4:51:46 PM This report has been signed electronically. Number of Addenda: 0

## 2024-02-22 NOTE — Op Note (Signed)
 [facility] Patient Name: Timothy [name] Procedure Date: 02/22/2024 MRN: [id number] Attending MD: Gordy CHRISTELLA Starch , MD, [id number] Date of Birth: May 21, 1961 CSN: [id number] Age: 63 Admit Type: Outpatient Procedure:                Upper GI endoscopy Indications:              Follow-up of esophageal varices, hx of variceal                            bleeding (EVL April, May and June 2024; scarring                            without varices Aug 2024) Providers:                Gordy CHRISTELLA. Starch, MD, Darleene Bare, RN, Coye Bade, Technician Referring MD:             Corrina Sabin Medicines:                Monitored Anesthesia Care Complications:            No immediate complications. Estimated Blood Loss:     Estimated blood loss was minimal. Procedure:                Pre-Anesthesia Assessment:                           - Prior to the procedure, a History and Physical                            was performed, and patient medications and                            allergies were reviewed. The patient's tolerance of                            previous anesthesia was also reviewed. The risks                            and benefits of the procedure and the sedation                            options and risks were discussed with the patient.                            All questions were answered, and informed consent                            was obtained. Prior Anticoagulants: The patient has                            taken no anticoagulant or antiplatelet agents. ASA  Grade Assessment: III - A patient with severe                            systemic disease. After reviewing the risks and                            benefits, the patient was deemed in satisfactory                            condition to undergo the procedure.                           After obtaining informed consent, the endoscope was                             passed under direct vision. Throughout the                            procedure, the patient's blood pressure, pulse, and                            oxygen saturations were monitored continuously. The                            [id number]-H190 ([id number]) Olympus endoscope was introduced                            through the mouth, and advanced to the second part                            of duodenum. The upper GI endoscopy was                            accomplished without difficulty. The patient                            tolerated the procedure well. Scope In: Scope Out: Findings:      Post variceal banding scars were found in the lower third of the       esophagus. No residual varices.      A single 4 mm mucosal papule (nodule) with contact oozing was found in       the cardia. Biopsies were taken with a cold forceps for histology.      Diffuse mild inflammation characterized by erythema was found in the       gastric body and in the gastric antrum. Biopsies were taken with a cold       forceps for Helicobacter pylori testing.      Multiple non-bleeding superficial duodenal ulcers were found in the       duodenal bulb.      The second portion of the duodenum was normal. Impression:               - Scarring in the lower third of the esophagus from  previous variceal banding. No residual varices.                           - A single mucosal papule (nodule) found in the                            gastric cardia (just below GE junction). Biopsied.                           - Gastritis. Biopsied.                           - Non-bleeding duodenal ulcers in bulb (peptic                            appearing).                           - Normal second portion of the duodenum. Moderate Sedation:      N/A Recommendation:           - Patient has a contact number available for                            emergencies. The signs and symptoms of potential                             delayed complications were discussed with the                            patient. Return to normal activities tomorrow.                            Written discharge instructions were provided to the                            patient.                           - Resume previous diet.                           - Continue present medications.                           - Resume pantoprazole  40 mg daily.                           - Await pathology results.                           - Repeat upper endoscopy in 1 year for surveillance. Procedure Code(s):        --- Professional ---                           [id number], Esophagogastroduodenoscopy, flexible,  transoral; with biopsy, single or multiple Diagnosis Code(s):        --- Professional ---                           K22.89, Other specified disease of esophagus                           K31.89, Other diseases of stomach and duodenum                           K29.70, Gastritis, unspecified, without bleeding                           K26.9, Duodenal ulcer, unspecified as acute or                            chronic, without hemorrhage or perforation                           I85.00, Esophageal varices without bleeding CPT copyright 2022 [employer]. All rights reserved. The codes documented in this report are preliminary and upon coder review may  be revised to meet current compliance requirements. Gordy CHRISTELLA Starch, MD 02/22/2024 4:30:52 PM This report has been signed electronically. Number of Addenda: 0

## 2024-02-22 NOTE — Anesthesia Postprocedure Evaluation (Signed)
 Anesthesia Post Note  Patient: Timothy [name]  Procedure(s) Performed: EGD (ESOPHAGOGASTRODUODENOSCOPY) COLONOSCOPY     Patient location during evaluation: PACU Anesthesia Type: MAC Level of consciousness: awake and alert and oriented Pain management: pain level controlled Vital Signs Assessment: post-procedure vital signs reviewed and stable Respiratory status: spontaneous breathing, nonlabored ventilation and respiratory function stable Cardiovascular status: stable and blood pressure returned to baseline Postop Assessment: no apparent nausea or vomiting Anesthetic complications: no   No notable events documented.  Last Vitals:  Vitals:   02/22/24 1700 02/22/24 1705  BP: 127/79   Pulse: 68 66  Resp: 16 18  Temp:    SpO2: 99% 100%    Last Pain:  Vitals:   02/22/24 1705  TempSrc:   PainSc: 0-No pain                 Cuca Benassi A.

## 2024-02-22 NOTE — Transfer of Care (Signed)
 Immediate Anesthesia Transfer of Care Note  Patient: Timothy [name]  Procedure(s) Performed: Procedure(s): EGD (ESOPHAGOGASTRODUODENOSCOPY) (N/A) COLONOSCOPY (N/A)  Patient Location: Endoscopy Unit  Anesthesia Type:MAC  Level of Consciousness:  sedated, patient cooperative and responds to stimulation  Airway & Oxygen Therapy:Patient Spontanous Breathing and Patient connected to face mask oxgen  Post-op Assessment:  Report given to PACU RN and Post -op Vital signs reviewed and stable  Post vital signs:  Reviewed and stable  Last Vitals:  Vitals:   02/22/24 1415  BP: 131/72  Resp: 16  Temp: (!) 36.4 C  SpO2: 99%    Complications: No apparent anesthesia complications

## 2024-02-23 ENCOUNTER — Other Ambulatory Visit (HOSPITAL_BASED_OUTPATIENT_CLINIC_OR_DEPARTMENT_OTHER): Payer: Self-pay

## 2024-02-23 ENCOUNTER — Other Ambulatory Visit: Payer: Self-pay

## 2024-02-23 MED ORDER — CARVEDILOL 3.125 MG PO TABS
ORAL_TABLET | ORAL | 3 refills | Status: AC
  Filled 2024-02-23: qty 180, 90d supply, fill #0
  Filled 2024-05-22: qty 180, 90d supply, fill #1

## 2024-02-24 ENCOUNTER — Encounter (HOSPITAL_COMMUNITY): Payer: Self-pay | Admitting: Internal Medicine

## 2024-02-24 ENCOUNTER — Ambulatory Visit: Payer: Self-pay | Admitting: Internal Medicine

## 2024-02-25 ENCOUNTER — Other Ambulatory Visit: Payer: Self-pay

## 2024-02-25 LAB — SURGICAL PATHOLOGY

## 2024-02-28 ENCOUNTER — Encounter: Payer: Self-pay | Admitting: Physician Assistant

## 2024-02-28 ENCOUNTER — Other Ambulatory Visit: Payer: Self-pay

## 2024-02-28 ENCOUNTER — Ambulatory Visit: Admitting: Physician Assistant

## 2024-02-28 ENCOUNTER — Ambulatory Visit: Payer: Self-pay | Admitting: Internal Medicine

## 2024-02-28 DIAGNOSIS — M25561 Pain in right knee: Secondary | ICD-10-CM

## 2024-02-28 DIAGNOSIS — I890 Lymphedema, not elsewhere classified: Secondary | ICD-10-CM | POA: Diagnosis not present

## 2024-02-28 DIAGNOSIS — I872 Venous insufficiency (chronic) (peripheral): Secondary | ICD-10-CM | POA: Diagnosis not present

## 2024-02-28 DIAGNOSIS — M25562 Pain in left knee: Secondary | ICD-10-CM

## 2024-02-28 NOTE — Progress Notes (Signed)
 Office Visit Note   Patient: Timothy [name]           Date of Birth: [birth year]           MRN: [id number] Visit Date: 02/28/2024              Requested by: Delbert Clam, MD [address] [place],  [place] [address] PCP: Delbert Clam, MD  Chief Complaint  Patient presents with   Other    Patient referred by PCP for bilateral knee pain-      HPI: 63 y/o male with 3 month history of B LE edema into the ankle and foot.  He denies injury or recent LE surgery.  He has a history of liver cirrhosis.  He states he did have increased ascites and increased edema in both LE.     Assessment & Plan: Visit Diagnoses:  1. Pain in both knees, unspecified chronicity   2. Lymphedema   3. Venous insufficiency (chronic) (peripheral)     Plan: mixed lymphedema and venous insufficiency.  We will place him in Vive compression socks daily.  Elevation when at rest in the supine position with his legs above his heart.  Ambulate as tolerates.  He may nee lymphedema pumps in the future.      Follow-Up Instructions: Return in about 4 weeks (around 03/27/2024).   Ortho Exam  Patient is alert, oriented, no adenopathy, well-dressed, normal affect, normal respiratory effort. He has swollen B LE the left 40 cm > right 37 cm calf measure.  He has a dorsal foot hump and edematous ankles.  No open wounds or cellulitis noted.  Non tender to palpation.        Imaging: No results found. No images are attached to the encounter.  Labs: Lab Results  Component Value Date   HGBA1C 5.6 11/08/2023   HGBA1C 5.9 (H) 06/11/2020   HGBA1C 6.0 (H) 10/31/2019   LABURIC 5.5 11/08/2023   LABURIC 6.5 04/20/2017     Lab Results  Component Value Date   ALBUMIN 3.6 (L) 11/08/2023   ALBUMIN 3.5 07/15/2023   ALBUMIN 3.7 (L) 06/15/2023    Lab Results  Component Value Date   MG 2.3 10/21/2022   MG 1.6 (L) 10/20/2022   MG 1.7 10/20/2022   Lab Results  Component Value Date   VD25OH 34.8  11/25/2016   VD25OH 22 (L) 11/15/2012    No results found for: PREALBUMIN    Latest Ref Rng & Units 11/08/2023   12:18 PM 07/15/2023    9:06 AM 03/16/2023    2:11 PM  CBC EXTENDED  WBC 3.4 - 10.8 x10E3/uL 3.7  2.9  3.0   RBC 4.14 - 5.80 x10E6/uL 4.29  4.09  4.47   Hemoglobin 13.0 - 17.7 g/dL 86.9  87.2  88.2   HCT 37.5 - 51.0 % 40.1  38.4  36.4   Platelets 150 - 450 x10E3/uL 64  62.0  95   NEUT# 1.4 - 7.0 x10E3/uL 2.3  1.5  1.4   Lymph# 0.7 - 3.1 x10E3/uL 0.7  0.7  0.9      There is no height or weight on file to calculate BMI.  Orders:  Orders Placed This Encounter  Procedures   XR KNEE 3 VIEW RIGHT   XR Knee 1-2 Views Left   No orders of the defined types were placed in this encounter.    Procedures: No procedures performed  Clinical Data: No additional findings.  ROS:  All other systems negative, except as noted in the HPI. Review of Systems  Objective: Vital Signs: There were no vitals taken for this visit.  Specialty Comments:  No specialty comments available.  PMFS History: Patient Active Problem List   Diagnosis Date Noted   Secondary esophageal varices without bleeding (HCC) 02/22/2024   Gastric nodule 02/22/2024   Duodenum ulcer 02/22/2024   Colon cancer screening 02/22/2024   Benign neoplasm of transverse colon 02/22/2024   History of hepatitis C 01/05/2023   Esophageal varices with bleeding in diseases classified elsewhere (HCC) 10/20/2022   Portal hypertension with esophageal varices (HCC) 10/20/2022   Multiple benign nevi 01/27/2022   Thrombocytopenia (HCC) 10/24/2015   Liver cirrhosis (HCC) 10/24/2015   Autoimmune hepatitis (HCC) 03/28/2013   Essential hypertension 11/15/2012   Past Medical History:  Diagnosis Date   Arthritis    Autoimmune hepatitis (HCC)    Cirrhosis of liver (HCC)    Hypertension    Thrombocytopenia (HCC)     Family History  Problem Relation Age of Onset   Hypertension Sister     Past Surgical History:   Procedure Laterality Date   COLONOSCOPY N/A 02/22/2024   Procedure: COLONOSCOPY;  Surgeon: Albertus Gordy HERO, MD;  Location: [facility] ENDOSCOPY;  Service: Gastroenterology;  Laterality: N/A;   ESOPHAGEAL BANDING  10/20/2022   Procedure: ESOPHAGEAL BANDING;  Surgeon: Albertus Gordy HERO, MD;  Location: [facility] ENDOSCOPY;  Service: Gastroenterology;;   ESOPHAGEAL BANDING  11/11/2022   Procedure: ESOPHAGEAL BANDING;  Surgeon: Albertus Gordy HERO, MD;  Location: [facility] ENDOSCOPY;  Service: Gastroenterology;;   ESOPHAGEAL BANDING N/A 12/07/2022   Procedure: ESOPHAGEAL BANDING;  Surgeon: Abran Norleen SAILOR, MD;  Location: [facility] ENDOSCOPY;  Service: Gastroenterology;  Laterality: N/A;   ESOPHAGOGASTRODUODENOSCOPY N/A 10/20/2022   Procedure: ESOPHAGOGASTRODUODENOSCOPY (EGD);  Surgeon: Albertus Gordy HERO, MD;  Location: [facility] ENDOSCOPY;  Service: Gastroenterology;  Laterality: N/A;  per ICU   ESOPHAGOGASTRODUODENOSCOPY N/A 02/22/2024   Procedure: EGD (ESOPHAGOGASTRODUODENOSCOPY);  Surgeon: Albertus Gordy HERO, MD;  Location: [facility]OSCOPY;  Service: Gastroenterology;  Laterality: N/A;   ESOPHAGOGASTRODUODENOSCOPY (EGD) WITH PROPOFOL  N/A 11/11/2022   Procedure: ESOPHAGOGASTRODUODENOSCOPY (EGD) WITH PROPOFOL ;  Surgeon: Albertus Gordy HERO, MD;  Location[facility] ENDOSCOPY;  Service: Gastroenterology;  Laterality: N/A;   ESOPHAGOGASTRODUODENOSCOPY (EGD) WITH PROPOFOL  N/A 12/07/2022   Procedure: ESOPHAGOGASTRODUODENOSCOPY (EGD) WITH PROPOFOL ;  Surgeon: Abran Norleen SAILOR, MD;  Location: [facility] ENDOSCOPY;  Service: Gastroenterology;  Laterality: N/A;   ESOPHAGOGASTRODUODENOSCOPY (EGD) WITH PROPOFOL  N/A 03/01/2023   Procedure: ESOPHAGOGASTRODUODENOSCOPY (EGD) WITH PROPOFOL ;  Surgeon: Albertus Gordy HERO, MD;  Location: [facility] ENDOSCOPY;  Service: Gastroenterology;  Laterality: N/A;   teeth     Social History   Occupational History   Occupation: retired  Tobacco Use   Smoking status: Never   Smokeless tobacco: Never  Vaping Use   Vaping status: Never Used  Substance and Sexual  Activity   Alcohol use: No    Alcohol/week: 0.0 standard drinks of alcohol   Drug use: No   Sexual activity: Yes

## 2024-03-08 ENCOUNTER — Other Ambulatory Visit: Payer: Self-pay

## 2024-03-27 ENCOUNTER — Ambulatory Visit (INDEPENDENT_AMBULATORY_CARE_PROVIDER_SITE_OTHER): Admitting: Physician Assistant

## 2024-03-27 ENCOUNTER — Other Ambulatory Visit: Payer: Self-pay

## 2024-03-27 ENCOUNTER — Encounter: Payer: Self-pay | Admitting: Physician Assistant

## 2024-03-27 DIAGNOSIS — M19072 Primary osteoarthritis, left ankle and foot: Secondary | ICD-10-CM | POA: Diagnosis not present

## 2024-03-27 DIAGNOSIS — M19071 Primary osteoarthritis, right ankle and foot: Secondary | ICD-10-CM

## 2024-03-27 DIAGNOSIS — I890 Lymphedema, not elsewhere classified: Secondary | ICD-10-CM

## 2024-03-27 DIAGNOSIS — M25561 Pain in right knee: Secondary | ICD-10-CM

## 2024-03-27 DIAGNOSIS — G8929 Other chronic pain: Secondary | ICD-10-CM

## 2024-03-27 NOTE — Progress Notes (Addendum)
 Office Visit Note   Patient: Timothy [name]           Date of Birth: [birth year]           MRN: [id number] Visit Date: 03/27/2024              Requested by: Delbert Clam, MD [address] [place],  [place] [address] PCP: Delbert Clam, MD  Chief Complaint  Patient presents with   Right Leg - Edema, Follow-up   Left Leg - Edema, Follow-up      HPI: 63 y/o male with 3 month history of B LE edema into the ankle and foot.   He has the appearance of mixed lymphedema and venous insufficiency.  The edema is from the knees into the toes.  He was prescribed Vive compression socks and has had improvement in the amount of daily swelling.  He continues to have edema into the toes.    Assessment & Plan: Visit Diagnoses:  1. Chronic pain of right knee     Plan: He has improved LE edema, he will continue to wear the compression socks daily.  He is happy with the results.  He continues to have mild toe edema, but improved.  If he has acute OA flair up of the knees or feet we will address this in the future with steroid injections as first line treatment.   Follow-Up Instructions: Return in about 4 weeks (around 04/24/2024).   Ortho Exam  Patient is alert, oriented, no adenopathy, well-dressed, normal affect, normal respiratory effort. Decreased edema I the lower legs B.  Palpable DP pulses B LE without skin changes.  No open wounds.  He has right knee pain with walking after sitting for a while and achy pain when trying to sleep some nights.  He denies swelling, popping or clicking.  No cellulitis.  No franke joint line tenderness to palpation.  He also dicussed anterior ankle pains/dorsal foot.    Lymphedema Bilateral LE > a year Hyperpigmentation, pitting edema, recurrent  chronic liver disease, particularly advanced cirrhosis, can cause lymphedema due to lymphatic dysfunction, which leads to impaired fluid homeostasis and fluid buildup in tissues. He has tried conservative  treatment to include elevation and compression socks.    His measurements today are L ankle 25 cm, thigh 45 cm.  Right ankle 26 cm, thigh 43 cm.  Imaging: Knee x rays show mild lateral joint line narrowing, no fractures, cyst or spurring.    Foot/ankle x ray Moderate-to-large degenerative spur dorsal to the talar neck, unchanged.  Bilateral ankle mortise is symmetric and intact.  Review of   Labs: Lab Results  Component Value Date   HGBA1C 5.6 11/08/2023   HGBA1C 5.9 (H) 06/11/2020   HGBA1C 6.0 (H) 10/31/2019   LABURIC 5.5 11/08/2023   LABURIC 6.5 04/20/2017     Lab Results  Component Value Date   ALBUMIN 3.6 (L) 11/08/2023   ALBUMIN 3.5 07/15/2023   ALBUMIN 3.7 (L) 06/15/2023    Lab Results  Component Value Date   MG 2.3 10/21/2022   MG 1.6 (L) 10/20/2022   MG 1.7 10/20/2022   Lab Results  Component Value Date   VD25OH 34.8 11/25/2016   VD25OH 22 (L) 11/15/2012    No results found for: PREALBUMIN    Latest Ref Rng & Units 11/08/2023   12:18 PM 07/15/2023    9:06 AM 03/16/2023    2:11 PM  CBC EXTENDED  WBC 3.4 - 10.8 x10E3/uL 3.7  2.9  3.0   RBC 4.14 - 5.80 x10E6/uL 4.29  4.09  4.47   Hemoglobin 13.0 - 17.7 g/dL 86.9  87.2  88.2   HCT 37.5 - 51.0 % 40.1  38.4  36.4   Platelets 150 - 450 x10E3/uL 64  62.0  95   NEUT# 1.4 - 7.0 x10E3/uL 2.3  1.5  1.4   Lymph# 0.7 - 3.1 x10E3/uL 0.7  0.7  0.9      There is no height or weight on file to calculate BMI.  Orders:  Orders Placed This Encounter  Procedures   XR Knee 1-2 Views Right   No orders of the defined types were placed in this encounter.    Procedures: No procedures performed  Clinical Data: No additional findings.  ROS:  All other systems negative, except as noted in the HPI. Review of Systems  Objective: Vital Signs: There were no vitals taken for this visit.  Specialty Comments:  No specialty comments available.  PMFS History: Patient Active Problem List   Diagnosis Date Noted    Secondary esophageal varices without bleeding (HCC) 02/22/2024   Gastric nodule 02/22/2024   Duodenum ulcer 02/22/2024   Colon cancer screening 02/22/2024   Benign neoplasm of transverse colon 02/22/2024   History of hepatitis C 01/05/2023   Esophageal varices with bleeding in diseases classified elsewhere 10/20/2022   Portal hypertension with esophageal varices (HCC) 10/20/2022   Multiple benign nevi 01/27/2022   Thrombocytopenia 10/24/2015   Liver cirrhosis (HCC) 10/24/2015   Autoimmune hepatitis (HCC) 03/28/2013   Essential hypertension 11/15/2012   Past Medical History:  Diagnosis Date   Arthritis    Autoimmune hepatitis (HCC)    Cirrhosis of liver (HCC)    Hypertension    Thrombocytopenia     Family History  Problem Relation Age of Onset   Hypertension Sister     Past Surgical History:  Procedure Laterality Date   COLONOSCOPY N/A 02/22/2024   Procedure: COLONOSCOPY;  Surgeon: Albertus Gordy HERO, MD;  Location: [facility] ENDOSCOPY;  Service: Gastroenterology;  Laterality: N/A;   ESOPHAGEAL BANDING  10/20/2022   Procedure: ESOPHAGEAL BANDING;  Surgeon: Albertus Gordy HERO, MD;  Location: [facility] ENDOSCOPY;  Service: Gastroenterology;;   ESOPHAGEAL BANDING  11/11/2022   Procedure: ESOPHAGEAL BANDING;  Surgeon: Albertus Gordy HERO, MD;  Location: [facility] ENDOSCOPY;  Service: Gastroenterology;;   ESOPHAGEAL BANDING N/A 12/07/2022   Procedure: ESOPHAGEAL BANDING;  Surgeon: Abran Norleen SAILOR, MD;  Location: [facility]OSCOPY;  Service: Gastroenterology;  Laterality: N/A;   ESOPHAGOGASTRODUODENOSCOPY N/A 10/20/2022   Procedure: ESOPHAGOGASTRODUODENOSCOPY (EGD);  Surgeon: Albertus Gordy HERO, MD;  Location[facility] ENDOSCOPY;  Service: Gastroenterology;  Laterality: N/A;  per ICU   ESOPHAGOGASTRODUODENOSCOPY N/A 02/22/2024   Procedure: EGD (ESOPHAGOGASTRODUODENOSCOPY);  Surgeon: Albertus Gordy HERO, MD;  Location: [facility] ENDOSCOPY;  Service: Gastroenterology;  Laterality: N/A;   ESOPHAGOGASTRODUODENOSCOPY (EGD) WITH PROPOFOL  N/A 11/11/2022    Procedure: ESOPHAGOGASTRODUODENOSCOPY (EGD) WITH PROPOFOL ;  Surgeon: Albertus Gordy HERO, MD;  Location: [facility] ENDOSCOPY;  Service: Gastroenterology;  Laterality: N/A;   ESOPHAGOGASTRODUODENOSCOPY (EGD) WITH PROPOFOL  N/A 12/07/2022   Procedure: ESOPHAGOGASTRODUODENOSCOPY (EGD) WITH PROPOFOL ;  Surgeon: Abran Norleen SAILOR, MD;  Location: [facility] ENDOSCOPY;  Service: Gastroenterology;  Laterality: N/A;   ESOPHAGOGASTRODUODENOSCOPY (EGD) WITH PROPOFOL  N/A 03/01/2023   Procedure: ESOPHAGOGASTRODUODENOSCOPY (EGD) WITH PROPOFOL ;  Surgeon: Albertus Gordy HERO, MD;  Location: [facility] ENDOSCOPY;  Service: Gastroenterology;  Laterality: N/A;   teeth     Social History   Occupational History   Occupation: retired  Tobacco Use   Smoking  status: Never   Smokeless tobacco: Never  Vaping Use   Vaping status: Never Used  Substance and Sexual Activity   Alcohol use: No    Alcohol/week: 0.0 standard drinks of alcohol   Drug use: No   Sexual activity: Yes

## 2024-04-07 ENCOUNTER — Other Ambulatory Visit: Payer: Self-pay

## 2024-04-24 ENCOUNTER — Ambulatory Visit (INDEPENDENT_AMBULATORY_CARE_PROVIDER_SITE_OTHER): Admitting: Physician Assistant

## 2024-04-24 DIAGNOSIS — I890 Lymphedema, not elsewhere classified: Secondary | ICD-10-CM

## 2024-04-24 DIAGNOSIS — I872 Venous insufficiency (chronic) (peripheral): Secondary | ICD-10-CM | POA: Diagnosis not present

## 2024-04-24 NOTE — Progress Notes (Unsigned)
 Office Visit Note   Patient: Timothy [name]           Date of Birth: 1961/05/31           MRN: [id number] Visit Date: 04/24/2024              Requested by: Delbert Clam, MD [address] [place] [address] [place],  [place] [address] PCP: Delbert Clam, MD  Chief Complaint  Patient presents with  . Right Leg - Follow-up    Edema  . Left Leg - Follow-up    Edema      HPI: 63 y/o male with 3 month history of B LE edema into the ankle and foot.   He has the appearance of mixed lymphedema and venous insufficiency.  The edema is from the knees into the toes.  He was prescribed Vive compression socks and has had improvement in the amount of daily swelling.   Assessment & Plan: Visit Diagnoses: No diagnosis found.  Plan: ***  Follow-Up Instructions: No follow-ups on file.   Ortho Exam  Patient is alert, oriented, no adenopathy, well-dressed, normal affect, normal respiratory effort. ***    Imaging: No results found. No images are attached to the encounter.  Labs: Lab Results  Component Value Date   HGBA1C 5.6 11/08/2023   HGBA1C 5.9 (H) 06/11/2020   HGBA1C 6.0 (H) 10/31/2019   LABURIC 5.5 11/08/2023   LABURIC 6.5 04/20/2017     Lab Results  Component Value Date   ALBUMIN 3.6 (L) 11/08/2023   ALBUMIN 3.5 07/15/2023   ALBUMIN 3.7 (L) 06/15/2023    Lab Results  Component Value Date   MG 2.3 10/21/2022   MG 1.6 (L) 10/20/2022   MG 1.7 10/20/2022   Lab Results  Component Value Date   VD25OH 34.8 11/25/2016   VD25OH 22 (L) 11/15/2012    No results found for: PREALBUMIN    Latest Ref Rng & Units 11/08/2023   12:18 PM 07/15/2023    9:06 AM 03/16/2023    2:11 PM  CBC EXTENDED  WBC 3.4 - 10.8 x10E3/uL 3.7  2.9  3.0   RBC 4.14 - 5.80 x10E6/uL 4.29  4.09  4.47   Hemoglobin 13.0 - 17.7 g/dL 86.9  87.2  88.2   HCT 37.5 - 51.0 % 40.1  38.4  36.4   Platelets 150 - 450 x10E3/uL 64  62.0  95   NEUT# 1.4 - 7.0 x10E3/uL 2.3  1.5  1.4   Lymph# 0.7 - 3.1 x10E3/uL 0.7   0.7  0.9      There is no height or weight on file to calculate BMI.  Orders:  No orders of the defined types were placed in this encounter.  No orders of the defined types were placed in this encounter.    Procedures: No procedures performed  Clinical Data: No additional findings.  ROS:  All other systems negative, except as noted in the HPI. Review of Systems  Objective: Vital Signs: There were no vitals taken for this visit.  Specialty Comments:  No specialty comments available.  PMFS History: Patient Active Problem List   Diagnosis Date Noted  . Secondary esophageal varices without bleeding (HCC) 02/22/2024  . Gastric nodule 02/22/2024  . Duodenum ulcer 02/22/2024  . Colon cancer screening 02/22/2024  . Benign neoplasm of transverse colon 02/22/2024  . History of hepatitis C 01/05/2023  . Esophageal varices with bleeding in diseases classified elsewhere 10/20/2022  . Portal hypertension with esophageal varices (HCC) 10/20/2022  .  Multiple benign nevi 01/27/2022  . Thrombocytopenia 10/24/2015  . Liver cirrhosis (HCC) 10/24/2015  . Autoimmune hepatitis (HCC) 03/28/2013  . Essential hypertension 11/15/2012   Past Medical History:  Diagnosis Date  . Arthritis   . Autoimmune hepatitis (HCC)   . Cirrhosis of liver (HCC)   . Hypertension   . Thrombocytopenia     Family History  Problem Relation Age of Onset  . Hypertension Sister     Past Surgical History:  Procedure Laterality Date  . COLONOSCOPY N/A 02/22/2024   Procedure: COLONOSCOPY;  Surgeon: Albertus Gordy HERO, MD;  Location: [facility];  Service: Gastroenterology;  Laterality: N/A;  . ESOPHAGEAL BANDING  10/20/2022   Procedure: ESOPHAGEAL BANDING;  Surgeon: Albertus Gordy HERO, MD;  Location: [facility]COPY;  Service: Gastroenterology;;  . ESOPHAGEAL BANDING  11/11/2022   Procedure: ESOPHAGEAL BANDING;  Surgeon: Albertus Gordy HERO, MD;  Location: [facility]COPY;  Service: Gastroenterology;;  . ESOPHAGEAL BANDING  N/A 12/07/2022   Procedure: ESOPHAGEAL BANDING;  Surgeon: Abran Norleen SAILOR, MD;  Location: [facility];  Service: Gastroenterology;  Laterality: N/A;  . ESOPHAGOGASTRODUODENOSCOPY N/A 10/20/2022   Procedure: ESOPHAGOGASTRODUODENOSCOPY (EGD);  Surgeon: Albertus Gordy HERO, MD;  Location: [facility] END[facility]COPY;  Service: Gastroenterology;  Laterality: N/A;  per ICU  . ESOPHAGOGASTRODUODENOSCOPY N/A 02/22/2024   Procedure: EGD (ESOPHAGOGASTRODUODENOSCOPY);  Surgeon: Albertus Gordy HERO, MD;  Location: [facility];  Service: Gastroenterology;  Laterality: N/A;  . ESOPHAGOGASTRODUODENOSCOPY (EGD) WITH PROPOFOL  N/A 11/11/2022   Procedure: ESOPHAGOGASTRODUODENOSCOPY (EGD) WITH PROPOFOL ;  Surgeon: Albertus Gordy HERO, MD;  Location: [facility] ENDOSCOPY;  Service: Gastroenterology;  Laterality: N/A;  . ESOPHAGOGASTRODUODENOSCOPY (EGD) WITH PROPOFOL  N/A 12/07/2022   Procedure: ESOPHAGOGASTRODUODENOSCOPY (EGD) WITH PROPOFOL ;  Surgeon: Abran Norleen SAILOR, MD;  Location: [facility];  Service: Gastroenterology;  Laterality: N/A;  . ESOPHAGOGASTRODUODENOSCOPY (EGD) WITH PROPOFOL  N/A 03/01/2023   Procedure: ESOPHAGOGASTRODUODENOSCOPY (EGD) WITH PROPOFOL ;  Surgeon: Albertus Gordy HERO, MD;  Location: [facility];  Service: Gastroenterology;  Laterality: N/A;  . teeth     Social History   Occupational History  . Occupation: retired  Tobacco Use  . Smoking status: Never  . Smokeless tobacco: Never  Vaping Use  . Vaping status: Never Used  Substance and Sexual Activity  . Alcohol use: No    Alcohol/week: 0.0 standard drinks of alcohol  . Drug use: No  . Sexual activity: Yes

## 2024-04-25 ENCOUNTER — Encounter: Payer: Self-pay | Admitting: Physician Assistant

## 2024-05-01 ENCOUNTER — Telehealth: Payer: Self-pay

## 2024-05-01 NOTE — Telephone Encounter (Signed)
 Called pt, no answer, lm on VM to call back and schedule an appointment for his lymphedema pump demo in office. He can schedule this with anyone that answers the phone.

## 2024-05-01 NOTE — Telephone Encounter (Signed)
 Patient needs an appointment for a pump demo in the office.

## 2024-05-01 NOTE — Telephone Encounter (Signed)
 Patient would like a Callback from you. Won't give me more info besides he saw Maurilio Collet on 10/20 and needs to speak with you.  CB [phone]

## 2024-05-02 ENCOUNTER — Ambulatory Visit: Admitting: Physician Assistant

## 2024-05-02 ENCOUNTER — Encounter: Payer: Self-pay | Admitting: Physician Assistant

## 2024-05-02 DIAGNOSIS — I890 Lymphedema, not elsewhere classified: Secondary | ICD-10-CM

## 2024-05-02 NOTE — Progress Notes (Signed)
 Mr. Timothy [name] came in today for demonstration of lymphedema pumps.  No Charge.  He is doing well.  Rep for the pumps is in the room.  Timothy Deland Collet PA-C

## 2024-05-05 DIAGNOSIS — I890 Lymphedema, not elsewhere classified: Secondary | ICD-10-CM | POA: Diagnosis not present

## 2024-05-05 DIAGNOSIS — I872 Venous insufficiency (chronic) (peripheral): Secondary | ICD-10-CM | POA: Diagnosis not present

## 2024-05-08 ENCOUNTER — Encounter: Payer: Self-pay | Admitting: Radiology

## 2024-05-10 ENCOUNTER — Ambulatory Visit: Admitting: Family Medicine

## 2024-05-10 ENCOUNTER — Other Ambulatory Visit: Payer: Self-pay

## 2024-05-22 ENCOUNTER — Other Ambulatory Visit: Payer: Self-pay

## 2024-05-30 ENCOUNTER — Other Ambulatory Visit: Payer: Self-pay

## 2024-05-30 ENCOUNTER — Encounter: Payer: Self-pay | Admitting: Family Medicine

## 2024-05-30 ENCOUNTER — Ambulatory Visit: Attending: Family Medicine | Admitting: Family Medicine

## 2024-05-30 VITALS — BP 121/77 | HR 62 | Temp 98.4°F | Ht 74.0 in | Wt 202.8 lb

## 2024-05-30 DIAGNOSIS — I890 Lymphedema, not elsewhere classified: Secondary | ICD-10-CM | POA: Diagnosis not present

## 2024-05-30 DIAGNOSIS — K7460 Unspecified cirrhosis of liver: Secondary | ICD-10-CM | POA: Diagnosis not present

## 2024-05-30 DIAGNOSIS — J302 Other seasonal allergic rhinitis: Secondary | ICD-10-CM | POA: Diagnosis not present

## 2024-05-30 DIAGNOSIS — K754 Autoimmune hepatitis: Secondary | ICD-10-CM

## 2024-05-30 MED ORDER — MISC. DEVICES MISC: 0 refills | Status: DC

## 2024-05-30 MED ORDER — LEVOCETIRIZINE DIHYDROCHLORIDE 5 MG PO TABS
5.0000 mg | ORAL_TABLET | Freq: Every evening | ORAL | 1 refills | Status: DC
  Filled 2024-05-30: qty 30, 30d supply, fill #0

## 2024-05-30 NOTE — Progress Notes (Signed)
 Subjective:  Patient ID: Timothy [name], male    DOB: 04/06/1961  Age: 63 y.o. MRN: [id number]  CC: Medical Management of Chronic Issues (Allergies/Discuss wheelchair/Discuss handicap sticker/)     Discussed the use of AI scribe software for clinical note transcription with the patient, who gave verbal consent to proceed.  History of Present Illness Timothy [name] is a 63 year old male with  liver cirrhosis (secondary to autoimmune hepatitis and hepatitis C) with portal hypertension and esophageal varices, hypertension  who presents for a follow-up visit regarding mobility assistance and allergy management.  He has chronic lymphedema of both legs with significant swelling and weakness that worsen at certain times of day and limit his mobility. He uses a cane and wheelchair and now has a lymphedema pump. He needs a smaller wheelchair for better maneuverability.He notes persistent leg weakness from lymphedema that requires use of a cane and wheelchair for mobility.  He has worsening allergy symptoms with frequent sneezing. He has been taking an over-the-[name] nasal spray.  He has liver cirrhosis with autoimmune hepatitis and portal hypertension, treated with azathioprine  and carvedilol  twice daily. He stopped spironolactone  after a high potassium level and now takes furosemide  40 mg daily for fluid retention. He monitors his weight closely and aims to stay near 200 lb, with a current weight of 199 lb.  He last saw his liver specialist in 01/2024.     Past Medical History:  Diagnosis Date   Arthritis    Autoimmune hepatitis (HCC)    Cirrhosis of liver (HCC)    Hypertension    Thrombocytopenia     Past Surgical History:  Procedure Laterality Date   COLONOSCOPY N/A 02/22/2024   Procedure: COLONOSCOPY;  Surgeon: Albertus Gordy HERO, MD;  Location: [facility];  Service: Gastroenterology;  Laterality: N/A;   ESOPHAGEAL BANDING  10/20/2022   Procedure: ESOPHAGEAL BANDING;  Surgeon: Albertus Gordy HERO, MD;  Location: [facility] ENDOSCOPY;  Service: Gastroenterology;;   ESOPHAGEAL BANDING  11/11/2022   Procedure: ESOPHAGEAL BANDING;  Surgeon: Albertus Gordy HERO, MD;  Location: [facility] ENDOSCOPY;  Service: Gastroenterology;;   ESOPHAGEAL BANDING N/A 12/07/2022   Procedure: ESOPHAGEAL BANDING;  Surgeon: Abran Timothy SAILOR, MD;  Location: [facility];  Service: Gastroenterology;  Laterality: N/A;   ESOPHAGOGASTRODUODENOSCOPY N/A 10/20/2022   Procedure: ESOPHAGOGASTRODUODENOSCOPY (EGD);  Surgeon: Albertus Gordy HERO, MD;  Location: [facility] ENDOSCOPY;  Service: Gastroenterology;  Laterality: N/A;  per ICU   ESOPHAGOGASTRODUODENOSCOPY N/A 02/22/2024   Procedure: EGD (ESOPHAGOGASTRODUODENOSCOPY);  Surgeon: Albertus Gordy HERO, MD;  Location: [facility];  Service: Gastroenterology;  Laterality: N/A;   ESOPHAGOGASTRODUODENOSCOPY (EGD) WITH PROPOFOL  N/A 11/11/2022   Procedure: ESOPHAGOGASTRODUODENOSCOPY (EGD) WITH PROPOFOL ;  Surgeon: Albertus Gordy HERO, MD;  Location: [facility] ENDOSCOPY;  Service: Gastroenterology;  Laterality: N/A;   ESOPHAGOGASTRODUODENOSCOPY (EGD) WITH PROPOFOL  N/A 12/07/2022   Procedure: ESOPHAGOGASTRODUODENOSCOPY (EGD) WITH PROPOFOL ;  Surgeon: Abran Timothy SAILOR, MD;  Location: [facility];  Service: Gastroenterology;  Laterality: N/A;   ESOPHAGOGASTRODUODENOSCOPY (EGD) WITH PROPOFOL  N/A 03/01/2023   Procedure: ESOPHAGOGASTRODUODENOSCOPY (EGD) WITH PROPOFOL ;  Surgeon: Albertus Gordy HERO, MD;  Location: [facility];  Service: Gastroenterology;  Laterality: N/A;   teeth      Family History  Problem Relation Age of Onset   Hypertension Sister     Social History   Socioeconomic History   Marital status: Single    Spouse name: Not on file   Number of children: 0   Years of education: Not on file   Highest education level: Not on file  Occupational History   Occupation: retired  Tobacco Use   Smoking status: Never   Smokeless tobacco: Never  Vaping Use   Vaping status: Never Used  Substance and Sexual Activity   Alcohol  use: No    Alcohol/week: 0.0 standard drinks of alcohol   Drug use: No   Sexual activity: Yes  Other Topics Concern   Not on file  Social History Narrative   Not on file   Social Drivers of Health   Financial Resource Strain: Not on file  Food Insecurity: Food Insecurity Present (06/15/2023)   Hunger Vital Sign    Worried About Running Out of Food in the Last Year: Never true    Ran Out of Food in the Last Year: Sometimes true  Transportation Needs: No Transportation Needs (06/15/2023)   PRAPARE - Administrator, Civil Service (Medical): No    Lack of Transportation (Non-Medical): No  Physical Activity: Not on file  Stress: Not on file  Social Connections: Unknown (06/15/2023)   Social Connection and Isolation Panel    Frequency of Communication with Friends and Family: Three times a week    Frequency of Social Gatherings with Friends and Family: Three times a week    Attends Religious Services: Not on file    Active Member of Clubs or Organizations: Not on file    Attends Banker Meetings: Not on file    Marital Status: Not on file    Allergies  Allergen Reactions   Nsaids     Patient denies any allergy to NSAIDs (particular Advil ) He takes this medication frequently without any adverse side effects    Amoxicillin  Rash    Outpatient Medications Prior to Visit  Medication Sig Dispense Refill   azaTHIOprine  (IMURAN ) 50 MG tablet Take 2 tablets (100 mg total) by mouth daily. Please note increased dose. 60 tablet 11   Blood Pressure Monitoring (BLOOD PRESSURE KIT) DEVI Use to measure blood pressure 1 each 0   carvedilol  (COREG ) 3.125 MG tablet Take 1 tablet (3.125 mg total) by mouth in the morning and 1 tablet (3.125 mg total) in the evening. Take with meals. 180 tablet 3   pantoprazole  (PROTONIX ) 40 MG tablet Take 1 tablet (40 mg total) by mouth daily before breakfast. 90 tablet 2   furosemide  (LASIX ) 20 MG tablet Take 1 tablet (20 mg total) by mouth  daily. (Patient not taking: Reported on 05/30/2024) 30 tablet 1   spironolactone  (ALDACTONE ) 100 MG tablet Take 1 tablet (100 mg total) by mouth daily. (Patient not taking: Reported on 05/30/2024) 90 tablet 0   No facility-administered medications prior to visit.     ROS Review of Systems  Constitutional:  Negative for activity change and appetite change.  HENT:  Negative for sinus pressure and sore throat.   Respiratory:  Negative for chest tightness, shortness of breath and wheezing.   Cardiovascular:  Positive for leg swelling. Negative for chest pain and palpitations.  Gastrointestinal:  Negative for abdominal distention, abdominal pain and constipation.  Genitourinary: Negative.   Musculoskeletal: Negative.   Psychiatric/Behavioral:  Negative for behavioral problems and dysphoric mood.     Objective:  BP 121/77   Pulse 62   Temp 98.4 F (36.9 C) (Oral)   Ht 6' 2 (1.88 m)   Wt 202 lb 12.8 oz (92 kg)   SpO2 100%   BMI 26.04 kg/m      05/30/2024   10:00 AM 02/22/2024    5:00 PM 02/22/2024  4:50 PM  BP/Weight  Systolic BP 121 127 108  Diastolic BP 77 79 64  Wt. (Lbs) 202.8    BMI 26.04 kg/m2        Physical Exam Constitutional:      Appearance: He is well-developed.  Cardiovascular:     Rate and Rhythm: Normal rate.     Heart sounds: Normal heart sounds. No murmur heard. Pulmonary:     Effort: Pulmonary effort is normal.     Breath sounds: Normal breath sounds. No wheezing or rales.  Chest:     Chest wall: No tenderness.  Abdominal:     General: Bowel sounds are normal. There is no distension.     Palpations: Abdomen is soft. There is no mass.     Tenderness: There is no abdominal tenderness.  Musculoskeletal:        General: Normal range of motion.     Right lower leg: Edema present.     Left lower leg: Edema present.  Neurological:     Mental Status: He is alert and oriented to person, place, and time.     Gait: Gait abnormal (ambulates with a  cane).  Psychiatric:        Mood and Affect: Mood normal.        Latest Ref Rng & Units 01/31/2024   10:05 AM 11/08/2023   12:18 PM 07/15/2023    9:06 AM  CMP  Glucose 70 - 99 mg/dL 94  862  82   BUN 6 - 23 mg/dL 21  29  24    Creatinine 0.40 - 1.50 mg/dL 8.75  8.56  8.71   Sodium 135 - 145 mEq/L 140  136  141   Potassium 3.5 - 5.1 mEq/L 4.3  5.4  4.2   Chloride 96 - 112 mEq/L 111  104  108   CO2 19 - 32 mEq/L 24  22  26    Calcium 8.4 - 10.5 mg/dL 8.7  9.7  9.5   Total Protein 6.0 - 8.5 g/dL  7.0  7.6   Total Bilirubin 0.0 - 1.2 mg/dL  1.0  1.2   Alkaline Phos 44 - 121 IU/L  103  83   AST 0 - 40 IU/L  72  67   ALT 0 - 44 IU/L  80  43     Lipid Panel     Component Value Date/Time   CHOL 159 01/27/2022 0912   TRIG 50 01/27/2022 0912   HDL 62 01/27/2022 0912   CHOLHDL 2.6 01/27/2022 0912   CHOLHDL 2.8 03/29/2015 1111   VLDL 14 03/29/2015 1111   LDLCALC 86 01/27/2022 0912    CBC    Component Value Date/Time   WBC 3.7 11/08/2023 1218   WBC 2.9 (L) 07/15/2023 0906   RBC 4.29 11/08/2023 1218   RBC 4.09 (L) 07/15/2023 0906   HGB 13.0 11/08/2023 1218   HGB 14.5 01/27/2016 1005   HCT 40.1 11/08/2023 1218   HCT 42.1 01/27/2016 1005   PLT 64 (LL) 11/08/2023 1218   MCV 94 11/08/2023 1218   MCV 84.7 01/27/2016 1005   MCH 30.3 11/08/2023 1218   MCH 30.2 10/23/2022 0113   MCHC 32.4 11/08/2023 1218   MCHC 33.0 07/15/2023 0906   RDW 15.2 11/08/2023 1218   RDW 15.6 (H) 01/27/2016 1005   LYMPHSABS 0.7 11/08/2023 1218   LYMPHSABS 1.8 01/27/2016 1005   MONOABS 0.4 07/15/2023 0906   MONOABS 0.5 01/27/2016 1005   EOSABS 0.1 11/08/2023 1218  BASOSABS 0.0 11/08/2023 1218   BASOSABS 0.0 01/27/2016 1005    Lab Results  Component Value Date   HGBA1C 5.6 11/08/2023       Assessment & Plan Lymphedema of bilateral lower extremities Chronic lymphedema causing leg swelling and weakness, necessitating mobility aids. - Completed handicap form for wheelchair. - Prescribed small  wheelchair. - Faxed order to medical equipment company for wheelchair approval.   Autoimmune hepatitis and cirrhosis of liver with portal hypertension Chronic autoimmune hepatitis and cirrhosis with portal hypertension. Managed with azathioprine  and carvedilol . Furosemide  continued for fluid management.  States he no longer needs spironolactone .  Spironolactone  discontinued due to hyperkalemia. - Ordered comprehensive metabolic panel - Continue azathioprine  and carvedilol . - Continue furosemide  40 mg daily which he states he has been taking (even though med list reveals he is on 20 mg  Seasonal allergic rhinitis Chronic seasonal allergic rhinitis with worsening symptoms. - Prescribed Xyzal . - Advised use of nasal spray.       Meds ordered this encounter  Medications   Misc. Devices MISC    Sig: Wheelchair with Accessories: elevating leg rests (ELRs), wheel locks, extensions and anti-tippers. Cane or walker will not suffice Dx: bilateral lymphedema requiring lymphedema pump    Dispense:  1 each    Refill:  0   levocetirizine (XYZAL ) 5 MG tablet    Sig: Take 1 tablet (5 mg total) by mouth every evening.    Dispense:  90 tablet    Refill:  1    Follow-up: Return in about 6 months (around 11/27/2024) for Chronic medical conditions.       Corrina Sabin, MD, FAAFP. [facility] [place], [place] [phone]   05/30/2024, 1:13 PM

## 2024-05-30 NOTE — Patient Instructions (Signed)
 VISIT SUMMARY:  You came in today for a follow-up visit to discuss your mobility assistance and allergy management. We reviewed your chronic lymphedema, osteoarthritis, autoimmune hepatitis, and seasonal allergies.  YOUR PLAN:  -LYMPHEDEMA OF BILATERAL LOWER EXTREMITIES: Lymphedema is a condition where excess fluid collects in tissues causing swelling, often in the legs. We completed a handicap form and prescribed a smaller wheelchair for better maneuverability. The order has been faxed to the medical equipment company for approval.  -OSTEOARTHRITIS OF BILATERAL ANKLES WITH ASSOCIATED FOOT SWELLING AND KNEE PAIN: Osteoarthritis is a type of arthritis that occurs when flexible tissue at the ends of bones wears down. This is causing pain in your ankles and knees, contributing to your mobility issues.  -AUTOIMMUNE HEPATITIS AND CIRRHOSIS OF LIVER WITH PORTAL HYPERTENSION: Autoimmune hepatitis is a liver inflammation that occurs when your body's immune system attacks liver cells. Cirrhosis is severe scarring of the liver, and portal hypertension is increased blood pressure within the portal vein. Continue taking azathioprine  and carvedilol  as prescribed. We have ordered blood tests to check your potassium, liver, and kidney function. Continue taking furosemide  40 mg daily for fluid management.  -SEASONAL ALLERGIC RHINITIS: Seasonal allergic rhinitis is an allergic reaction to pollen that causes sneezing, congestion, and a runny nose. We have prescribed Xyzal  and advised you to use a nasal spray to manage your symptoms.  INSTRUCTIONS:  Please follow up with your liver specialist as soon as possible, as it has been six to seven months since your last visit. Continue monitoring your weight closely and aim to stay near 200 lb. We will review your blood test results once they are available.

## 2024-05-31 ENCOUNTER — Ambulatory Visit: Payer: Self-pay | Admitting: Family Medicine

## 2024-05-31 LAB — CMP14+EGFR
ALT: 19 IU/L (ref 0–44)
AST: 44 IU/L — ABNORMAL HIGH (ref 0–40)
Albumin: 2.9 g/dL — ABNORMAL LOW (ref 3.9–4.9)
Alkaline Phosphatase: 58 IU/L (ref 47–123)
BUN/Creatinine Ratio: 10 (ref 10–24)
BUN: 12 mg/dL (ref 8–27)
Bilirubin Total: 1.2 mg/dL (ref 0.0–1.2)
CO2: 23 mmol/L (ref 20–29)
Calcium: 8.6 mg/dL (ref 8.6–10.2)
Chloride: 111 mmol/L — ABNORMAL HIGH (ref 96–106)
Creatinine, Ser: 1.26 mg/dL (ref 0.76–1.27)
Globulin, Total: 3.3 g/dL (ref 1.5–4.5)
Glucose: 121 mg/dL — ABNORMAL HIGH (ref 70–99)
Potassium: 4.6 mmol/L (ref 3.5–5.2)
Sodium: 140 mmol/L (ref 134–144)
Total Protein: 6.2 g/dL (ref 6.0–8.5)
eGFR: 64 mL/min/1.73 (ref >59)

## 2024-06-08 ENCOUNTER — Other Ambulatory Visit: Payer: Self-pay

## 2024-06-09 ENCOUNTER — Other Ambulatory Visit: Payer: Self-pay

## 2024-06-09 MED ORDER — MISC. DEVICES MISC: 0 refills | Status: DC

## 2024-06-14 ENCOUNTER — Telehealth: Payer: Self-pay

## 2024-06-14 NOTE — Telephone Encounter (Signed)
 Patient originally requested a wheelchair but now patient is wanting a Rolator instead.

## 2024-06-15 ENCOUNTER — Other Ambulatory Visit: Payer: Self-pay

## 2024-06-15 MED ORDER — MISC. DEVICES MISC: 0 refills | Status: DC

## 2024-06-15 MED ORDER — MISC. DEVICES MISC: 0 refills | Status: AC

## 2024-06-15 NOTE — Telephone Encounter (Signed)
 Noted

## 2024-06-15 NOTE — Telephone Encounter (Signed)
 Prescription for walker has been written.

## 2024-06-15 NOTE — Addendum Note (Signed)
 Addended by: Cheo Selvey on: 06/15/2024 08:31 AM   Modules accepted: Orders

## 2024-06-16 ENCOUNTER — Telehealth: Payer: Self-pay | Admitting: Internal Medicine

## 2024-06-16 DIAGNOSIS — K7290 Hepatic failure, unspecified without coma: Secondary | ICD-10-CM

## 2024-06-16 DIAGNOSIS — K754 Autoimmune hepatitis: Secondary | ICD-10-CM

## 2024-06-16 NOTE — Telephone Encounter (Signed)
 Inbound call from patient requesting to speak to Dr. Albertus nurse in regards to his liver. Patient stated that he called our office yesterday and left a message. I did not see any notation in the patient chart. Patient is requesting for the nurse to possibly reach back out to him today. Please advise.

## 2024-06-16 NOTE — Telephone Encounter (Signed)
 Left message for pt to call back

## 2024-06-20 NOTE — Telephone Encounter (Signed)
 Inbound call from patient requesting to speak to the nurse. Patient stated he has missed several calls. Patient is requesting a call back. Please advise.

## 2024-06-20 NOTE — Telephone Encounter (Signed)
 Spoke with patient who states that he was told to call Dr Albertus if he was having any problems with his liver and to stay under 200 pounds for optimal liver health. Patient states that he recently went to his PCP who told him he needed more protein intake. States that he took a high protein Ensure and since then has had bloating and distention of his abdomen. States that he does feel there is some fluid buildup. Denies any difficulty with SOB but states he is having some difficulty when he bends over to put his shoes on. He does have some lower extremity swelling but this is attributed to lymphedema which is not new. Patient is taking furosemide  40 mg daily and azathioprine  but is no longer taking spironolactone  because of isolated elevated potassium level not long ago.   Patient is due for office visit January. Any recommendations until visit is scheduled?

## 2024-06-20 NOTE — Telephone Encounter (Signed)
 Patient has been scheduled for an abdominal ultrasound at [facility] on 06/27/24 at 10:15 am, 10 am arrival. NPO after midnight.  Lab orders have been placed in EPIC.   Patient is advised of ultrasound time/date/location/prep and need for labs at [facility] lab. He verbalizes understanding and indicates he will come for these appointments. Will likely come for labwork tomorrow.  Patient is reminded to follow a low sodium diet of 2000 mg or less daily. High protein is fine but should continue to restrict sodium to prevent fluid retention. Discussed that patient should continue same furosemide  dosing for now but we may possibly add diuretic medication back depending on test results. He also verbalizes understanding of this information.  Patient is scheduled for follow up with Ellouise Console, PA-C on 07/27/24 and he is encouraged to keep this appointment as well.

## 2024-06-20 NOTE — Telephone Encounter (Signed)
 I recommend CBC, CMP, INR and a complete abdominal ultrasound Low-sodium diet Continue current doses of furosemide  but may need to add back additional diuretic depending on lab results and ultrasound results Okay for high-protein diet as long as he restricts sodium to 2000 mg daily

## 2024-06-21 ENCOUNTER — Telehealth: Payer: Self-pay | Admitting: Family Medicine

## 2024-06-21 ENCOUNTER — Other Ambulatory Visit: Payer: Self-pay

## 2024-06-21 ENCOUNTER — Other Ambulatory Visit: Payer: Self-pay | Admitting: Family Medicine

## 2024-06-21 ENCOUNTER — Other Ambulatory Visit: Payer: Self-pay | Admitting: Internal Medicine

## 2024-06-21 MED ORDER — SPIRONOLACTONE 25 MG PO TABS
25.0000 mg | ORAL_TABLET | Freq: Every day | ORAL | 1 refills | Status: DC
  Filled 2024-06-21 – 2024-06-22 (×2): qty 90, 90d supply, fill #0

## 2024-06-21 MED ORDER — SPIRONOLACTONE 25 MG PO TABS
50.0000 mg | ORAL_TABLET | Freq: Every day | ORAL | 1 refills | Status: DC
  Filled 2024-06-21: qty 90, 45d supply, fill #0

## 2024-06-21 MED ORDER — FUROSEMIDE 20 MG PO TABS
20.0000 mg | ORAL_TABLET | Freq: Every day | ORAL | 1 refills | Status: DC
  Filled 2024-06-21: qty 30, 30d supply, fill #0

## 2024-06-21 NOTE — Telephone Encounter (Signed)
 Responded to patient via MyChart.

## 2024-06-21 NOTE — Telephone Encounter (Signed)
 Patient is requesting spirolactone medication, he was given a refill for lasix  today by his GI doctor. Does patient need to be on both medication for swelling.

## 2024-06-21 NOTE — Telephone Encounter (Signed)
 spironolactone  (ALDACTONE ) 100 MG tablet   requesting refill

## 2024-06-22 ENCOUNTER — Other Ambulatory Visit: Payer: Self-pay

## 2024-06-23 ENCOUNTER — Telehealth: Payer: Self-pay

## 2024-06-23 ENCOUNTER — Other Ambulatory Visit: Payer: Self-pay

## 2024-06-23 ENCOUNTER — Other Ambulatory Visit (INDEPENDENT_AMBULATORY_CARE_PROVIDER_SITE_OTHER)

## 2024-06-23 ENCOUNTER — Ambulatory Visit: Payer: Self-pay | Admitting: Internal Medicine

## 2024-06-23 DIAGNOSIS — R600 Localized edema: Secondary | ICD-10-CM

## 2024-06-23 DIAGNOSIS — K7290 Hepatic failure, unspecified without coma: Secondary | ICD-10-CM | POA: Diagnosis not present

## 2024-06-23 DIAGNOSIS — K754 Autoimmune hepatitis: Secondary | ICD-10-CM

## 2024-06-23 DIAGNOSIS — K7460 Unspecified cirrhosis of liver: Secondary | ICD-10-CM

## 2024-06-23 LAB — CBC WITH DIFFERENTIAL/PLATELET
Basophils Absolute: 0 K/uL (ref 0.0–0.1)
Basophils Relative: 1.2 % (ref 0.0–3.0)
Eosinophils Absolute: 0.2 K/uL (ref 0.0–0.7)
Eosinophils Relative: 10.1 % — ABNORMAL HIGH (ref 0.0–5.0)
HCT: 30.6 % — ABNORMAL LOW (ref 39.0–52.0)
Hemoglobin: 10.5 g/dL — ABNORMAL LOW (ref 13.0–17.0)
Lymphocytes Relative: 20.8 % (ref 12.0–46.0)
Lymphs Abs: 0.3 K/uL — ABNORMAL LOW (ref 0.7–4.0)
MCHC: 34.4 g/dL (ref 30.0–36.0)
MCV: 101.9 fl — ABNORMAL HIGH (ref 78.0–100.0)
Monocytes Absolute: 0.2 K/uL (ref 0.1–1.0)
Monocytes Relative: 11 % (ref 3.0–12.0)
Neutro Abs: 0.9 K/uL — ABNORMAL LOW (ref 1.4–7.7)
Neutrophils Relative %: 56.9 % (ref 43.0–77.0)
Platelets: 61 K/uL — ABNORMAL LOW (ref 150.0–400.0)
RBC: 3.01 Mil/uL — ABNORMAL LOW (ref 4.22–5.81)
RDW: 16.2 % — ABNORMAL HIGH (ref 11.5–15.5)
WBC: 1.6 K/uL — CL (ref 4.0–10.5)

## 2024-06-23 LAB — COMPREHENSIVE METABOLIC PANEL WITH GFR
ALT: 13 U/L (ref 3–53)
AST: 37 U/L (ref 5–37)
Albumin: 2.8 g/dL — ABNORMAL LOW (ref 3.5–5.2)
Alkaline Phosphatase: 62 U/L (ref 39–117)
BUN: 14 mg/dL (ref 6–23)
CO2: 27 meq/L (ref 19–32)
Calcium: 8.2 mg/dL — ABNORMAL LOW (ref 8.4–10.5)
Chloride: 109 meq/L (ref 96–112)
Creatinine, Ser: 1.18 mg/dL (ref 0.40–1.50)
GFR: 65.27 mL/min
Glucose, Bld: 142 mg/dL — ABNORMAL HIGH (ref 70–99)
Potassium: 3.7 meq/L (ref 3.5–5.1)
Sodium: 140 meq/L (ref 135–145)
Total Bilirubin: 1.3 mg/dL — ABNORMAL HIGH (ref 0.2–1.2)
Total Protein: 6.4 g/dL (ref 6.0–8.3)

## 2024-06-23 LAB — PROTIME-INR
INR: 1.5 ratio — ABNORMAL HIGH (ref 0.8–1.0)
Prothrombin Time: 15.4 s — ABNORMAL HIGH (ref 9.6–13.1)

## 2024-06-23 MED ORDER — SPIRONOLACTONE 100 MG PO TABS
100.0000 mg | ORAL_TABLET | Freq: Every day | ORAL | 3 refills | Status: AC
  Filled 2024-06-23: qty 30, 30d supply, fill #0

## 2024-06-23 MED ORDER — FUROSEMIDE 40 MG PO TABS
40.0000 mg | ORAL_TABLET | Freq: Every day | ORAL | 3 refills | Status: AC
  Filled 2024-06-23: qty 30, 30d supply, fill #0
  Filled 2024-07-24 (×2): qty 30, 30d supply, fill #1

## 2024-06-23 NOTE — Telephone Encounter (Signed)
 Patient called stating that his feet and ankles are swollen and would like a Rx for some shoes.  CB# [phone].  Please advise.  Thank you.

## 2024-06-26 ENCOUNTER — Telehealth: Payer: Self-pay | Admitting: Internal Medicine

## 2024-06-26 ENCOUNTER — Other Ambulatory Visit: Payer: Self-pay

## 2024-06-26 ENCOUNTER — Telehealth: Payer: Self-pay | Admitting: Family Medicine

## 2024-06-26 ENCOUNTER — Other Ambulatory Visit

## 2024-06-26 DIAGNOSIS — K7460 Unspecified cirrhosis of liver: Secondary | ICD-10-CM

## 2024-06-26 DIAGNOSIS — K754 Autoimmune hepatitis: Secondary | ICD-10-CM

## 2024-06-26 DIAGNOSIS — R600 Localized edema: Secondary | ICD-10-CM

## 2024-06-26 NOTE — Telephone Encounter (Signed)
 MyChart message sent to patient.

## 2024-06-26 NOTE — Telephone Encounter (Signed)
 Spoke to patient. We discussed recent low WBC and hemoglobin. Discussed that this has been a problem for him previously, but we would like to make sure that azathioprine  (which he takes for autoimmune hepatitis) is not the cause of these fluctuations since a side effect of azathioprine  can be leukopenia and anemia. Patient did come for his TPMT testing earlier today and this is in process. Advised we can give him some more guidance following that lab test. Also reiterated that he should restart spironolactone  100 mg and furosemide  40 mg daily and should have BMP on this Friday. Patient verbalizes understanding and will also keep scheduled ultrasound appointment for 06/27/24.

## 2024-06-26 NOTE — Telephone Encounter (Signed)
 Inbound call from patient stating he would like to speak to nurse in regards to his results, patient wants to know what he's low in, patient also stated he has blood work today at [facility]. Please advise  Thank you

## 2024-06-26 NOTE — Addendum Note (Signed)
 Addended by: CLAUDENE NAOMIE SAILOR on: 06/26/2024 03:24 PM   Modules accepted: Orders

## 2024-06-26 NOTE — Telephone Encounter (Signed)
 error

## 2024-06-27 ENCOUNTER — Ambulatory Visit (HOSPITAL_COMMUNITY)
Admission: RE | Admit: 2024-06-27 | Discharge: 2024-06-27 | Disposition: A | Discharge status: Home / Self Care | Source: Ambulatory Visit | Attending: Internal Medicine | Admitting: Internal Medicine

## 2024-06-27 DIAGNOSIS — K754 Autoimmune hepatitis: Secondary | ICD-10-CM | POA: Diagnosis present

## 2024-06-27 DIAGNOSIS — K7290 Hepatic failure, unspecified without coma: Secondary | ICD-10-CM | POA: Diagnosis present

## 2024-06-27 DIAGNOSIS — K7460 Unspecified cirrhosis of liver: Secondary | ICD-10-CM | POA: Diagnosis present

## 2024-06-28 ENCOUNTER — Other Ambulatory Visit: Payer: Self-pay

## 2024-06-28 DIAGNOSIS — K7460 Unspecified cirrhosis of liver: Secondary | ICD-10-CM

## 2024-06-28 LAB — THIOPURINE METHYLTRANSFERASE (TPMT), RBC

## 2024-06-28 LAB — EXTRA SPECIMEN

## 2024-06-30 ENCOUNTER — Other Ambulatory Visit: Payer: Self-pay

## 2024-06-30 ENCOUNTER — Other Ambulatory Visit (INDEPENDENT_AMBULATORY_CARE_PROVIDER_SITE_OTHER)

## 2024-06-30 DIAGNOSIS — K754 Autoimmune hepatitis: Secondary | ICD-10-CM | POA: Diagnosis not present

## 2024-06-30 DIAGNOSIS — K7290 Hepatic failure, unspecified without coma: Secondary | ICD-10-CM

## 2024-06-30 DIAGNOSIS — K7460 Unspecified cirrhosis of liver: Secondary | ICD-10-CM | POA: Diagnosis not present

## 2024-06-30 LAB — BASIC METABOLIC PANEL WITH GFR
BUN: 12 mg/dL (ref 6–23)
CO2: 27 meq/L (ref 19–32)
Calcium: 8.1 mg/dL — ABNORMAL LOW (ref 8.4–10.5)
Chloride: 109 meq/L (ref 96–112)
Creatinine, Ser: 1.15 mg/dL (ref 0.40–1.50)
GFR: 67.31 mL/min
Glucose, Bld: 88 mg/dL (ref 70–99)
Potassium: 4.1 meq/L (ref 3.5–5.1)
Sodium: 141 meq/L (ref 135–145)

## 2024-07-03 ENCOUNTER — Ambulatory Visit: Payer: Self-pay | Admitting: Internal Medicine

## 2024-07-03 DIAGNOSIS — K754 Autoimmune hepatitis: Secondary | ICD-10-CM

## 2024-07-03 NOTE — Telephone Encounter (Signed)
 Appt scheduled for patient

## 2024-07-05 ENCOUNTER — Encounter: Payer: Self-pay | Admitting: Physician Assistant

## 2024-07-05 ENCOUNTER — Ambulatory Visit (INDEPENDENT_AMBULATORY_CARE_PROVIDER_SITE_OTHER): Admitting: Physician Assistant

## 2024-07-05 DIAGNOSIS — I872 Venous insufficiency (chronic) (peripheral): Secondary | ICD-10-CM

## 2024-07-05 DIAGNOSIS — I890 Lymphedema, not elsewhere classified: Secondary | ICD-10-CM | POA: Diagnosis not present

## 2024-07-05 NOTE — Progress Notes (Signed)
 "  Office Visit Note   Patient: Timothy [name]           Date of Birth: July 31, 1960           MRN: [id number] Visit Date: 07/05/2024              Requested by: Delbert Clam, MD [address] [place],  [place] [address] PCP: Delbert Clam, MD  Chief Complaint  Patient presents with   Left Leg - Edema   Right Leg - Edema      HPI: 63 y/o male with chronic Lymphedema since our first visit on 02/28/24 he had swelling in B LE and feet for 3 months prior to the first visit.  He is now having increased edema in Bilateral LE he can't get the compression socks on and has to wear bedroom slippers due to edema.  He does state that the swelling decreases over night.  He is able to sleep in a bed.    He has a history of liver cirrhosis with ascites.  He is on Lasix  daily 40 mg, 100 Spironolactone ,  and low -sodium diet.  He is followed by Dr. Albertus Gastroenterologist.  Pending further work up recommended CBC, CMP, INR and a complete abdominal ultrasound.    Assessment & Plan: Visit Diagnoses: No diagnosis found.  Plan: Flexitouch pumps are being fitted today.  With the combination of fluid pills and the pumps we hope to get homoiostasis with the lymphedema.  Elevation during the day can help   Follow-Up Instructions: No follow-ups on file.   Ortho Exam  Patient is alert, oriented, no adenopathy, well-dressed, normal affect, normal respiratory effort. Bilateral LE edema into the dorsum of the feet left > right LE.  No cellulitis.  No open wound.  No weeping.   Imaging: No results found. No images are attached to the encounter.  Labs: Lab Results  Component Value Date   HGBA1C 5.6 11/08/2023   HGBA1C 5.9 (H) 06/11/2020   HGBA1C 6.0 (H) 10/31/2019   LABURIC 5.5 11/08/2023   LABURIC 6.5 04/20/2017     Lab Results  Component Value Date   ALBUMIN 2.8 (L) 06/23/2024   ALBUMIN 2.9 (L) 05/30/2024   ALBUMIN 3.6 (L) 11/08/2023    Lab Results  Component Value Date    MG 2.3 10/21/2022   MG 1.6 (L) 10/20/2022   MG 1.7 10/20/2022   Lab Results  Component Value Date   VD25OH 34.8 11/25/2016   VD25OH 22 (L) 11/15/2012    No results found for: PREALBUMIN    Latest Ref Rng & Units 06/23/2024    9:23 AM 11/08/2023   12:18 PM 07/15/2023    9:06 AM  CBC EXTENDED  WBC 4.0 - 10.5 K/uL 1.6 Repeated and verified X2.  3.7  2.9   RBC 4.22 - 5.81 Mil/uL 3.01  4.29  4.09   Hemoglobin 13.0 - 17.0 g/dL 89.4  86.9  87.2   HCT 39.0 - 52.0 % 30.6  40.1  38.4   Platelets 150.0 - 400.0 K/uL 61.0  64  62.0   NEUT# 1.4 - 7.7 K/uL 0.9  2.3  1.5   Lymph# 0.7 - 4.0 K/uL 0.3  0.7  0.7      There is no height or weight on file to calculate BMI.  Orders:  No orders of the defined types were placed in this encounter.  No orders of the defined types were placed in this encounter.  Procedures: No procedures performed  Clinical Data: No additional findings.  ROS:  All other systems negative, except as noted in the HPI. Review of Systems  Objective: Vital Signs: There were no vitals taken for this visit.  Specialty Comments:  No specialty comments available.  PMFS History: Patient Active Problem List   Diagnosis Date Noted   Secondary esophageal varices without bleeding (HCC) 02/22/2024   Gastric nodule 02/22/2024   Duodenum ulcer 02/22/2024   Colon cancer screening 02/22/2024   Benign neoplasm of transverse colon 02/22/2024   History of hepatitis C 01/05/2023   Esophageal varices with bleeding in diseases classified elsewhere 10/20/2022   Portal hypertension with esophageal varices (HCC) 10/20/2022   Multiple benign nevi 01/27/2022   Thrombocytopenia 10/24/2015   Liver cirrhosis (HCC) 10/24/2015   Autoimmune hepatitis (HCC) 03/28/2013   Essential hypertension 11/15/2012   Past Medical History:  Diagnosis Date   Arthritis    Autoimmune hepatitis (HCC)    Cirrhosis of liver (HCC)    Hypertension    Thrombocytopenia     Family History   Problem Relation Age of Onset   Hypertension Sister     Past Surgical History:  Procedure Laterality Date   COLONOSCOPY N/A 02/22/2024   Procedure: COLONOSCOPY;  Surgeon: Albertus Gordy HERO, MD;  Location: [facility] ENDOSCOPY;  Service: Gastroenterology;  Laterality: N/A;   ESOPHAGEAL BANDING  10/20/2022   Procedure: ESOPHAGEAL BANDING;  Surgeon: Albertus Gordy HERO, MD;  Location: [facility] ENDOSCOPY;  Service: Gastroenterology;;   ESOPHAGEAL BANDING  11/11/2022   Procedure: ESOPHAGEAL BANDING;  Surgeon: Albertus Gordy HERO, MD;  Location: [facility] ENDOSCOPY;  Service: Gastroenterology;;   ESOPHAGEAL BANDING N/A 12/07/2022   Procedure: ESOPHAGEAL BANDING;  Surgeon: Abran Norleen SAILOR, MD;  Location: [facility] ENDOSCOPY;  Service: Gastroenterology;  Laterality: N/A;   ESOPHAGOGASTRODUODENOSCOPY N/A 10/20/2022   Procedure: ESOPHAGOGASTRODUODENOSCOPY (EGD);  Surgeon: Albertus Gordy HERO, MD;  Location[facility] ENDOSCOPY;  Service: Gastroenterology;  Laterality: N/A;  per ICU   ESOPHAGOGASTRODUODENOSCOPY N/A 02/22/2024   Procedure: EGD (ESOPHAGOGASTRODUODENOSCOPY);  Surgeon: Albertus Gordy HERO, MD;  Location: [facility] ENDOSCOPY;  Service: Gastroenterology;  Laterality: N/A;   ESOPHAGOGASTRODUODENOSCOPY (EGD) WITH PROPOFOL  N/A 11/11/2022   Procedure: ESOPHAGOGASTRODUODENOSCOPY (EGD) WITH PROPOFOL ;  Surgeon: Albertus Gordy HERO, MD;  Location: [facility] ENDOSCOPY;  Service: Gastroenterology;  Laterality: N/A;   ESOPHAGOGASTRODUODENOSCOPY (EGD) WITH PROPOFOL  N/A 12/07/2022   Procedure: ESOPHAGOGASTRODUODENOSCOPY (EGD) WITH PROPOFOL ;  Surgeon: Abran Norleen SAILOR, MD;  Location: [facility] ENDOSCOPY;  Service: Gastroenterology;  Laterality: N/A;   ESOPHAGOGASTRODUODENOSCOPY (EGD) WITH PROPOFOL  N/A 03/01/2023   Procedure: ESOPHAGOGASTRODUODENOSCOPY (EGD) WITH PROPOFOL ;  Surgeon: Albertus Gordy HERO, MD;  Location: [facility] ENDOSCOPY;  Service: Gastroenterology;  Laterality: N/A;   teeth     Social History   Occupational History   Occupation: retired  Tobacco Use   Smoking status: Never   Smokeless  tobacco: Never  Vaping Use   Vaping status: Never Used  Substance and Sexual Activity   Alcohol use: No    Alcohol/week: 0.0 standard drinks of alcohol   Drug use: No   Sexual activity: Yes       "

## 2024-07-10 ENCOUNTER — Other Ambulatory Visit (HOSPITAL_COMMUNITY): Payer: Self-pay

## 2024-07-10 ENCOUNTER — Other Ambulatory Visit: Payer: Self-pay

## 2024-07-10 ENCOUNTER — Other Ambulatory Visit (INDEPENDENT_AMBULATORY_CARE_PROVIDER_SITE_OTHER)

## 2024-07-10 DIAGNOSIS — K754 Autoimmune hepatitis: Secondary | ICD-10-CM

## 2024-07-10 NOTE — Telephone Encounter (Signed)
 Patient called stating he received test results through MyChart and would like to speak further. Please advise, thank you

## 2024-07-11 ENCOUNTER — Other Ambulatory Visit (HOSPITAL_COMMUNITY): Payer: Self-pay

## 2024-07-14 LAB — THIOPURINE METABOLITES
6 MMP(6-Methylmercaptopurine): 1022 pmol/8x10(8)RBC
6 TG(6-Thioguanine): 424 pmol/8x10(8)RBC — ABNORMAL HIGH (ref 235–400)

## 2024-07-18 ENCOUNTER — Ambulatory Visit: Payer: Self-pay | Admitting: Internal Medicine

## 2024-07-18 ENCOUNTER — Other Ambulatory Visit: Payer: Self-pay

## 2024-07-23 NOTE — Progress Notes (Unsigned)
 "     Ellouise Console, PA-C [address] [place], [place]  [address] Phone: [phone]   Primary Care Physician: Delbert Clam, MD  Primary Gastroenterologist:  Ellouise Console, PA-C / Dr. Gordy Starch   Chief Complaint: Follow-up autoimmune hepatitis cirrhosis       HPI:   Discussed the use of AI scribe software for clinical note transcription with the patient, who gave verbal consent to proceed.  Patient recently saw [facility] hepatologist Dr. 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  Currently on azathioprine  75 mg daily, furosemide  40 mg daily, and spironolactone  100mg  (Half Tablet) daily.  Ascites and extremity edema improved dramatically on diuretics.  Dr. Zamor lowered patient's dose of azathioprine  from 100 mg to 75 mg daily given his leukopenia.  Follow-up in 6 months with Dr. Zamor is planned.  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  He has undergone surveillance banding with Dr. Starch.  Maintained on carvedilol .  MELD score 14 in December 2025.  Low WBC.  Mild anemia.  06/2024 complete abdominal ultrasound: Multiple gallstones, CBD 6 mm.  Cirrhosis.  No liver lesions.  17-month repeat (June 2026) to screen for hepatoma.  02/22/2024 last EGD by Dr. Starch: Scarring in the lower third esophagus from previous variceal banding.  No residual varices.  Gastritis.  Nonbleeding duodenal ulcers.  Biopsy negative for H. pylori, metaplasia, or dysplasia.  Repeat EGD in 1 year (due 02/2025).  02/22/2024 last screening colonoscopy by Dr. Starch: 1 small 7 mm tubular adenoma polyp removed.  Mild portal colopathy in the entire colon.  Medium internal hemorrhoids.  7-year repeat (due 02/2031).  He last saw Dr. Starch 01/2024 for f/u OV.  Here for follow-up of autoimmune hepatitis cirrhosis with prior esophageal varices status post EVL, ascites, nonocclusive portal vein thrombosis   Current symptoms: None.  He denies any abdominal swelling,  extremity Dema, melena, hematochezia, nausea, vomiting, or confusion.  He is adhering to a low-sodium diet.  He is feeling a lot better.  He is here today with his wife.    Current Outpatient Medications  Medication Sig Dispense Refill   azaTHIOprine  (IMURAN ) 50 MG tablet Take 2 tablets (100 mg total) by mouth daily. Please note increased dose. 60 tablet 11   Blood Pressure Monitoring (BLOOD PRESSURE KIT) DEVI Use to measure blood pressure 1 each 0   carvedilol  (COREG ) 3.125 MG tablet Take 1 tablet (3.125 mg total) by mouth in the morning and 1 tablet (3.125 mg total) in the evening. Take with meals. 180 tablet 3   furosemide  (LASIX ) 40 MG tablet Take 1 tablet (40 mg total) by mouth daily. 30 tablet 3   Misc. Devices MISC Rolling walker with seat. Dx: lower extremity weakness 1 each 0   pantoprazole  (PROTONIX ) 40 MG tablet Take 1 tablet (40 mg total) by mouth daily before breakfast. 90 tablet 2   spironolactone  (ALDACTONE ) 100 MG tablet Take 1 tablet (100 mg total) by mouth daily. 30 tablet 3   No current facility-administered medications for this visit.    Allergies as of 07/27/2024 - Review Complete 07/27/2024  Allergen Reaction Noted   Nsaids  04/27/2022   Amoxicillin  Rash 11/08/2023    Past Medical History:  Diagnosis Date   Arthritis    Autoimmune hepatitis (HCC)    Cirrhosis of liver (HCC)    Hypertension    Thrombocytopenia     Past Surgical History:  Procedure Laterality Date   COLONOSCOPY N/A 02/22/2024   Procedure:  COLONOSCOPY;  Surgeon: Albertus Gordy HERO, MD;  Location: [facility];  Service: Gastroenterology;  Laterality: N/A;   ESOPHAGEAL BANDING  10/20/2022   Procedure: ESOPHAGEAL BANDING;  Surgeon: Albertus Gordy HERO, MD;  Location[facility] ENDOSCOPY;  Service: Gastroenterology;;   ESOPHAGEAL BANDING  11/11/2022   Procedure: ESOPHAGEAL BANDING;  Surgeon: Albertus Gordy HERO, MD;  Location: [facility] ENDOSCOPY;  Service: Gastroenterology;;   ESOPHAGEAL BANDING N/A 12/07/2022   Procedure:  ESOPHAGEAL BANDING;  Surgeon: Abran Norleen SAILOR, MD;  Location: [facility]OS[facility];  Service: Gastroenterology;  Laterality: N/A;   ESOPHAGOGASTRODUODENOSCOPY N/A 10/20/2022   Procedure: ESOPHAGOGASTRODUODENOSCOPY (EGD);  Surgeon: Albertus Gordy HERO, MD;  Location: [facility] ENDOSCOPY;  Service: Gastroenterology;  Laterality: N/A;  per ICU   ESOPHAGOGASTRODUODENOSCOPY N/A 02/22/2024   Procedure: EGD (ESOPHAGOGASTRODUODENOSCOPY);  Surgeon: Albertus Gordy HERO, MD;  Location: [facility];  Service: Gastroenterology;  Laterality: N/A;   ESOPHAGOGASTRODUODENOSCOPY (EGD) WITH PROPOFOL  N/A 11/11/2022   Procedure: ESOPHAGOGASTRODUODENOSCOPY (EGD) WITH PROPOFOL ;  Surgeon: Albertus Gordy HERO, MD;  Location: [facility] ENDOSCOPY;  Service: Gastroenterology;  Laterality: N/A;   ESOPHAGOGASTRODUODENOSCOPY (EGD) WITH PROPOFOL  N/A 12/07/2022   Procedure: ESOPHAGOGASTRODUODENOSCOPY (EGD) WITH PROPOFOL ;  Surgeon: Abran Norleen SAILOR, MD;  Location: [facility] ENDOSCOPY;  Service: Gastroenterology;  Laterality: N/A;   ESOPHAGOGASTRODUODENOSCOPY (EGD) WITH PROPOFOL  N/A 03/01/2023   Procedure: ESOPHAGOGASTRODUODENOSCOPY (EGD) WITH PROPOFOL ;  Surgeon: Albertus Gordy HERO, MD;  Location: [facility] ENDOSCOPY;  Service: Gastroenterology;  Laterality: N/A;   teeth      Review of Systems:    All systems reviewed and negative except where noted in HPI.    Physical Exam:  BP 102/60   Pulse 66   Ht 6' 1.5 (1.867 m)   Wt 187 lb (84.8 kg)   BMI 24.34 kg/m  No LMP for male patient.  General: Well-nourished, well-developed in no acute distress.  Lungs: Clear to auscultation bilaterally. Non-labored. Heart: Regular rate and rhythm, no murmurs rubs or gallops.  Abdomen: Bowel sounds are normal; Abdomen is Soft; No hepatosplenomegaly, masses or hernias;  No Abdominal Tenderness; No guarding or rebound tenderness.  No ascites. Neuro: Alert and oriented x 3.  Grossly intact.  No confusion.  He answers questions appropriately. Psych: Alert and cooperative, normal mood and  affect. Extremities: No lower extremity edema.   Imaging Studies: No results found.   Labs: CBC    Component Value Date/Time   WBC 1.4 Repeated and verified X2. (LL) 07/27/2024 1114   RBC 3.10 (L) 07/27/2024 1114   HGB 10.7 (L) 07/27/2024 1114   HGB 13.0 11/08/2023 1218   HGB 14.5 01/27/2016 1005   HCT 30.9 (L) 07/27/2024 1114   HCT 40.1 11/08/2023 1218   HCT 42.1 01/27/2016 1005   PLT 43.0 Repeated and verified X2. (LL) 07/27/2024 1114   PLT 64 (LL) 11/08/2023 1218   MCV 99.9 07/27/2024 1114   MCV 94 11/08/2023 1218   MCV 84.7 01/27/2016 1005   MCH 30.3 11/08/2023 1218   MCH 30.2 10/23/2022 0113   MCHC 34.7 07/27/2024 1114   RDW 17.3 (H) 07/27/2024 1114   RDW 15.2 11/08/2023 1218   RDW 15.6 (H) 01/27/2016 1005   LYMPHSABS 0.4 (L) 07/27/2024 1114   LYMPHSABS 0.7 11/08/2023 1218   LYMPHSABS 1.8 01/27/2016 1005   MONOABS 0.2 07/27/2024 1114   MONOABS 0.5 01/27/2016 1005   EOSABS 0.1 07/27/2024 1114   EOSABS 0.1 11/08/2023 1218   BASOSABS 0.0 07/27/2024 1114   BASOSABS 0.0 11/08/2023 1218   BASOSABS 0.0 01/27/2016 1005    CMP  Component Value Date/Time   NA 138 07/27/2024 1114   NA 140 05/30/2024 1058   NA 141 01/27/2016 1005   K 3.8 07/27/2024 1114   K 4.1 01/27/2016 1005   CL 108 07/27/2024 1114   CO2 28 07/27/2024 1114   CO2 24 01/27/2016 1005   GLUCOSE 119 (H) 07/27/2024 1114   GLUCOSE 98 01/27/2016 1005   BUN 21 07/27/2024 1114   BUN 12 05/30/2024 1058   BUN 15.0 01/27/2016 1005   CREATININE 1.48 07/27/2024 1114   CREATININE 1.2 01/27/2016 1005   CALCIUM 8.9 07/27/2024 1114   CALCIUM 8.8 01/27/2016 1005   PROT 7.2 07/27/2024 1114   PROT 6.2 05/30/2024 1058   PROT 7.8 01/27/2016 1005   ALBUMIN 3.1 (L) 07/27/2024 1114   ALBUMIN 2.9 (L) 05/30/2024 1058   ALBUMIN 3.2 (L) 01/27/2016 1005   AST 36 07/27/2024 1114   AST 46 (H) 01/27/2016 1005   ALT 17 07/27/2024 1114   ALT 30 01/27/2016 1005   ALKPHOS 58 07/27/2024 1114   ALKPHOS 93 01/27/2016  1005   BILITOT 1.1 07/27/2024 1114   BILITOT 1.2 05/30/2024 1058   BILITOT 0.71 01/27/2016 1005   GFRNONAA 45 (L) 10/24/2022 0050   GFRAA 71 07/30/2020 1148     Assessment and Plan:   ABUBAKR WIEMAN is a 64 y.o. y/o male returns for follow-up of:  1.  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 Zamor June 2026.  2.  Esophageal varices stable s/p banding - Continue carvedilol  3.125 mg daily - 1 year repeat EGD will be due 02/2025 for surveillance  3.  Colon cancer screening is up-to-date; history of adenomatous colon polyp - 7-year repeat colonoscopy will be due 02/2031.    Ellouise Console, PA-C  Follow up in 6 months with Dr. Albertus.   "

## 2024-07-24 ENCOUNTER — Other Ambulatory Visit: Payer: Self-pay

## 2024-07-27 ENCOUNTER — Encounter: Payer: Self-pay | Admitting: Physician Assistant

## 2024-07-27 ENCOUNTER — Other Ambulatory Visit

## 2024-07-27 ENCOUNTER — Telehealth: Payer: Self-pay

## 2024-07-27 ENCOUNTER — Ambulatory Visit: Payer: Self-pay | Admitting: Physician Assistant

## 2024-07-27 ENCOUNTER — Ambulatory Visit: Admitting: Physician Assistant

## 2024-07-27 VITALS — BP 102/60 | HR 66 | Ht 73.5 in | Wt 187.0 lb

## 2024-07-27 DIAGNOSIS — D72819 Decreased white blood cell count, unspecified: Secondary | ICD-10-CM

## 2024-07-27 DIAGNOSIS — K7460 Unspecified cirrhosis of liver: Secondary | ICD-10-CM | POA: Diagnosis not present

## 2024-07-27 DIAGNOSIS — K7290 Hepatic failure, unspecified without coma: Secondary | ICD-10-CM | POA: Diagnosis not present

## 2024-07-27 DIAGNOSIS — K754 Autoimmune hepatitis: Secondary | ICD-10-CM

## 2024-07-27 DIAGNOSIS — I8500 Esophageal varices without bleeding: Secondary | ICD-10-CM | POA: Diagnosis not present

## 2024-07-27 DIAGNOSIS — Z860101 Personal history of adenomatous and serrated colon polyps: Secondary | ICD-10-CM | POA: Diagnosis not present

## 2024-07-27 DIAGNOSIS — D696 Thrombocytopenia, unspecified: Secondary | ICD-10-CM

## 2024-07-27 DIAGNOSIS — I8510 Secondary esophageal varices without bleeding: Secondary | ICD-10-CM

## 2024-07-27 LAB — COMPREHENSIVE METABOLIC PANEL WITH GFR
ALT: 17 U/L (ref 3–53)
AST: 36 U/L (ref 5–37)
Albumin: 3.1 g/dL — ABNORMAL LOW (ref 3.5–5.2)
Alkaline Phosphatase: 58 U/L (ref 39–117)
BUN: 21 mg/dL (ref 6–23)
CO2: 28 meq/L (ref 19–32)
Calcium: 8.9 mg/dL (ref 8.4–10.5)
Chloride: 108 meq/L (ref 96–112)
Creatinine, Ser: 1.48 mg/dL (ref 0.40–1.50)
GFR: 49.7 mL/min — ABNORMAL LOW
Glucose, Bld: 119 mg/dL — ABNORMAL HIGH (ref 70–99)
Potassium: 3.8 meq/L (ref 3.5–5.1)
Sodium: 138 meq/L (ref 135–145)
Total Bilirubin: 1.1 mg/dL (ref 0.2–1.2)
Total Protein: 7.2 g/dL (ref 6.0–8.3)

## 2024-07-27 LAB — CBC WITH DIFFERENTIAL/PLATELET
Basophils Absolute: 0 K/uL (ref 0.0–0.1)
Basophils Relative: 1.3 % (ref 0.0–3.0)
Eosinophils Absolute: 0.1 K/uL (ref 0.0–0.7)
Eosinophils Relative: 5.5 % — ABNORMAL HIGH (ref 0.0–5.0)
HCT: 30.9 % — ABNORMAL LOW (ref 39.0–52.0)
Hemoglobin: 10.7 g/dL — ABNORMAL LOW (ref 13.0–17.0)
Lymphocytes Relative: 26.7 % (ref 12.0–46.0)
Lymphs Abs: 0.4 K/uL — ABNORMAL LOW (ref 0.7–4.0)
MCHC: 34.7 g/dL (ref 30.0–36.0)
MCV: 99.9 fl (ref 78.0–100.0)
Monocytes Absolute: 0.2 K/uL (ref 0.1–1.0)
Monocytes Relative: 17.2 % — ABNORMAL HIGH (ref 3.0–12.0)
Neutro Abs: 0.7 K/uL — ABNORMAL LOW (ref 1.4–7.7)
Neutrophils Relative %: 49.3 % (ref 43.0–77.0)
Platelets: 43 K/uL — CL (ref 150.0–400.0)
RBC: 3.1 Mil/uL — ABNORMAL LOW (ref 4.22–5.81)
RDW: 17.3 % — ABNORMAL HIGH (ref 11.5–15.5)
WBC: 1.4 K/uL — CL (ref 4.0–10.5)

## 2024-07-27 LAB — PROTIME-INR
INR: 1.8 ratio — ABNORMAL HIGH (ref 0.8–1.0)
Prothrombin Time: 19.4 s — ABNORMAL HIGH (ref 9.6–13.1)

## 2024-07-27 NOTE — Patient Instructions (Signed)
 Your provider has requested that you go to the basement level for lab work before leaving today. Press B on the elevator. The lab is located at the first door on the left as you exit the elevator.  Due to recent changes in healthcare laws, you may see the results of your imaging and laboratory studies on MyChart before your provider has had a chance to review them.  We understand that in some cases there may be results that are confusing or concerning to you. Not all laboratory results come back in the same time frame and the provider may be waiting for multiple results in order to interpret others.  Please give us  48 hours in order for your provider to thoroughly review all the results before contacting the office for clarification of your results.   We are providing you with a low sodium diet handout and also a Autoimmune Hepatitis handout to read.  I appreciate the opportunity to care for you. Ellouise Console, PA

## 2024-07-27 NOTE — Telephone Encounter (Signed)
 Took call from the lab for critical labs  WBC 1.4 Platelet 43

## 2024-07-28 LAB — AFP TUMOR MARKER: AFP-Tumor Marker: 4.1 ng/mL

## 2024-07-31 ENCOUNTER — Telehealth: Payer: Self-pay

## 2024-07-31 DIAGNOSIS — K754 Autoimmune hepatitis: Secondary | ICD-10-CM

## 2024-07-31 NOTE — Progress Notes (Signed)
 Complex Care Management Note  Care Guide Note 07/31/2024 Name: Timothy [name] MRN: [id number] DOB: May 19, 1961  Timothy [name] is a 64 y.o. year old male who sees Delbert Clam, MD for primary care. I reached out to Timothy [name] by phone today to offer complex care management services.  [name] was given information about Complex Care Management services today including:   The Complex Care Management services include support from the care team which includes your Nurse Care Manager, Clinical Social Worker, or Pharmacist.  The Complex Care Management team is here to help remove barriers to the health concerns and goals most important to you. Complex Care Management services are voluntary, and the patient may decline or stop services at any time by request to their care team member.   Complex Care Management Consent Status: Patient agreed to services and verbal consent obtained.   Encounter Outcome:  Patient Refused  [place] Agent [facility], [facility] Assistant Direct Dial: [phone]  Fax: [phone]

## 2024-07-31 NOTE — Progress Notes (Signed)
 MELD score 17. I sent patient lab results through MyChart. I put in future orders for repeat labs in 2 weeks. Ellouise Console, PA-C

## 2024-08-09 ENCOUNTER — Telehealth: Payer: Self-pay | Admitting: Internal Medicine

## 2024-08-09 NOTE — Telephone Encounter (Signed)
 Patient is due for repeat labs 08/14/24. Orders are already in [facility] for this. Patient is advised and verbalizes understanding.

## 2024-08-09 NOTE — Telephone Encounter (Signed)
 Incoming call from pt requesting to get labs completed. Pt stated he was informed to complete labs after last visit on 01/22 but missed it. Pt asking can he come for labs now? Please advise. Thank you.

## 2024-11-28 ENCOUNTER — Ambulatory Visit: Admitting: Family Medicine
